# Patient Record
Sex: Female | Born: 1945 | Race: Black or African American | Hispanic: No | Marital: Married | Smoking: Never smoker
Health system: Southern US, Community
[De-identification: ages and names within clinical notes are randomized; demographics above are authoritative.]

## PROBLEM LIST (undated history)

## (undated) DIAGNOSIS — F419 Anxiety disorder, unspecified: Secondary | ICD-10-CM

## (undated) DIAGNOSIS — E119 Type 2 diabetes mellitus without complications: Secondary | ICD-10-CM

## (undated) DIAGNOSIS — I10 Essential (primary) hypertension: Secondary | ICD-10-CM

## (undated) DIAGNOSIS — E039 Hypothyroidism, unspecified: Secondary | ICD-10-CM

## (undated) DIAGNOSIS — E785 Hyperlipidemia, unspecified: Secondary | ICD-10-CM

## (undated) DIAGNOSIS — F32A Depression, unspecified: Secondary | ICD-10-CM

## (undated) DIAGNOSIS — F329 Major depressive disorder, single episode, unspecified: Secondary | ICD-10-CM

## (undated) HISTORY — DX: Depression, unspecified: F32.A

## (undated) HISTORY — DX: Essential (primary) hypertension: I10

## (undated) HISTORY — DX: Anxiety disorder, unspecified: F41.9

## (undated) HISTORY — PX: BREAST BIOPSY: SHX20

## (undated) HISTORY — DX: Type 2 diabetes mellitus without complications: E11.9

## (undated) HISTORY — DX: Hyperlipidemia, unspecified: E78.5

## (undated) HISTORY — DX: Major depressive disorder, single episode, unspecified: F32.9

---

## 2004-09-10 ENCOUNTER — Ambulatory Visit: Payer: Self-pay | Admitting: Family Medicine

## 2005-09-14 ENCOUNTER — Ambulatory Visit: Payer: Self-pay | Admitting: Family Medicine

## 2006-09-22 ENCOUNTER — Ambulatory Visit: Payer: Self-pay | Admitting: Family Medicine

## 2007-10-03 ENCOUNTER — Ambulatory Visit: Payer: Self-pay | Admitting: Internal Medicine

## 2007-10-06 ENCOUNTER — Ambulatory Visit: Payer: Self-pay | Admitting: Internal Medicine

## 2008-12-04 ENCOUNTER — Ambulatory Visit: Payer: Self-pay | Admitting: Internal Medicine

## 2009-12-05 ENCOUNTER — Ambulatory Visit: Payer: Self-pay | Admitting: Internal Medicine

## 2009-12-05 LAB — HM COLONOSCOPY: HM COLON: NORMAL

## 2009-12-22 ENCOUNTER — Ambulatory Visit: Payer: Self-pay | Admitting: Unknown Physician Specialty

## 2010-09-15 ENCOUNTER — Ambulatory Visit: Payer: Self-pay | Admitting: Family Medicine

## 2011-10-05 ENCOUNTER — Ambulatory Visit: Payer: Self-pay | Admitting: Family Medicine

## 2012-01-11 DIAGNOSIS — E039 Hypothyroidism, unspecified: Secondary | ICD-10-CM | POA: Diagnosis not present

## 2012-02-14 DIAGNOSIS — E119 Type 2 diabetes mellitus without complications: Secondary | ICD-10-CM | POA: Diagnosis not present

## 2012-03-07 DIAGNOSIS — Z0000 Encounter for general adult medical examination without abnormal findings: Secondary | ICD-10-CM | POA: Diagnosis not present

## 2012-03-07 DIAGNOSIS — Z23 Encounter for immunization: Secondary | ICD-10-CM | POA: Diagnosis not present

## 2012-03-07 DIAGNOSIS — Z1159 Encounter for screening for other viral diseases: Secondary | ICD-10-CM | POA: Diagnosis not present

## 2012-03-07 DIAGNOSIS — Z1331 Encounter for screening for depression: Secondary | ICD-10-CM | POA: Diagnosis not present

## 2012-03-07 DIAGNOSIS — E039 Hypothyroidism, unspecified: Secondary | ICD-10-CM | POA: Diagnosis not present

## 2012-03-07 DIAGNOSIS — I10 Essential (primary) hypertension: Secondary | ICD-10-CM | POA: Diagnosis not present

## 2012-03-07 DIAGNOSIS — Z124 Encounter for screening for malignant neoplasm of cervix: Secondary | ICD-10-CM | POA: Diagnosis not present

## 2012-03-07 LAB — HM PAP SMEAR: HM Pap smear: NORMAL

## 2012-03-22 ENCOUNTER — Ambulatory Visit: Payer: Self-pay | Admitting: Family Medicine

## 2012-03-22 DIAGNOSIS — N959 Unspecified menopausal and perimenopausal disorder: Secondary | ICD-10-CM | POA: Diagnosis not present

## 2012-03-22 DIAGNOSIS — N951 Menopausal and female climacteric states: Secondary | ICD-10-CM | POA: Diagnosis not present

## 2012-04-07 DIAGNOSIS — E785 Hyperlipidemia, unspecified: Secondary | ICD-10-CM | POA: Diagnosis not present

## 2012-04-07 DIAGNOSIS — I10 Essential (primary) hypertension: Secondary | ICD-10-CM | POA: Diagnosis not present

## 2012-04-07 DIAGNOSIS — E1129 Type 2 diabetes mellitus with other diabetic kidney complication: Secondary | ICD-10-CM | POA: Diagnosis not present

## 2012-08-07 DIAGNOSIS — E039 Hypothyroidism, unspecified: Secondary | ICD-10-CM | POA: Diagnosis not present

## 2012-08-07 DIAGNOSIS — E1129 Type 2 diabetes mellitus with other diabetic kidney complication: Secondary | ICD-10-CM | POA: Diagnosis not present

## 2012-08-07 DIAGNOSIS — I10 Essential (primary) hypertension: Secondary | ICD-10-CM | POA: Diagnosis not present

## 2012-08-07 DIAGNOSIS — E785 Hyperlipidemia, unspecified: Secondary | ICD-10-CM | POA: Diagnosis not present

## 2012-10-05 ENCOUNTER — Ambulatory Visit: Payer: Self-pay | Admitting: Family Medicine

## 2012-10-05 DIAGNOSIS — Z1231 Encounter for screening mammogram for malignant neoplasm of breast: Secondary | ICD-10-CM | POA: Diagnosis not present

## 2012-10-26 DIAGNOSIS — E785 Hyperlipidemia, unspecified: Secondary | ICD-10-CM | POA: Diagnosis not present

## 2012-10-26 DIAGNOSIS — E1129 Type 2 diabetes mellitus with other diabetic kidney complication: Secondary | ICD-10-CM | POA: Diagnosis not present

## 2012-10-26 DIAGNOSIS — I10 Essential (primary) hypertension: Secondary | ICD-10-CM | POA: Diagnosis not present

## 2012-11-08 DIAGNOSIS — E1129 Type 2 diabetes mellitus with other diabetic kidney complication: Secondary | ICD-10-CM | POA: Diagnosis not present

## 2012-12-11 DIAGNOSIS — B0229 Other postherpetic nervous system involvement: Secondary | ICD-10-CM | POA: Diagnosis not present

## 2012-12-11 DIAGNOSIS — Z1331 Encounter for screening for depression: Secondary | ICD-10-CM | POA: Diagnosis not present

## 2012-12-11 DIAGNOSIS — R439 Unspecified disturbances of smell and taste: Secondary | ICD-10-CM | POA: Diagnosis not present

## 2012-12-11 DIAGNOSIS — F341 Dysthymic disorder: Secondary | ICD-10-CM | POA: Diagnosis not present

## 2013-01-03 DIAGNOSIS — R439 Unspecified disturbances of smell and taste: Secondary | ICD-10-CM | POA: Diagnosis not present

## 2013-01-03 DIAGNOSIS — H6120 Impacted cerumen, unspecified ear: Secondary | ICD-10-CM | POA: Diagnosis not present

## 2013-01-24 DIAGNOSIS — R439 Unspecified disturbances of smell and taste: Secondary | ICD-10-CM | POA: Diagnosis not present

## 2013-02-15 DIAGNOSIS — E119 Type 2 diabetes mellitus without complications: Secondary | ICD-10-CM | POA: Diagnosis not present

## 2013-02-27 DIAGNOSIS — Z23 Encounter for immunization: Secondary | ICD-10-CM | POA: Diagnosis not present

## 2013-02-27 DIAGNOSIS — I10 Essential (primary) hypertension: Secondary | ICD-10-CM | POA: Diagnosis not present

## 2013-02-27 DIAGNOSIS — E1129 Type 2 diabetes mellitus with other diabetic kidney complication: Secondary | ICD-10-CM | POA: Diagnosis not present

## 2013-02-27 DIAGNOSIS — N058 Unspecified nephritic syndrome with other morphologic changes: Secondary | ICD-10-CM | POA: Diagnosis not present

## 2013-04-26 DIAGNOSIS — R439 Unspecified disturbances of smell and taste: Secondary | ICD-10-CM | POA: Diagnosis not present

## 2013-05-07 ENCOUNTER — Ambulatory Visit: Payer: Self-pay | Admitting: Unknown Physician Specialty

## 2013-05-07 DIAGNOSIS — R439 Unspecified disturbances of smell and taste: Secondary | ICD-10-CM | POA: Diagnosis not present

## 2013-05-07 DIAGNOSIS — R930 Abnormal findings on diagnostic imaging of skull and head, not elsewhere classified: Secondary | ICD-10-CM | POA: Diagnosis not present

## 2013-05-07 LAB — CREATININE, SERUM
Creatinine: 1.28 mg/dL (ref 0.60–1.30)
EGFR (African American): 50 — ABNORMAL LOW
EGFR (Non-African Amer.): 44 — ABNORMAL LOW

## 2013-06-12 DIAGNOSIS — I10 Essential (primary) hypertension: Secondary | ICD-10-CM | POA: Diagnosis not present

## 2013-06-12 DIAGNOSIS — E1129 Type 2 diabetes mellitus with other diabetic kidney complication: Secondary | ICD-10-CM | POA: Diagnosis not present

## 2013-06-12 DIAGNOSIS — F341 Dysthymic disorder: Secondary | ICD-10-CM | POA: Diagnosis not present

## 2013-06-12 DIAGNOSIS — E039 Hypothyroidism, unspecified: Secondary | ICD-10-CM | POA: Diagnosis not present

## 2013-06-12 DIAGNOSIS — E785 Hyperlipidemia, unspecified: Secondary | ICD-10-CM | POA: Diagnosis not present

## 2013-06-12 DIAGNOSIS — N058 Unspecified nephritic syndrome with other morphologic changes: Secondary | ICD-10-CM | POA: Diagnosis not present

## 2013-09-06 LAB — HM DEXA SCAN: HM Dexa Scan: NORMAL

## 2013-10-09 ENCOUNTER — Ambulatory Visit: Payer: Self-pay | Admitting: Family Medicine

## 2013-10-09 DIAGNOSIS — Z1231 Encounter for screening mammogram for malignant neoplasm of breast: Secondary | ICD-10-CM | POA: Diagnosis not present

## 2013-10-09 LAB — HM MAMMOGRAPHY: HM Mammogram: NORMAL

## 2013-10-10 DIAGNOSIS — E039 Hypothyroidism, unspecified: Secondary | ICD-10-CM | POA: Diagnosis not present

## 2013-10-10 DIAGNOSIS — R059 Cough, unspecified: Secondary | ICD-10-CM | POA: Diagnosis not present

## 2013-10-10 DIAGNOSIS — R6881 Early satiety: Secondary | ICD-10-CM | POA: Diagnosis not present

## 2013-10-10 DIAGNOSIS — E1129 Type 2 diabetes mellitus with other diabetic kidney complication: Secondary | ICD-10-CM | POA: Diagnosis not present

## 2013-10-10 DIAGNOSIS — R110 Nausea: Secondary | ICD-10-CM | POA: Diagnosis not present

## 2013-10-10 DIAGNOSIS — R05 Cough: Secondary | ICD-10-CM | POA: Diagnosis not present

## 2013-10-12 DIAGNOSIS — E1129 Type 2 diabetes mellitus with other diabetic kidney complication: Secondary | ICD-10-CM | POA: Diagnosis not present

## 2013-10-12 DIAGNOSIS — N058 Unspecified nephritic syndrome with other morphologic changes: Secondary | ICD-10-CM | POA: Diagnosis not present

## 2013-10-12 DIAGNOSIS — I10 Essential (primary) hypertension: Secondary | ICD-10-CM | POA: Diagnosis not present

## 2013-11-02 DIAGNOSIS — E119 Type 2 diabetes mellitus without complications: Secondary | ICD-10-CM | POA: Diagnosis not present

## 2013-11-02 DIAGNOSIS — I10 Essential (primary) hypertension: Secondary | ICD-10-CM | POA: Diagnosis not present

## 2013-11-02 DIAGNOSIS — N179 Acute kidney failure, unspecified: Secondary | ICD-10-CM | POA: Diagnosis not present

## 2013-11-02 DIAGNOSIS — N170 Acute kidney failure with tubular necrosis: Secondary | ICD-10-CM | POA: Diagnosis not present

## 2013-11-02 DIAGNOSIS — N200 Calculus of kidney: Secondary | ICD-10-CM | POA: Diagnosis not present

## 2013-11-06 ENCOUNTER — Ambulatory Visit: Payer: Self-pay | Admitting: Nephrology

## 2013-11-06 DIAGNOSIS — N200 Calculus of kidney: Secondary | ICD-10-CM | POA: Diagnosis not present

## 2013-11-06 DIAGNOSIS — N183 Chronic kidney disease, stage 3 unspecified: Secondary | ICD-10-CM | POA: Diagnosis not present

## 2013-11-06 DIAGNOSIS — N189 Chronic kidney disease, unspecified: Secondary | ICD-10-CM | POA: Diagnosis not present

## 2013-11-16 DIAGNOSIS — N179 Acute kidney failure, unspecified: Secondary | ICD-10-CM | POA: Diagnosis not present

## 2013-11-16 DIAGNOSIS — N200 Calculus of kidney: Secondary | ICD-10-CM | POA: Diagnosis not present

## 2013-11-16 DIAGNOSIS — E119 Type 2 diabetes mellitus without complications: Secondary | ICD-10-CM | POA: Diagnosis not present

## 2013-11-28 DIAGNOSIS — N200 Calculus of kidney: Secondary | ICD-10-CM | POA: Diagnosis not present

## 2013-11-28 DIAGNOSIS — R109 Unspecified abdominal pain: Secondary | ICD-10-CM | POA: Diagnosis not present

## 2013-12-05 HISTORY — PX: LITHOTRIPSY: SUR834

## 2013-12-10 ENCOUNTER — Ambulatory Visit: Payer: Self-pay | Admitting: Urology

## 2013-12-10 DIAGNOSIS — N200 Calculus of kidney: Secondary | ICD-10-CM | POA: Diagnosis not present

## 2013-12-20 DIAGNOSIS — N200 Calculus of kidney: Secondary | ICD-10-CM | POA: Diagnosis not present

## 2013-12-20 DIAGNOSIS — Z01818 Encounter for other preprocedural examination: Secondary | ICD-10-CM | POA: Diagnosis not present

## 2014-01-01 ENCOUNTER — Ambulatory Visit: Payer: Self-pay | Admitting: Urology

## 2014-01-01 DIAGNOSIS — E119 Type 2 diabetes mellitus without complications: Secondary | ICD-10-CM | POA: Diagnosis not present

## 2014-01-01 DIAGNOSIS — Z01812 Encounter for preprocedural laboratory examination: Secondary | ICD-10-CM | POA: Diagnosis not present

## 2014-01-01 DIAGNOSIS — Z01810 Encounter for preprocedural cardiovascular examination: Secondary | ICD-10-CM | POA: Diagnosis not present

## 2014-01-01 DIAGNOSIS — I10 Essential (primary) hypertension: Secondary | ICD-10-CM | POA: Diagnosis not present

## 2014-01-01 DIAGNOSIS — N200 Calculus of kidney: Secondary | ICD-10-CM | POA: Diagnosis not present

## 2014-01-01 LAB — BASIC METABOLIC PANEL
ANION GAP: 7 (ref 7–16)
BUN: 16 mg/dL (ref 7–18)
CREATININE: 0.9 mg/dL (ref 0.60–1.30)
Calcium, Total: 9.2 mg/dL (ref 8.5–10.1)
Chloride: 103 mmol/L (ref 98–107)
Co2: 27 mmol/L (ref 21–32)
Glucose: 71 mg/dL (ref 65–99)
OSMOLALITY: 273 (ref 275–301)
Potassium: 3.7 mmol/L (ref 3.5–5.1)
Sodium: 137 mmol/L (ref 136–145)

## 2014-01-03 ENCOUNTER — Ambulatory Visit: Payer: Self-pay | Admitting: Urology

## 2014-01-03 DIAGNOSIS — Z882 Allergy status to sulfonamides status: Secondary | ICD-10-CM | POA: Diagnosis not present

## 2014-01-03 DIAGNOSIS — I129 Hypertensive chronic kidney disease with stage 1 through stage 4 chronic kidney disease, or unspecified chronic kidney disease: Secondary | ICD-10-CM | POA: Diagnosis not present

## 2014-01-03 DIAGNOSIS — F411 Generalized anxiety disorder: Secondary | ICD-10-CM | POA: Diagnosis not present

## 2014-01-03 DIAGNOSIS — N200 Calculus of kidney: Secondary | ICD-10-CM | POA: Diagnosis not present

## 2014-01-03 DIAGNOSIS — E039 Hypothyroidism, unspecified: Secondary | ICD-10-CM | POA: Diagnosis not present

## 2014-01-03 DIAGNOSIS — Z79899 Other long term (current) drug therapy: Secondary | ICD-10-CM | POA: Diagnosis not present

## 2014-01-03 DIAGNOSIS — E119 Type 2 diabetes mellitus without complications: Secondary | ICD-10-CM | POA: Diagnosis not present

## 2014-01-03 DIAGNOSIS — N189 Chronic kidney disease, unspecified: Secondary | ICD-10-CM | POA: Diagnosis not present

## 2014-01-10 ENCOUNTER — Ambulatory Visit: Payer: Self-pay | Admitting: Urology

## 2014-01-10 DIAGNOSIS — N201 Calculus of ureter: Secondary | ICD-10-CM | POA: Diagnosis not present

## 2014-01-10 DIAGNOSIS — N200 Calculus of kidney: Secondary | ICD-10-CM | POA: Diagnosis not present

## 2014-01-30 DIAGNOSIS — Z23 Encounter for immunization: Secondary | ICD-10-CM | POA: Diagnosis not present

## 2014-01-30 DIAGNOSIS — E1129 Type 2 diabetes mellitus with other diabetic kidney complication: Secondary | ICD-10-CM | POA: Diagnosis not present

## 2014-01-30 DIAGNOSIS — N058 Unspecified nephritic syndrome with other morphologic changes: Secondary | ICD-10-CM | POA: Diagnosis not present

## 2014-01-30 DIAGNOSIS — N183 Chronic kidney disease, stage 3 unspecified: Secondary | ICD-10-CM | POA: Diagnosis not present

## 2014-02-12 ENCOUNTER — Ambulatory Visit: Payer: Self-pay | Admitting: Urology

## 2014-02-12 DIAGNOSIS — N201 Calculus of ureter: Secondary | ICD-10-CM | POA: Diagnosis not present

## 2014-02-12 DIAGNOSIS — Z01818 Encounter for other preprocedural examination: Secondary | ICD-10-CM | POA: Diagnosis not present

## 2014-02-12 DIAGNOSIS — N200 Calculus of kidney: Secondary | ICD-10-CM | POA: Diagnosis not present

## 2014-02-12 DIAGNOSIS — R109 Unspecified abdominal pain: Secondary | ICD-10-CM | POA: Diagnosis not present

## 2014-02-18 DIAGNOSIS — E119 Type 2 diabetes mellitus without complications: Secondary | ICD-10-CM | POA: Diagnosis not present

## 2014-02-28 ENCOUNTER — Ambulatory Visit: Payer: Self-pay | Admitting: Urology

## 2014-02-28 DIAGNOSIS — N201 Calculus of ureter: Secondary | ICD-10-CM | POA: Diagnosis not present

## 2014-02-28 DIAGNOSIS — E039 Hypothyroidism, unspecified: Secondary | ICD-10-CM | POA: Diagnosis not present

## 2014-02-28 DIAGNOSIS — N200 Calculus of kidney: Secondary | ICD-10-CM | POA: Diagnosis not present

## 2014-02-28 DIAGNOSIS — I10 Essential (primary) hypertension: Secondary | ICD-10-CM | POA: Diagnosis not present

## 2014-02-28 DIAGNOSIS — I129 Hypertensive chronic kidney disease with stage 1 through stage 4 chronic kidney disease, or unspecified chronic kidney disease: Secondary | ICD-10-CM | POA: Diagnosis not present

## 2014-02-28 DIAGNOSIS — N189 Chronic kidney disease, unspecified: Secondary | ICD-10-CM | POA: Diagnosis not present

## 2014-02-28 DIAGNOSIS — E119 Type 2 diabetes mellitus without complications: Secondary | ICD-10-CM | POA: Diagnosis not present

## 2014-02-28 DIAGNOSIS — Z8489 Family history of other specified conditions: Secondary | ICD-10-CM | POA: Diagnosis not present

## 2014-03-07 ENCOUNTER — Ambulatory Visit: Payer: Self-pay

## 2014-03-07 DIAGNOSIS — Z09 Encounter for follow-up examination after completed treatment for conditions other than malignant neoplasm: Secondary | ICD-10-CM | POA: Diagnosis not present

## 2014-03-07 DIAGNOSIS — N200 Calculus of kidney: Secondary | ICD-10-CM | POA: Diagnosis not present

## 2014-03-07 DIAGNOSIS — Z9889 Other specified postprocedural states: Secondary | ICD-10-CM | POA: Diagnosis not present

## 2014-03-20 DIAGNOSIS — N200 Calculus of kidney: Secondary | ICD-10-CM | POA: Diagnosis not present

## 2014-03-20 DIAGNOSIS — I129 Hypertensive chronic kidney disease with stage 1 through stage 4 chronic kidney disease, or unspecified chronic kidney disease: Secondary | ICD-10-CM | POA: Diagnosis not present

## 2014-03-20 DIAGNOSIS — E785 Hyperlipidemia, unspecified: Secondary | ICD-10-CM | POA: Diagnosis not present

## 2014-03-20 DIAGNOSIS — I10 Essential (primary) hypertension: Secondary | ICD-10-CM | POA: Diagnosis not present

## 2014-03-20 DIAGNOSIS — E119 Type 2 diabetes mellitus without complications: Secondary | ICD-10-CM | POA: Diagnosis not present

## 2014-03-20 DIAGNOSIS — E039 Hypothyroidism, unspecified: Secondary | ICD-10-CM | POA: Diagnosis not present

## 2014-03-25 DIAGNOSIS — N950 Postmenopausal bleeding: Secondary | ICD-10-CM | POA: Diagnosis not present

## 2014-03-25 DIAGNOSIS — Z1389 Encounter for screening for other disorder: Secondary | ICD-10-CM | POA: Diagnosis not present

## 2014-03-25 DIAGNOSIS — Z9181 History of falling: Secondary | ICD-10-CM | POA: Diagnosis not present

## 2014-03-25 DIAGNOSIS — E785 Hyperlipidemia, unspecified: Secondary | ICD-10-CM | POA: Diagnosis not present

## 2014-03-25 DIAGNOSIS — F418 Other specified anxiety disorders: Secondary | ICD-10-CM | POA: Diagnosis not present

## 2014-03-25 DIAGNOSIS — E1121 Type 2 diabetes mellitus with diabetic nephropathy: Secondary | ICD-10-CM | POA: Diagnosis not present

## 2014-03-25 DIAGNOSIS — Z0000 Encounter for general adult medical examination without abnormal findings: Secondary | ICD-10-CM | POA: Diagnosis not present

## 2014-04-09 ENCOUNTER — Ambulatory Visit: Payer: Self-pay

## 2014-04-09 DIAGNOSIS — N200 Calculus of kidney: Secondary | ICD-10-CM | POA: Diagnosis not present

## 2014-04-17 DIAGNOSIS — N898 Other specified noninflammatory disorders of vagina: Secondary | ICD-10-CM | POA: Diagnosis not present

## 2014-04-23 DIAGNOSIS — N898 Other specified noninflammatory disorders of vagina: Secondary | ICD-10-CM | POA: Diagnosis not present

## 2014-05-07 DIAGNOSIS — R809 Proteinuria, unspecified: Secondary | ICD-10-CM | POA: Diagnosis not present

## 2014-05-07 DIAGNOSIS — E039 Hypothyroidism, unspecified: Secondary | ICD-10-CM | POA: Diagnosis not present

## 2014-05-07 DIAGNOSIS — I10 Essential (primary) hypertension: Secondary | ICD-10-CM | POA: Diagnosis not present

## 2014-05-07 DIAGNOSIS — E1121 Type 2 diabetes mellitus with diabetic nephropathy: Secondary | ICD-10-CM | POA: Diagnosis not present

## 2014-05-07 DIAGNOSIS — F418 Other specified anxiety disorders: Secondary | ICD-10-CM | POA: Diagnosis not present

## 2014-05-07 DIAGNOSIS — N183 Chronic kidney disease, stage 3 (moderate): Secondary | ICD-10-CM | POA: Diagnosis not present

## 2014-08-08 DIAGNOSIS — E785 Hyperlipidemia, unspecified: Secondary | ICD-10-CM | POA: Diagnosis not present

## 2014-08-08 DIAGNOSIS — R809 Proteinuria, unspecified: Secondary | ICD-10-CM | POA: Diagnosis not present

## 2014-08-08 DIAGNOSIS — N183 Chronic kidney disease, stage 3 (moderate): Secondary | ICD-10-CM | POA: Diagnosis not present

## 2014-08-08 DIAGNOSIS — Z23 Encounter for immunization: Secondary | ICD-10-CM | POA: Diagnosis not present

## 2014-08-08 DIAGNOSIS — I10 Essential (primary) hypertension: Secondary | ICD-10-CM | POA: Diagnosis not present

## 2014-08-08 DIAGNOSIS — M7551 Bursitis of right shoulder: Secondary | ICD-10-CM | POA: Diagnosis not present

## 2014-08-08 DIAGNOSIS — E039 Hypothyroidism, unspecified: Secondary | ICD-10-CM | POA: Diagnosis not present

## 2014-08-08 DIAGNOSIS — E1121 Type 2 diabetes mellitus with diabetic nephropathy: Secondary | ICD-10-CM | POA: Diagnosis not present

## 2014-08-08 LAB — BASIC METABOLIC PANEL
Creatinine: 1.1 mg/dL (ref <=1.1)
Glucose: 104 mg/dL

## 2014-08-08 LAB — TSH: TSH: 4.41 u[IU]/mL (ref <=5.90)

## 2014-08-08 LAB — LIPID PANEL
CHOLESTEROL: 127 mg/dL (ref 0–200)
HDL: 35 mg/dL (ref 35–70)
LDL CALC: 72 mg/dL
Triglycerides: 98 mg/dL (ref 40–160)

## 2014-08-08 LAB — HEPATIC FUNCTION PANEL
ALK PHOS: 133 U/L — AB (ref 25–125)
ALT: 15 U/L (ref 7–35)
AST: 36 U/L — AB (ref 13–35)

## 2014-08-08 LAB — HEMOGLOBIN A1C: Hgb A1c MFr Bld: 6.8 % — AB (ref 4.0–6.0)

## 2014-09-07 DIAGNOSIS — E785 Hyperlipidemia, unspecified: Secondary | ICD-10-CM | POA: Insufficient documentation

## 2014-09-07 DIAGNOSIS — E559 Vitamin D deficiency, unspecified: Secondary | ICD-10-CM | POA: Insufficient documentation

## 2014-09-07 DIAGNOSIS — R110 Nausea: Secondary | ICD-10-CM | POA: Insufficient documentation

## 2014-09-07 DIAGNOSIS — F325 Major depressive disorder, single episode, in full remission: Secondary | ICD-10-CM | POA: Insufficient documentation

## 2014-09-07 DIAGNOSIS — R413 Other amnesia: Secondary | ICD-10-CM | POA: Insufficient documentation

## 2014-09-07 DIAGNOSIS — N183 Chronic kidney disease, stage 3 unspecified: Secondary | ICD-10-CM | POA: Insufficient documentation

## 2014-09-07 DIAGNOSIS — E1129 Type 2 diabetes mellitus with other diabetic kidney complication: Secondary | ICD-10-CM | POA: Insufficient documentation

## 2014-09-07 DIAGNOSIS — E039 Hypothyroidism, unspecified: Secondary | ICD-10-CM | POA: Insufficient documentation

## 2014-09-07 DIAGNOSIS — R809 Proteinuria, unspecified: Secondary | ICD-10-CM | POA: Insufficient documentation

## 2014-09-07 DIAGNOSIS — B0229 Other postherpetic nervous system involvement: Secondary | ICD-10-CM | POA: Insufficient documentation

## 2014-09-07 DIAGNOSIS — N1831 Chronic kidney disease, stage 3a: Secondary | ICD-10-CM | POA: Insufficient documentation

## 2014-09-07 DIAGNOSIS — R430 Anosmia: Secondary | ICD-10-CM | POA: Insufficient documentation

## 2014-09-07 DIAGNOSIS — I10 Essential (primary) hypertension: Secondary | ICD-10-CM | POA: Insufficient documentation

## 2014-09-07 DIAGNOSIS — N200 Calculus of kidney: Secondary | ICD-10-CM | POA: Insufficient documentation

## 2014-09-13 DIAGNOSIS — N200 Calculus of kidney: Secondary | ICD-10-CM | POA: Diagnosis not present

## 2014-09-13 DIAGNOSIS — E119 Type 2 diabetes mellitus without complications: Secondary | ICD-10-CM | POA: Diagnosis not present

## 2014-09-13 DIAGNOSIS — I10 Essential (primary) hypertension: Secondary | ICD-10-CM | POA: Diagnosis not present

## 2014-09-20 ENCOUNTER — Other Ambulatory Visit: Payer: Self-pay | Admitting: Family Medicine

## 2014-09-20 DIAGNOSIS — Z1239 Encounter for other screening for malignant neoplasm of breast: Secondary | ICD-10-CM

## 2014-10-14 ENCOUNTER — Other Ambulatory Visit: Payer: Self-pay | Admitting: Family Medicine

## 2014-10-14 ENCOUNTER — Ambulatory Visit
Admission: RE | Admit: 2014-10-14 | Discharge: 2014-10-14 | Disposition: A | Discharge status: Home / Self Care | Payer: Medicare Other | Source: Ambulatory Visit | Attending: Family Medicine | Admitting: Family Medicine

## 2014-10-14 DIAGNOSIS — Z1231 Encounter for screening mammogram for malignant neoplasm of breast: Secondary | ICD-10-CM | POA: Diagnosis present

## 2014-10-14 DIAGNOSIS — Z1239 Encounter for other screening for malignant neoplasm of breast: Secondary | ICD-10-CM

## 2014-10-18 DIAGNOSIS — B309 Viral conjunctivitis, unspecified: Secondary | ICD-10-CM | POA: Diagnosis not present

## 2014-11-12 ENCOUNTER — Ambulatory Visit (INDEPENDENT_AMBULATORY_CARE_PROVIDER_SITE_OTHER): Payer: Medicare Other | Admitting: Family Medicine

## 2014-11-12 ENCOUNTER — Encounter (INDEPENDENT_AMBULATORY_CARE_PROVIDER_SITE_OTHER): Payer: Self-pay

## 2014-11-12 ENCOUNTER — Encounter: Payer: Self-pay | Admitting: Family Medicine

## 2014-11-12 VITALS — BP 118/78 | HR 87 | Temp 97.7°F | Resp 14 | Ht 59.5 in | Wt 153.0 lb

## 2014-11-12 DIAGNOSIS — F418 Other specified anxiety disorders: Secondary | ICD-10-CM | POA: Diagnosis not present

## 2014-11-12 DIAGNOSIS — E039 Hypothyroidism, unspecified: Secondary | ICD-10-CM

## 2014-11-12 DIAGNOSIS — F32A Depression, unspecified: Secondary | ICD-10-CM

## 2014-11-12 DIAGNOSIS — F329 Major depressive disorder, single episode, unspecified: Secondary | ICD-10-CM

## 2014-11-12 DIAGNOSIS — R809 Proteinuria, unspecified: Secondary | ICD-10-CM

## 2014-11-12 DIAGNOSIS — E669 Obesity, unspecified: Secondary | ICD-10-CM | POA: Diagnosis not present

## 2014-11-12 DIAGNOSIS — E1129 Type 2 diabetes mellitus with other diabetic kidney complication: Secondary | ICD-10-CM | POA: Diagnosis not present

## 2014-11-12 DIAGNOSIS — E66811 Obesity, class 1: Secondary | ICD-10-CM | POA: Insufficient documentation

## 2014-11-12 DIAGNOSIS — I10 Essential (primary) hypertension: Secondary | ICD-10-CM | POA: Diagnosis not present

## 2014-11-12 DIAGNOSIS — E785 Hyperlipidemia, unspecified: Secondary | ICD-10-CM

## 2014-11-12 DIAGNOSIS — F419 Anxiety disorder, unspecified: Secondary | ICD-10-CM

## 2014-11-12 LAB — POCT GLYCOSYLATED HEMOGLOBIN (HGB A1C): HEMOGLOBIN A1C: 6.5

## 2014-11-12 MED ORDER — LOSARTAN POTASSIUM 100 MG PO TABS: 100.0000 mg | ORAL_TABLET | Freq: Every day | ORAL | Status: DC

## 2014-11-12 MED ORDER — AMLODIPINE BESYLATE 2.5 MG PO TABS: 2.5000 mg | ORAL_TABLET | Freq: Every day | ORAL | Status: DC

## 2014-11-12 MED ORDER — CLONAZEPAM 0.5 MG PO TABS: 0.5000 mg | ORAL_TABLET | Freq: Two times a day (BID) | ORAL | Status: DC | PRN

## 2014-11-12 MED ORDER — LEVOTHYROXINE SODIUM 75 MCG PO TABS: 75.0000 ug | ORAL_TABLET | Freq: Every day | ORAL | Status: DC

## 2014-11-12 NOTE — Progress Notes (Signed)
Name: Megan Short   MRN: [id number]    DOB: 1945-09-26   Date:11/12/2014       Progress Note  Subjective  Chief Complaint  Chief Complaint  Patient presents with  . Medication Refill    3 month F/U  . Diabetes    Checks Sugar once a daily Low-89 Average-100 High-140  . Hyperlipidemia  . Hypertension    Checks BP at home with a brachial cuff 120/80  . Anxiety    Improving    HPI  DM: taking losartan, metformin and glipizide. No side effects, hgbA1C is coming down, denies hypoglycemic episodes. She has been compliant with her diet. 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 BP is at goal  Hyperlipidemia: LDL is at goal, reviewed labs, continue Atorvastatin, denies side effects of medications  Obesity: lost 6 lbs over the past 3 months, eating breakfast and stopped sodas.  She has also been more active.  Hypothyroidism: Denies dry skin, constipation, no fatigue, taking medication as prescribed.     Patient Active Problem List   Diagnosis Date Noted  . Chronic kidney disease (CKD), stage III (moderate) 09/07/2014  . Diabetes mellitus with renal manifestations, controlled 09/07/2014  . Adult hypothyroidism 09/07/2014  . Vitamin D deficiency 09/07/2014  . Benign essential HTN 09/07/2014  . Microalbuminuria 09/07/2014  . Dyslipidemia 09/07/2014  . Anxiety and depression 09/07/2014  . 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/07/2014  . Calculus of kidney 09/07/2014  . Memory loss or impairment 09/07/2014    Past Surgical History  Procedure Laterality Date  . Lithotripsy Left 12/2013  . Breast biopsy Right     negative times 2    Family History  Problem Relation Age of Onset  . Osteoarthritis Mother   . Osteoarthritis Sister   . Alzheimer's disease Maternal Aunt     History   Social History  . Marital Status: Married    Spouse Name: N/A  . Number of Children: N/A  . Years of Education: N/A   Occupational History  . Not on file.   Social History Main Topics  . Smoking status: Never Smoker   . Smokeless tobacco: Not on file  . Alcohol Use: No  . Drug Use: No  . Sexual Activity: No   Other Topics Concern  . Not on file   Social History Narrative     Current outpatient prescriptions:  .  amLODipine (NORVASC) 2.5 MG tablet, Take by mouth., Disp: , Rfl:  .  aspirin 81 MG tablet, Take by mouth., Disp: , Rfl:  .  atorvastatin (LIPITOR) 80 MG tablet, Take by mouth., Disp: , Rfl:  .  citalopram (CELEXA) 40 MG tablet, Take by mouth., Disp: , Rfl:  .  clonazePAM (KLONOPIN) 0.5 MG tablet, Take by mouth., Disp: , Rfl:  .  glipiZIDE (GLUCOTROL XL) 5 MG 24 hr tablet, Take by mouth., Disp: , Rfl:  .  Glucose Blood DISK, BAYER BREEZE 2 TEST (In Vitro Disk)  1 Disk check fsbs daily for 90 days  Quantity: 10;  Refills: 1   Ordered :07-Apr-2012  Alba Cory MD;  Started 07-Apr-2012 Active, Disp: , Rfl:  .  levothyroxine (SYNTHROID, LEVOTHROID) 75 MCG tablet, Take by mouth., Disp: , Rfl:  .  losartan (COZAAR) 100 MG tablet, Take by mouth., Disp: , Rfl:  .  metFORMIN (GLUCOPHAGE) 500 MG tablet, Take by  mouth., Disp: , Rfl:  .  Vitamin D, Ergocalciferol, (DRISDOL) 50000 UNITS CAPS capsule, Take by mouth., Disp: , Rfl:   Allergies  Allergen Reactions  . Sulfa Antibiotics      ROS  Constitutional: Negative for fever, she has lost 6 lbs since last visit.  Respiratory: Negative for cough and shortness of breath.   Cardiovascular: Negative for chest pain or palpitations.  Gastrointestinal: Negative for abdominal pain, no bowel changes. , but she has occasional nausea, and decrease in appetite since she started eating breakfast Musculoskeletal: Negative for gait problem or joint swelling.  Skin: Negative for rash.  Neurological: Negative for dizziness or headache. She still feels like occasionally she forgets  things at times. No other specific complaints in a complete review of systems (except as listed in HPI above).  Objective  Filed Vitals:   11/12/14 0827  BP: 118/78  Pulse: 87  Temp: 97.7 F (36.5 C)  TempSrc: Oral  Resp: 14  Height: 4' 11.5" (1.511 m)  Weight: 153 lb (69.4 kg)  SpO2: 97%    Physical Exam  Constitutional: Patient appears well-developed and well-nourished. No distress.  HENT: Head: Normocephalic and atraumatic.  Nose: Nose normal. Mouth/Throat: Oropharynx is clear and moist. No oropharyngeal exudate.  Eyes: Conjunctivae and EOM are normal. Pupils are equal, round, and reactive to light. No scleral icterus.  Neck: Normal range of motion. Neck supple. No JVD present. No thyromegaly present.  Cardiovascular: Normal rate, regular rhythm and normal heart sounds.  No murmur heard. No BLE edema. Pulmonary/Chest: Effort normal and breath sounds normal. No respiratory distress. Abdominal: Soft. Bowel sounds are normal, no distension. There is no tenderness. no masses Musculoskeletal: Normal range of motion, no joint effusions. No gross deformities Neurological: he is alert and oriented to person, place, and time. No cranial nerve deficit. Coordination, balance, strength, speech and gait are normal.  Skin: Skin is warm and dry. No rash noted. No erythema. Thick toe nails Psychiatric: Patient has a normal mood and affect. behavior is normal. Judgment and thought content normal.   Recent Results (from the past 2160 hour(s))  POCT HgB A1C     Status: None   Collection Time: 11/12/14  8:32 AM  Result Value Ref Range   Hemoglobin A1C 6.5    Depression screen PHQ 2/9 11/12/2014  Decreased Interest 0  Down, Depressed, Hopeless 0  PHQ - 2 Score 0   Diabetic Foot Exam - Simple   Simple Foot Form  Visual Inspection  See comments:  Yes  Sensation Testing  Intact to touch and monofilament testing bilaterally:  Yes  Pulse Check  Posterior Tibialis and Dorsalis pulse intact  bilaterally:  Yes  Comments  Thick toe nails     Fall Risk: Fall Risk  11/12/2014  Falls in the past year? No     Assessment & Plan  1. Type 2 diabetes mellitus with other diabetic kidney complication Doing well, hgbA1C is coming down - POCT HgB A1C  2. Microalbuminuria Last result was in march and it was elevated, continue losartan  3. Hypothyroidism, unspecified hypothyroidism type Last labs TSH was 4.410 we will recheck levels today - TSH - levothyroxine (SYNTHROID, LEVOTHROID) 75 MCG tablet; Take 1 tablet (75 mcg total) by mouth daily before breakfast.  Dispense: 90 tablet; Refill: 1  4. Dyslipidemia Continue Atorvastatin, LDL is at goal , HDL has to improve, discussed physical activity, eat fish two times weekly and tree nuts three times weekly   5. Essential hypertension At  goal, continue medication - losartan (COZAAR) 100 MG tablet; Take 1 tablet (100 mg total) by mouth daily.  Dispense: 90 tablet; Refill: 1 - amLODipine (NORVASC) 2.5 MG tablet; Take 1 tablet (2.5 mg total) by mouth daily.  Dispense: 90 tablet; Refill: 1  6. Anxiety and depression Stable with medication - clonazePAM (KLONOPIN) 0.5 MG tablet; Take 1 tablet (0.5 mg total) by mouth 2 (two) times daily as needed for anxiety.  Dispense: 30 tablet; Refill: 0  7. Obesity, Class I, BMI 30-34.9 She lost 6 lbs, stopped sodas, eating breakfast, but also states gets full easily, she is not concerned about it, states she changed her life style, however if continues to lose weight or develops symptoms of early abdominal pain, indigestion, vomiting, needs to let me know to look for other causes of weight loss

## 2014-11-12 NOTE — Patient Instructions (Signed)
Take thyroid medication by itself in am's, without food or other medications  Discussed importance of 150 minutes of physical activity weekly, eat two servings of fish weekly, eat one serving of tree nuts ( cashews, pistachios, pecans, almonds.Marland Kitchen.) every other day, eat 6 servings of fruit/vegetables daily and drink plenty of water and avoid sweet beverages.    Obesity Obesity is defined as having too much total body fat and a body mass index (BMI) of 30 or more. BMI is an estimate of body fat and is calculated from your height and weight. Obesity happens when you consume more calories than you can burn by exercising or performing daily physical tasks. Prolonged obesity can cause major illnesses or emergencies, such as:   Stroke.  Heart disease.  Diabetes.  Cancer.  Arthritis.  High blood pressure (hypertension).  High cholesterol.  Sleep apnea.  Erectile dysfunction.  Infertility problems. CAUSES   Regularly eating unhealthy foods.  Physical inactivity.  Certain disorders, such as an underactive thyroid (hypothyroidism), Cushing's syndrome, and polycystic ovarian syndrome.  Certain medicines, such as steroids, some depression medicines, and antipsychotics.  Genetics.  Lack of sleep. DIAGNOSIS  A health care provider can diagnose obesity after calculating your BMI. Obesity will be diagnosed if your BMI is 30 or higher.  There are other methods of measuring obesity levels. Some other methods include measuring your skinfold thickness, your waist circumference, and comparing your hip circumference to your waist circumference. TREATMENT  A healthy treatment program includes some or all of the following:  Long-term dietary changes.  Exercise and physical activity.  Behavioral and lifestyle changes.  Medicine only under the supervision of your health care provider. Medicines may help, but only if they are used with diet and exercise programs. An unhealthy treatment  program includes:  Fasting.  Fad diets.  Supplements and drugs. These choices do not succeed in long-term weight control.  HOME CARE INSTRUCTIONS   Exercise and perform physical activity as directed by your health care provider. To increase physical activity, try the following:  Use stairs instead of elevators.  Park farther away from store entrances.  Garden, bike, or walk instead of watching television or using the computer.  Eat healthy, low-calorie foods and drinks on a regular basis. Eat more fruits and vegetables. Use low-calorie cookbooks or take healthy cooking classes.  Limit fast food, sweets, and processed snack foods.  Eat smaller portions.  Keep a daily journal of everything you eat. There are many free websites to help you with this. It may be helpful to measure your foods so you can determine if you are eating the correct portion sizes.  Avoid drinking alcohol. Drink more water and drinks without calories.  Take vitamins and supplements only as recommended by your health care provider.  Weight-loss support groups, Government social research officerregistered dietitians, counselors, and stress reduction education can also be very helpful. SEEK IMMEDIATE MEDICAL CARE IF:  You have chest pain or tightness.  You have trouble breathing or feel short of breath.  You have weakness or leg numbness.  You feel confused or have trouble talking.  You have sudden changes in your vision. MAKE SURE YOU:  Understand these instructions.  Will watch your condition.  Will get help right away if you are not doing well or get worse. Document Released: 07/01/2004 Document Revised: 10/08/2013 Document Reviewed: 06/30/2011 [facility] Patient Information 2015 [employer] [place]. This information is not intended to replace advice given to you by your health care provider. Make sure you discuss any questions  you have with your health care provider.

## 2014-11-13 ENCOUNTER — Telehealth: Payer: Self-pay

## 2014-11-13 LAB — TSH: TSH: 3.82 u[IU]/mL (ref 0.450–4.500)

## 2014-11-13 NOTE — Telephone Encounter (Signed)
-----   Message from Alba CoryKrichna Sowles, MD sent at 11/13/2014  8:22 AM EDT ----- TSH is at goal Please notify patient, thank you

## 2014-11-13 NOTE — Telephone Encounter (Signed)
Left voicemail of normal TSH labs

## 2014-11-15 ENCOUNTER — Telehealth: Payer: Self-pay | Admitting: Family Medicine

## 2014-11-15 NOTE — Telephone Encounter (Signed)
Would like t

## 2014-11-28 ENCOUNTER — Encounter: Payer: Self-pay | Admitting: Family Medicine

## 2015-01-15 ENCOUNTER — Other Ambulatory Visit: Payer: Self-pay | Admitting: Family Medicine

## 2015-01-15 NOTE — Telephone Encounter (Signed)
Patient requesting refill. 

## 2015-01-17 ENCOUNTER — Encounter: Payer: Self-pay | Admitting: *Deleted

## 2015-01-20 ENCOUNTER — Ambulatory Visit: Payer: Medicare Other | Admitting: Anesthesiology

## 2015-01-20 ENCOUNTER — Ambulatory Visit
Admission: RE | Admit: 2015-01-20 | Discharge: 2015-01-20 | Disposition: A | Discharge status: Home / Self Care | Payer: Medicare Other | Source: Ambulatory Visit | Attending: Unknown Physician Specialty | Admitting: Unknown Physician Specialty

## 2015-01-20 ENCOUNTER — Encounter
Admission: RE | Disposition: A | Discharge status: Home / Self Care | Payer: Self-pay | Source: Ambulatory Visit | Attending: Unknown Physician Specialty

## 2015-01-20 DIAGNOSIS — F329 Major depressive disorder, single episode, unspecified: Secondary | ICD-10-CM | POA: Insufficient documentation

## 2015-01-20 DIAGNOSIS — E039 Hypothyroidism, unspecified: Secondary | ICD-10-CM | POA: Insufficient documentation

## 2015-01-20 DIAGNOSIS — F419 Anxiety disorder, unspecified: Secondary | ICD-10-CM | POA: Insufficient documentation

## 2015-01-20 DIAGNOSIS — J449 Chronic obstructive pulmonary disease, unspecified: Secondary | ICD-10-CM | POA: Insufficient documentation

## 2015-01-20 DIAGNOSIS — Z1211 Encounter for screening for malignant neoplasm of colon: Secondary | ICD-10-CM | POA: Insufficient documentation

## 2015-01-20 DIAGNOSIS — K640 First degree hemorrhoids: Secondary | ICD-10-CM | POA: Insufficient documentation

## 2015-01-20 DIAGNOSIS — K648 Other hemorrhoids: Secondary | ICD-10-CM | POA: Diagnosis not present

## 2015-01-20 DIAGNOSIS — Z8371 Family history of colonic polyps: Secondary | ICD-10-CM | POA: Diagnosis not present

## 2015-01-20 DIAGNOSIS — I10 Essential (primary) hypertension: Secondary | ICD-10-CM | POA: Insufficient documentation

## 2015-01-20 DIAGNOSIS — E785 Hyperlipidemia, unspecified: Secondary | ICD-10-CM | POA: Diagnosis not present

## 2015-01-20 HISTORY — DX: Hypothyroidism, unspecified: E03.9

## 2015-01-20 HISTORY — PX: COLONOSCOPY WITH PROPOFOL: SHX5780

## 2015-01-20 LAB — GLUCOSE, CAPILLARY: Glucose-Capillary: 201 mg/dL — ABNORMAL HIGH (ref 65–99)

## 2015-01-20 SURGERY — COLONOSCOPY WITH PROPOFOL
Anesthesia: General

## 2015-01-20 MED ORDER — LIDOCAINE HCL (PF) 2 % IJ SOLN
INTRAMUSCULAR | Status: DC | PRN
  Administered 2015-01-20: 40 mg via INTRADERMAL

## 2015-01-20 MED ORDER — SODIUM CHLORIDE 0.9 % IV SOLN: INTRAVENOUS | Status: DC

## 2015-01-20 MED ORDER — SODIUM CHLORIDE 0.9 % IV SOLN
INTRAVENOUS | Status: DC
  Administered 2015-01-20: 1000 mL via INTRAVENOUS

## 2015-01-20 MED ORDER — PROPOFOL 10 MG/ML IV BOLUS
INTRAVENOUS | Status: DC | PRN
  Administered 2015-01-20: 50 mg via INTRAVENOUS

## 2015-01-20 MED ORDER — PROPOFOL INFUSION 10 MG/ML OPTIME
INTRAVENOUS | Status: DC | PRN
  Administered 2015-01-20: 100 ug/kg/min via INTRAVENOUS

## 2015-01-20 NOTE — Anesthesia Postprocedure Evaluation (Signed)
  Anesthesia Post-op Note  Patient: Megan Short  Procedure(s) Performed: Procedure(s): COLONOSCOPY WITH PROPOFOL (N/A)  Anesthesia type:General  Patient location: PACU  Post pain: Pain level controlled  Post assessment: Post-op Vital signs reviewed, Patient's Cardiovascular Status Stable, Respiratory Function Stable, Patent Airway and No signs of Nausea or vomiting  Post vital signs: Reviewed and stable  Last Vitals:  Filed Vitals:   01/20/15 0850  BP: 98/54  Pulse: 72  Temp:   Resp: 20    Level of consciousness: awake, alert  and patient cooperative  Complications: No apparent anesthesia complications

## 2015-01-20 NOTE — Transfer of Care (Signed)
Immediate Anesthesia Transfer of Care Note  Patient: Megan Short  Procedure(s) Performed: Procedure(s): COLONOSCOPY WITH PROPOFOL (N/A)  Patient Location: PACU  Anesthesia Type:General  Level of Consciousness: awake  Airway & Oxygen Therapy: Patient Spontanous Breathing  Post-op Assessment: Report given to RN, Post -op Vital signs reviewed and stable and Patient moving all extremities  Post vital signs: Reviewed and stable  Last Vitals:  Filed Vitals:   01/20/15 0839  BP: 99/58  Pulse: 78  Temp: 35.9 C  Resp: 20    Complications: No apparent anesthesia complications

## 2015-01-20 NOTE — Anesthesia Preprocedure Evaluation (Signed)
Anesthesia Evaluation  Patient identified by MRN, date of birth, ID band Patient awake    Reviewed: Allergy & Precautions, NPO status , Patient's Chart, lab work & pertinent test results  History of Anesthesia Complications Negative for: history of anesthetic complications  Airway Mallampati: III       Dental no notable dental hx.    Pulmonary COPD   Pulmonary exam normal       Cardiovascular hypertension, Pt. on medications Normal cardiovascular exam    Neuro/Psych    GI/Hepatic negative GI ROS,   Endo/Other  diabetes, Type 2, Oral Hypoglycemic AgentsHypothyroidism   Renal/GU   negative genitourinary   Musculoskeletal negative musculoskeletal ROS (+)   Abdominal (+) + obese,   Peds negative pediatric ROS (+)  Hematology negative hematology ROS (+)   Anesthesia Other Findings   Reproductive/Obstetrics negative OB ROS                             Anesthesia Physical Anesthesia Plan  ASA: III  Anesthesia Plan: General   Post-op Pain Management:    Induction: Intravenous  Airway Management Planned: Nasal Cannula  Additional Equipment:   Intra-op Plan:   Post-operative Plan:   Informed Consent: I have reviewed the patients History and Physical, chart, labs and discussed the procedure including the risks, benefits and alternatives for the proposed anesthesia with the patient or authorized representative who has indicated his/her understanding and acceptance.     Plan Discussed with: CRNA  Anesthesia Plan Comments:         Anesthesia Quick Evaluation

## 2015-01-20 NOTE — H&P (Signed)
Primary Care Physician:  Ruel Favors, MD Primary Gastroenterologist:  Dr. Mechele Collin  Pre-Procedure History & Physical: HPI:  Kanani Mowbray is a 69 y.o. female is here for an colonoscopy.   Past Medical History  Diagnosis Date  . Anxiety   . Hyperlipidemia   . Hypertension   . Depression   . COPD (chronic obstructive pulmonary disease)   . Hypothyroidism     Past Surgical History  Procedure Laterality Date  . Lithotripsy Left 12/2013  . Breast biopsy Right     negative times 2    Prior to Admission medications   Medication Sig Start Date End Date Taking? Authorizing Provider  amLODipine (NORVASC) 2.5 MG tablet Take 1 tablet (2.5 mg total) by mouth daily. 11/12/14  Yes Alba Cory, MD  atorvastatin (LIPITOR) 80 MG tablet Take by mouth. 08/08/14  Yes Historical Provider, MD  citalopram (CELEXA) 40 MG tablet Take by mouth. 03/25/14  Yes Historical Provider, MD  clonazePAM (KLONOPIN) 0.5 MG tablet Take 1 tablet (0.5 mg total) by mouth 2 (two) times daily as needed for anxiety. 11/12/14  Yes Alba Cory, MD  levothyroxine (SYNTHROID, LEVOTHROID) 75 MCG tablet Take 1 tablet (75 mcg total) by mouth daily before breakfast. 11/12/14  Yes Alba Cory, MD  losartan (COZAAR) 100 MG tablet Take 1 tablet (100 mg total) by mouth daily. 11/12/14  Yes Alba Cory, MD  aspirin 81 MG tablet Take by mouth.    Historical Provider, MD  glipiZIDE (GLUCOTROL XL) 5 MG 24 hr tablet Take by mouth. 08/08/14   Historical Provider, MD  Glucose Blood DISK BAYER BREEZE 2 TEST (In Vitro Disk)  1 Disk check fsbs daily for 90 days  Quantity: 10;  Refills: 1   Ordered :07-Apr-2012  Alba Cory MD;  Mora Appl 07-Apr-2012 Active 04/07/12   Historical Provider, MD  metFORMIN (GLUCOPHAGE) 500 MG tablet Take by mouth. 08/08/14   Historical Provider, MD  Vitamin D, Ergocalciferol, (DRISDOL) 50000 UNITS CAPS capsule TAKE ONE CAPSULE BY MOUTH WEEKLY 01/15/15   Alba Cory, MD    Allergies as of 11/26/2014 - Review  Complete 11/12/2014  Allergen Reaction Noted  . Sulfa antibiotics  09/07/2014    Family History  Problem Relation Age of Onset  . Osteoarthritis Mother   . Osteoarthritis Sister   . Alzheimer's disease Maternal Aunt     Social History   Social History  . Marital Status: Married    Spouse Name: N/A  . Number of Children: N/A  . Years of Education: N/A   Occupational History  . Not on file.   Social History Main Topics  . Smoking status: Never Smoker   . Smokeless tobacco: Not on file  . Alcohol Use: No  . Drug Use: No  . Sexual Activity: No   Other Topics Concern  . Not on file   Social History Narrative    Review of Systems: See HPI, otherwise negative ROS  Physical Exam: BP 120/61 mmHg  Pulse 70  Temp(Src) 97.8 F (36.6 C) (Tympanic)  Resp 16  Ht 5' (1.524 m)  Wt 69.4 kg (153 lb)  BMI 29.88 kg/m2  SpO2 100% General:   Alert,  pleasant and cooperative in NAD Head:  Normocephalic and atraumatic. Neck:  Supple; no masses or thyromegaly. Lungs:  Clear throughout to auscultation.    Heart:  Regular rate and rhythm. Abdomen:  Soft, nontender and nondistended. Normal bowel sounds, without guarding, and without rebound.   Neurologic:  Alert and  oriented x4;  grossly normal neurologically.  Impression/Plan: Breklyn Fabrizio is here for an colonoscopy to be performed for screening with family hx colon polyps  Risks, benefits, limitations, and alternatives regarding  colonoscopy have been reviewed with the patient.  Questions have been answered.  All parties agreeable.   Lynnae Prude, MD  01/20/2015, 8:09 AM

## 2015-01-20 NOTE — Op Note (Signed)
[facility] Gastroenterology Patient Name: Megan Short Procedure Date: 01/20/2015 7:48 AM MRN: [id number] Account #: [id number] Date of Birth: 05/08/1946 Admit Type: Outpatient Age: 69 Room: [facility] ENDO ROOM 1 Gender: Female Note Status: Finalized Procedure:         Colonoscopy Indications:       Colon cancer screening in patient at increased risk:                     Family history of colon polyps Providers:         Scot Jun, MD Referring MD:      Charisse March (Referring MD) Medicines:         Propofol per Anesthesia Complications:     No immediate complications. Procedure:         Pre-Anesthesia Assessment:                    - After reviewing the risks and benefits, the patient was                     deemed in satisfactory condition to undergo the procedure.                    After obtaining informed consent, the colonoscope was                     passed under direct vision. Throughout the procedure, the                     patient's blood pressure, pulse, and oxygen saturations                     were monitored continuously. The Colonoscope was                     introduced through the anus and advanced to the the cecum,                     identified by appendiceal orifice and ileocecal valve. The                     colonoscopy was performed without difficulty. The patient                     tolerated the procedure well. The quality of the bowel                     preparation was excellent. Findings:      Internal hemorrhoids were found during endoscopy. The hemorrhoids were       small and Grade I (internal hemorrhoids that do not prolapse).      The exam was otherwise without abnormality. Impression:        - Internal hemorrhoids.                    - The examination was otherwise normal.                    - No specimens collected. Recommendation:    - Repeat colonoscopy in 5 years for screening purposes. Scot Jun,  MD 01/20/2015 8:34:27 AM This report has been signed electronically. Number of Addenda: 0 Note Initiated On: 01/20/2015 7:48 AM Scope Withdrawal Time: 0 hours 7 minutes 14 seconds  Total Procedure Duration: 0 hours 15 minutes  0 seconds       [facility]

## 2015-01-21 ENCOUNTER — Encounter: Payer: Self-pay | Admitting: Unknown Physician Specialty

## 2015-02-12 ENCOUNTER — Ambulatory Visit (INDEPENDENT_AMBULATORY_CARE_PROVIDER_SITE_OTHER): Payer: Medicare Other | Admitting: Family Medicine

## 2015-02-12 ENCOUNTER — Encounter: Payer: Self-pay | Admitting: Family Medicine

## 2015-02-12 VITALS — BP 108/62 | HR 93 | Temp 98.1°F | Resp 16 | Ht 60.0 in | Wt 157.0 lb

## 2015-02-12 DIAGNOSIS — N183 Chronic kidney disease, stage 3 unspecified: Secondary | ICD-10-CM

## 2015-02-12 DIAGNOSIS — Z23 Encounter for immunization: Secondary | ICD-10-CM | POA: Diagnosis not present

## 2015-02-12 DIAGNOSIS — M25561 Pain in right knee: Secondary | ICD-10-CM

## 2015-02-12 DIAGNOSIS — F419 Anxiety disorder, unspecified: Secondary | ICD-10-CM

## 2015-02-12 DIAGNOSIS — E038 Other specified hypothyroidism: Secondary | ICD-10-CM | POA: Diagnosis not present

## 2015-02-12 DIAGNOSIS — W19XXXS Unspecified fall, sequela: Secondary | ICD-10-CM

## 2015-02-12 DIAGNOSIS — I10 Essential (primary) hypertension: Secondary | ICD-10-CM

## 2015-02-12 DIAGNOSIS — F329 Major depressive disorder, single episode, unspecified: Secondary | ICD-10-CM

## 2015-02-12 DIAGNOSIS — F418 Other specified anxiety disorders: Secondary | ICD-10-CM

## 2015-02-12 DIAGNOSIS — L989 Disorder of the skin and subcutaneous tissue, unspecified: Secondary | ICD-10-CM | POA: Diagnosis not present

## 2015-02-12 DIAGNOSIS — E1129 Type 2 diabetes mellitus with other diabetic kidney complication: Secondary | ICD-10-CM | POA: Diagnosis not present

## 2015-02-12 DIAGNOSIS — E1121 Type 2 diabetes mellitus with diabetic nephropathy: Secondary | ICD-10-CM

## 2015-02-12 LAB — POCT GLYCOSYLATED HEMOGLOBIN (HGB A1C): Hemoglobin A1C: 7

## 2015-02-12 MED ORDER — ATORVASTATIN CALCIUM 80 MG PO TABS: 80.0000 mg | ORAL_TABLET | Freq: Every evening | ORAL | Status: DC

## 2015-02-12 MED ORDER — CITALOPRAM HYDROBROMIDE 40 MG PO TABS: 40.0000 mg | ORAL_TABLET | Freq: Every day | ORAL | Status: DC

## 2015-02-12 MED ORDER — GLIPIZIDE ER 5 MG PO TB24: 5.0000 mg | ORAL_TABLET | Freq: Every day | ORAL | Status: DC

## 2015-02-12 MED ORDER — DICLOFENAC SODIUM 1 % TD GEL: 4.0000 g | Freq: Four times a day (QID) | TRANSDERMAL | Status: DC

## 2015-02-12 MED ORDER — METFORMIN HCL 500 MG PO TABS: 500.0000 mg | ORAL_TABLET | Freq: Three times a day (TID) | ORAL | Status: DC

## 2015-02-12 NOTE — Progress Notes (Signed)
Name: Megan Short   MRN: [id number]    DOB: 30-Mar-1946   Date:02/12/2015       Progress Note  Subjective  Chief Complaint  Chief Complaint  Patient presents with  . Medication Refill    3 month F/U  . Hypertension  . Hypothyroidism  . Diabetes    checks BG 1x day low-108, avg-120, high-180  . Fall    1 week ago  . Knee Pain    right fell on knee    HPI  Fall : she was carrying a water melon to her field, the ground was wet from a recent rain, she slipped and fell on right knee about 10 days ago. Knee was flexed when she fell. She  has a daily pain 4/10. Using topical medication without improvement. No swelling  HTN: taking medication and denies side effects. No chest pain, no palpitation  Hypothyroidism: no dry skin or constipation,  DMII: fsbs in am's between 120-150's, in the pm's is usually better, compliant with medication, no side effects, no polyphagia, polyuria or polydipsia.   CKI: labs done three months ago. Sees Nephrologist    Patient Active Problem List   Diagnosis Date Noted  . Obesity, Class I, BMI 30-34.9 11/12/2014  . Chronic kidney disease (CKD), stage III (moderate) 09/07/2014  . Diabetes mellitus with renal manifestations, controlled 09/07/2014  . Adult hypothyroidism 09/07/2014  . Vitamin D deficiency 09/07/2014  . Benign essential HTN 09/07/2014  . Microalbuminuria 09/07/2014  . Dyslipidemia 09/07/2014  . Anxiety and depression 09/07/2014  . 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/07/2014  . Calculus of kidney 09/07/2014  . Memory loss or impairment 09/07/2014    Past Surgical History  Procedure Laterality Date  . Lithotripsy Left 12/2013  . Breast biopsy Right     negative times 2  . Colonoscopy with propofol N/A 01/20/2015    Procedure: COLONOSCOPY WITH PROPOFOL;  Surgeon: Scot Jun, MD;  Location: [facility] ENDOSCOPY;  Service: Endoscopy;  Laterality: N/A;    Family History  Problem Relation Age of Onset  . Osteoarthritis  Mother   . Osteoarthritis Sister   . Alzheimer's disease Maternal Aunt     Social History   Social History  . Marital Status: Married    Spouse Name: N/A  . Number of Children: N/A  . Years of Education: N/A   Occupational History  . Not on file.   Social History Main Topics  . Smoking status: Never Smoker   . Smokeless tobacco: Never Used  . Alcohol Use: No  . Drug Use: No  . Sexual Activity: No   Other Topics Concern  . Not on file   Social History Narrative     Current outpatient prescriptions:  .  amLODipine (NORVASC) 2.5 MG tablet, Take 1 tablet (2.5 mg total) by mouth daily., Disp: 90 tablet, Rfl: 1 .  aspirin 81 MG tablet, Take by mouth., Disp: , Rfl:  .  atorvastatin (LIPITOR) 80 MG tablet, Take 1 tablet (80 mg total) by mouth every evening., Disp: 90 tablet, Rfl: 1 .  citalopram (CELEXA) 40 MG tablet, Take 1 tablet (40 mg total) by mouth daily., Disp: 90 tablet, Rfl: 1 .  clonazePAM (KLONOPIN) 0.5 MG tablet, Take 1 tablet (0.5 mg total) by mouth 2 (two) times daily as needed for anxiety., Disp: 30 tablet, Rfl: 0 .  glipiZIDE (GLUCOTROL XL) 5 MG 24 hr tablet, Take 1 tablet (5 mg total) by mouth daily with breakfast., Disp: 90 tablet, Rfl:  1 .  Glucose Blood DISK, BAYER BREEZE 2 TEST (In Vitro Disk)  1 Disk check fsbs daily for 90 days  Quantity: 10;  Refills: 1   Ordered :07-Apr-2012  Alba Cory MD;  Started 07-Apr-2012 Active, Disp: , Rfl:  .  levothyroxine (SYNTHROID, LEVOTHROID) 75 MCG tablet, Take 1 tablet (75 mcg total) by mouth daily before breakfast., Disp: 90 tablet, Rfl: 1 .  losartan (COZAAR) 100 MG tablet, Take 1 tablet (100 mg total) by mouth daily., Disp: 90 tablet, Rfl: 1 .  metFORMIN (GLUCOPHAGE) 500 MG tablet, Take 1 tablet (500 mg total) by mouth 3 (three) times daily., Disp: 270 tablet, Rfl: 1 .  Vitamin D, Ergocalciferol, (DRISDOL) 50000 UNITS CAPS capsule, TAKE ONE CAPSULE BY MOUTH WEEKLY, Disp: 12 capsule, Rfl: 0  Allergies  Allergen  Reactions  . Sulfa Antibiotics      ROS  Constitutional: Negative for fever or weight change.  Respiratory: Negative for cough and shortness of breath.   Cardiovascular: Negative for chest pain or palpitations.  Gastrointestinal: Negative for abdominal pain, no bowel changes.  Musculoskeletal: Negative for gait problem or joint swelling.  Skin: Negative for rash.  Neurological: Negative for dizziness or headache.  No other specific complaints in a complete review of systems (except as listed in HPI above).  Objective  Filed Vitals:   02/12/15 0951  BP: 108/62  Pulse: 93  Temp: 98.1 F (36.7 C)  TempSrc: Oral  Resp: 16  Height: 5' (1.524 m)  Weight: 157 lb (71.215 kg)  SpO2: 98%    Body mass index is 30.66 kg/(m^2).  Physical Exam  Constitutional: Patient appears well-developed and well-nourished. Obese  No distress.  HEENT: head atraumatic, normocephalic, pupils equal and reactive to light, neck supple, throat within normal limits Cardiovascular: Normal rate, regular rhythm and normal heart sounds.  No murmur heard. No BLE edema. Pulmonary/Chest: Effort normal and breath sounds normal. No respiratory distress. Abdominal: Soft.  There is no tenderness. Psychiatric: Patient has a normal mood and affect. behavior is normal. Judgment and thought content normal. Muscular Skeletal: no knee effusion, no redness, normal rom, pain during palpation of right anterior knee  Recent Results (from the past 2160 hour(s))  Glucose, capillary     Status: Abnormal   Collection Time: 01/20/15  7:38 AM  Result Value Ref Range   Glucose-Capillary 201 (H) 65 - 99 mg/dL   Comment 1 EPIC   POCT HgB A1C     Status: Abnormal   Collection Time: 02/12/15 10:06 AM  Result Value Ref Range   Hemoglobin A1C 7.0     Diabetic Foot Exam: Diabetic Foot Exam - Simple   Simple Foot Form  Visual Inspection  No deformities, no ulcerations, no other skin breakdown bilaterally:  Yes  Sensation  Testing  Intact to touch and monofilament testing bilaterally:  Yes  Pulse Check  Posterior Tibialis and Dorsalis pulse intact bilaterally:  Yes  Comments       PHQ2/9: Depression screen [facility] 2/9 02/12/2015 11/12/2014  Decreased Interest 0 0  Down, Depressed, Hopeless 0 0  PHQ - 2 Score 0 0     Fall Risk: Fall Risk  02/12/2015 11/12/2014  Falls in the past year? Yes No  Number falls in past yr: 1 -  Injury with Fall? Yes -      Assessment & Plan  1. Type 2 diabetes mellitus with other diabetic kidney complication Resume diet, we will increase metformin to one in am and 2 in pm, hgbA1C  is up, but still at goal - POCT HgB A1C - metFORMIN (GLUCOPHAGE) 500 MG tablet; Take 1 tablet (500 mg total) by mouth 3 (three) times daily.  Dispense: 270 tablet; Refill: 1 - glipiZIDE (GLUCOTROL XL) 5 MG 24 hr tablet; Take 1 tablet (5 mg total) by mouth daily with breakfast.  Dispense: 90 tablet; Refill: 1  2. Needs flu shot  - Flu vaccine HIGH DOSE PF (Fluzone High dose)  3. Benign essential HTN  At goal, continue medication   4. Anxiety and depression stable - citalopram (CELEXA) 40 MG tablet; Take 1 tablet (40 mg total) by mouth daily.  Dispense: 90 tablet; Refill: 1  5. Chronic kidney disease (CKD), stage III (moderate) Continue follow up with Nephrologist  6. Other specified hypothyroidism Recheck labs next visit  7. Skin lesion Hypopigmented round lesion on left shoulder that is growing in size, and hyperpigmentation on lower anterior legs - Ambulatory referral to Dermatology

## 2015-02-18 ENCOUNTER — Telehealth: Payer: Self-pay

## 2015-02-18 DIAGNOSIS — M25561 Pain in right knee: Secondary | ICD-10-CM

## 2015-02-18 NOTE — Telephone Encounter (Signed)
She wanted to let Dr. Carlynn Purl know she picked up the gel on Sunday as instructed but her knee still isn't feeling any better, please advise.

## 2015-02-18 NOTE — Telephone Encounter (Signed)
I will refer her to Ortho

## 2015-02-20 DIAGNOSIS — L821 Other seborrheic keratosis: Secondary | ICD-10-CM | POA: Diagnosis not present

## 2015-02-20 DIAGNOSIS — L810 Postinflammatory hyperpigmentation: Secondary | ICD-10-CM | POA: Diagnosis not present

## 2015-02-20 DIAGNOSIS — L815 Leukoderma, not elsewhere classified: Secondary | ICD-10-CM | POA: Diagnosis not present

## 2015-03-10 DIAGNOSIS — M1711 Unilateral primary osteoarthritis, right knee: Secondary | ICD-10-CM | POA: Diagnosis not present

## 2015-03-10 DIAGNOSIS — M25561 Pain in right knee: Secondary | ICD-10-CM | POA: Diagnosis not present

## 2015-03-25 DIAGNOSIS — E119 Type 2 diabetes mellitus without complications: Secondary | ICD-10-CM | POA: Diagnosis not present

## 2015-05-21 ENCOUNTER — Ambulatory Visit: Payer: Medicare Other | Admitting: Family Medicine

## 2015-06-12 ENCOUNTER — Ambulatory Visit (INDEPENDENT_AMBULATORY_CARE_PROVIDER_SITE_OTHER): Payer: Medicare Other | Admitting: Family Medicine

## 2015-06-12 ENCOUNTER — Encounter: Payer: Self-pay | Admitting: Family Medicine

## 2015-06-12 VITALS — BP 112/68 | HR 90 | Temp 97.8°F | Resp 18 | Ht 60.0 in | Wt 153.5 lb

## 2015-06-12 DIAGNOSIS — I10 Essential (primary) hypertension: Secondary | ICD-10-CM | POA: Diagnosis not present

## 2015-06-12 DIAGNOSIS — E039 Hypothyroidism, unspecified: Secondary | ICD-10-CM

## 2015-06-12 DIAGNOSIS — F418 Other specified anxiety disorders: Secondary | ICD-10-CM | POA: Diagnosis not present

## 2015-06-12 DIAGNOSIS — E1129 Type 2 diabetes mellitus with other diabetic kidney complication: Secondary | ICD-10-CM | POA: Diagnosis not present

## 2015-06-12 DIAGNOSIS — N183 Chronic kidney disease, stage 3 unspecified: Secondary | ICD-10-CM

## 2015-06-12 DIAGNOSIS — F419 Anxiety disorder, unspecified: Secondary | ICD-10-CM

## 2015-06-12 DIAGNOSIS — F329 Major depressive disorder, single episode, unspecified: Secondary | ICD-10-CM

## 2015-06-12 DIAGNOSIS — E1121 Type 2 diabetes mellitus with diabetic nephropathy: Secondary | ICD-10-CM

## 2015-06-12 LAB — POCT GLYCOSYLATED HEMOGLOBIN (HGB A1C): HEMOGLOBIN A1C: 6.5

## 2015-06-12 LAB — POCT UA - MICROALBUMIN: MICROALBUMIN (UR) POC: 50 mg/L

## 2015-06-12 MED ORDER — AMLODIPINE BESYLATE 2.5 MG PO TABS: 2.5000 mg | ORAL_TABLET | Freq: Every day | ORAL | Status: DC

## 2015-06-12 MED ORDER — LOSARTAN POTASSIUM 100 MG PO TABS: 100.0000 mg | ORAL_TABLET | Freq: Every day | ORAL | Status: DC

## 2015-06-12 MED ORDER — LEVOTHYROXINE SODIUM 75 MCG PO TABS: 75.0000 ug | ORAL_TABLET | Freq: Every day | ORAL | Status: DC

## 2015-06-12 MED ORDER — CITALOPRAM HYDROBROMIDE 40 MG PO TABS: 40.0000 mg | ORAL_TABLET | Freq: Every day | ORAL | Status: DC

## 2015-06-12 MED ORDER — GLIPIZIDE ER 5 MG PO TB24: 5.0000 mg | ORAL_TABLET | Freq: Every day | ORAL | Status: DC

## 2015-06-12 MED ORDER — CLONAZEPAM 0.5 MG PO TABS: 0.5000 mg | ORAL_TABLET | Freq: Two times a day (BID) | ORAL | Status: DC | PRN

## 2015-06-12 NOTE — Progress Notes (Signed)
Name: Megan Short   MRN: [id number]    DOB: [birth year]   Date:06/12/2015       Progress Note  Subjective  Chief Complaint  Chief Complaint  Patient presents with  . Medication Refill    3 month F/U  . Diabetes    Patient checks BS once daily in the am's Low-89 Average-100's High-187, and has been working out daily doing an elliptical bike. Patient states she is unable to tolerate the Metformin 3x daily due to it making her stomach queasy.  Marland Kitchen Hypertension  . Hypothyroidism  . Hyperlipidemia    HPI  HTN: taking medication and denies side effects. No chest pain, no palpitation  Hypothyroidism: no dry skin or constipation, no weight gain. She is taking Levothyroxine as prescribed  DMII: fsbs is at goal, around 100's, high of 187. She is taking Metformin twice daily because three times daily causes nausea. She is exercise 5 days a week, and hgbA1C is at goal - 6.5%. Lost almost 4 lbs since last visit. She denies polyphagia, polydipsia or polyuria. She is taking ARB and urine micro has improved down from 100 to 50 . She states that paresthesia has resolved.   CKI: labs done three months ago. Sees Nephrologist yearly - Dr. Thedore Mins  Anxiety/Depression: doing well , she is doing well on Celexa and BZD's. She denies anhedonia, or crying spells, still has difficulty making decisions - mostly shopping decisions.    Patient Active Problem List   Diagnosis Date Noted  . Obesity, Class I, BMI 30-34.9 11/12/2014  . Chronic kidney disease (CKD), stage III (moderate) 09/07/2014  . Diabetes mellitus with renal manifestations, controlled (HCC) 09/07/2014  . Adult hypothyroidism 09/07/2014  . Vitamin D deficiency 09/07/2014  . Benign essential HTN 09/07/2014  . Microalbuminuria 09/07/2014  . Dyslipidemia 09/07/2014  . Anxiety and depression 09/07/2014  . 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/07/2014  . Calculus of kidney 09/07/2014  . Memory loss or impairment 09/07/2014    Past  Surgical History  Procedure Laterality Date  . Lithotripsy Left 12/2013  . Breast biopsy Right     negative times 2  . Colonoscopy with propofol N/A 01/20/2015    Procedure: COLONOSCOPY WITH PROPOFOL;  Surgeon: Scot Jun, MD;  Location: [facility] ENDOSCOPY;  Service: Endoscopy;  Laterality: N/A;    Family History  Problem Relation Age of Onset  . Osteoarthritis Mother   . Osteoarthritis Sister   . Alzheimer's disease Maternal Aunt     Social History   Social History  . Marital Status: Married    Spouse Name: N/A  . Number of Children: N/A  . Years of Education: N/A   Occupational History  . Not on file.   Social History Main Topics  . Smoking status: Never Smoker   . Smokeless tobacco: Never Used  . Alcohol Use: No  . Drug Use: No  . Sexual Activity: No   Other Topics Concern  . Not on file   Social History Narrative     Current outpatient prescriptions:  .  amLODipine (NORVASC) 2.5 MG tablet, Take 1 tablet (2.5 mg total) by mouth daily., Disp: 90 tablet, Rfl: 1 .  aspirin 81 MG tablet, Take by mouth., Disp: , Rfl:  .  atorvastatin (LIPITOR) 80 MG tablet, Take 1 tablet (80 mg total) by mouth every evening., Disp: 90 tablet, Rfl: 1 .  citalopram (CELEXA) 40 MG tablet, Take 1 tablet (40 mg total) by mouth daily., Disp: 90 tablet, Rfl: 1 .  clonazePAM (KLONOPIN) 0.5 MG tablet, Take 1 tablet (0.5 mg total) by mouth 2 (two) times daily as needed for anxiety., Disp: 30 tablet, Rfl: 0 .  diclofenac sodium (VOLTAREN) 1 % GEL, Apply 4 g topically 4 (four) times daily., Disp: 100 g, Rfl: 1 .  glipiZIDE (GLUCOTROL XL) 5 MG 24 hr tablet, Take 1 tablet (5 mg total) by mouth daily with breakfast., Disp: 90 tablet, Rfl: 1 .  Glucose Blood DISK, BAYER BREEZE 2 TEST (In Vitro Disk)  1 Disk check fsbs daily for 90 days  Quantity: 10;  Refills: 1   Ordered :07-Apr-2012  [name], Haizlee Henton MD;  Started 07-Apr-2012 Active, Disp: , Rfl:  .  levothyroxine (SYNTHROID, LEVOTHROID) 75 MCG tablet,  Take 1 tablet (75 mcg total) by mouth daily before breakfast., Disp: 90 tablet, Rfl: 1 .  losartan (COZAAR) 100 MG tablet, Take 1 tablet (100 mg total) by mouth daily., Disp: 90 tablet, Rfl: 1 .  metFORMIN (GLUCOPHAGE) 500 MG tablet, Take 1 tablet (500 mg total) by mouth 3 (three) times daily. (Patient taking differently: Take 500 mg by mouth 2 (two) times daily with a meal. ), Disp: 270 tablet, Rfl: 1 .  Vitamin D, Ergocalciferol, (DRISDOL) 50000 UNITS CAPS capsule, TAKE ONE CAPSULE BY MOUTH WEEKLY, Disp: 12 capsule, Rfl: 0  Allergies  Allergen Reactions  . Sulfa Antibiotics      ROS  Constitutional: Negative for fever , positive for mild  weight change.  Respiratory: Negative for cough and shortness of breath.   Cardiovascular: Negative for chest pain or palpitations.  Gastrointestinal: Negative for abdominal pain, positive for  bowel changes - since she started going to the gym, no longer constipated.  Musculoskeletal: Negative for gait problem or joint swelling.  Skin: Negative for rash.  Neurological: Negative for dizziness or headache.  No other specific complaints in a complete review of systems (except as listed in HPI above). Objective  Filed Vitals:   06/12/15 1019  BP: 112/68  Pulse: 90  Temp: 97.8 F (36.6 C)  TempSrc: Oral  Resp: 18  Height: 5' (1.524 m)  Weight: 153 lb 8 oz (69.627 kg)  SpO2: 99%    Body mass index is 29.98 kg/(m^2).  Physical Exam  Constitutional: Patient appears well-developed and well-nourished. Obese  No distress.  HEENT: head atraumatic, normocephalic, pupils equal and reactive to light, neck supple, throat within normal limits Cardiovascular: Normal rate, regular rhythm and normal heart sounds.  No murmur heard. No BLE edema. Pulmonary/Chest: Effort normal and breath sounds normal. No respiratory distress. Abdominal: Soft.  There is no tenderness. Psychiatric: Patient has a normal mood and affect. behavior is normal. Judgment and  thought content normal.  Recent Results (from the past 2160 hour(s))  POCT HgB A1C     Status: None   Collection Time: 06/12/15 10:18 AM  Result Value Ref Range   Hemoglobin A1C 6.5   POCT UA - Microalbumin     Status: Abnormal   Collection Time: 06/12/15 10:18 AM  Result Value Ref Range   Microalbumin Ur, POC 50 mg/L   Creatinine, POC  mg/dL   Albumin/Creatinine Ratio, Urine, POC      PH[facility]9: [facility] 06/12/2015 02/12/2015 11/12/2014  Decreased Interest 0 0 0  Down, Depressed, Hopeless 0 0 0  PHQ - 2 Score 0 0 0     Fall Risk: Fall Risk  06/12/2015 02/12/2015 11/12/2014  Falls in the past year? Yes Yes No  Number falls in past yr: 1  1 -  Injury with Fall? Yes Yes -     Functional Status Survey: Is the patient deaf or have difficulty hearing?: No Does the patient have difficulty seeing, even when wearing glasses/contacts?: Yes (glasses) Does the patient have difficulty concentrating, remembering, or making decisions?: No Does the patient have difficulty walking or climbing stairs?: No Does the patient have difficulty dressing or bathing?: No Does the patient have difficulty doing errands alone such as visiting a doctor's office or shopping?: No    Assessment & Plan  1. Type II diabetes mellitus with nephropathy (HCC)  - POCT HgB A1C - POCT UA - Microalbumin  2. Anxiety and depression  - clonazePAM (KLONOPIN) 0.5 MG tablet; Take 1 tablet (0.5 mg total) by mouth 2 (two) times daily as needed for anxiety.  Dispense: 30 tablet; Refill: 0 - citalopram (CELEXA) 40 MG tablet; Take 1 tablet (40 mg total) by mouth daily.  Dispense: 90 tablet; Refill: 1  3. Chronic kidney disease (CKD), stage III (moderate)  - losartan (COZAAR) 100 MG tablet; Take 1 tablet (100 mg total) by mouth daily.  Dispense: 90 tablet; Refill: 1  4. Type 2 diabetes mellitus with other diabetic kidney complication (HCC)  - glipiZIDE (GLUCOTROL XL) 5 MG 24 hr tablet; Take 1 tablet (5 mg  total) by mouth daily with breakfast.  Dispense: 90 tablet; Refill: 1  5. Essential hypertension  - losartan (COZAAR) 100 MG tablet; Take 1 tablet (100 mg total) by mouth daily.  Dispense: 90 tablet; Refill: 1 - amLODipine (NORVASC) 2.5 MG tablet; Take 1 tablet (2.5 mg total) by mouth daily.  Dispense: 90 tablet; Refill: 1  6. Hypothyroidism, unspecified hypothyroidism type  - levothyroxine (SYNTHROID, LEVOTHROID) 75 MCG tablet; Take 1 tablet (75 mcg total) by mouth daily before breakfast.  Dispense: 90 tablet; Refill: 1

## 2015-07-15 ENCOUNTER — Encounter: Payer: Self-pay | Admitting: Family Medicine

## 2015-09-04 DIAGNOSIS — E1122 Type 2 diabetes mellitus with diabetic chronic kidney disease: Secondary | ICD-10-CM | POA: Diagnosis not present

## 2015-09-04 DIAGNOSIS — E119 Type 2 diabetes mellitus without complications: Secondary | ICD-10-CM | POA: Diagnosis not present

## 2015-09-04 DIAGNOSIS — Z87442 Personal history of urinary calculi: Secondary | ICD-10-CM | POA: Diagnosis not present

## 2015-09-04 DIAGNOSIS — N182 Chronic kidney disease, stage 2 (mild): Secondary | ICD-10-CM | POA: Diagnosis not present

## 2015-09-04 DIAGNOSIS — I10 Essential (primary) hypertension: Secondary | ICD-10-CM | POA: Diagnosis not present

## 2015-09-04 LAB — CBC AND DIFFERENTIAL
HEMATOCRIT: 38 % (ref 36–46)
HEMOGLOBIN: 12.6 g/dL (ref 12.0–16.0)
NEUTROS ABS: 2 /uL
Platelets: 303 10*3/uL (ref 150–399)
WBC: 5.7 10^3/mL

## 2015-09-04 LAB — BASIC METABOLIC PANEL
BUN: 21 mg/dL (ref 4–21)
Creatinine: 1 mg/dL (ref 0.5–1.1)
GLUCOSE: 153 mg/dL
Potassium: 4.7 mmol/L (ref 3.4–5.3)
SODIUM: 140 mmol/L (ref 137–147)

## 2015-09-04 LAB — LIPID PANEL
CHOLESTEROL: 97 mg/dL (ref 0–200)
HDL: 27 mg/dL — AB (ref 35–70)
LDL Cholesterol: 56 mg/dL
LDL/HDL RATIO: 2.1
TRIGLYCERIDES: 68 mg/dL (ref 40–160)

## 2015-09-04 LAB — HEMOGLOBIN A1C: Hemoglobin A1C: 6.8

## 2015-09-04 LAB — TSH: TSH: 0.89 u[IU]/mL (ref 0.41–5.90)

## 2015-09-05 ENCOUNTER — Encounter: Payer: Self-pay | Admitting: Family Medicine

## 2015-09-05 ENCOUNTER — Other Ambulatory Visit: Payer: Self-pay | Admitting: Family Medicine

## 2015-09-05 DIAGNOSIS — Z1231 Encounter for screening mammogram for malignant neoplasm of breast: Secondary | ICD-10-CM

## 2015-09-10 ENCOUNTER — Encounter: Payer: Self-pay | Admitting: Family Medicine

## 2015-09-10 ENCOUNTER — Ambulatory Visit (INDEPENDENT_AMBULATORY_CARE_PROVIDER_SITE_OTHER): Payer: Medicare Other | Admitting: Family Medicine

## 2015-09-10 VITALS — BP 118/78 | HR 89 | Temp 97.6°F | Resp 16 | Wt 150.5 lb

## 2015-09-10 DIAGNOSIS — Z1231 Encounter for screening mammogram for malignant neoplasm of breast: Secondary | ICD-10-CM

## 2015-09-10 DIAGNOSIS — R809 Proteinuria, unspecified: Secondary | ICD-10-CM | POA: Diagnosis not present

## 2015-09-10 DIAGNOSIS — N183 Chronic kidney disease, stage 3 unspecified: Secondary | ICD-10-CM

## 2015-09-10 DIAGNOSIS — Z23 Encounter for immunization: Secondary | ICD-10-CM | POA: Diagnosis not present

## 2015-09-10 DIAGNOSIS — E66811 Obesity, class 1: Secondary | ICD-10-CM

## 2015-09-10 DIAGNOSIS — F325 Major depressive disorder, single episode, in full remission: Secondary | ICD-10-CM

## 2015-09-10 DIAGNOSIS — E669 Obesity, unspecified: Secondary | ICD-10-CM | POA: Diagnosis not present

## 2015-09-10 DIAGNOSIS — E785 Hyperlipidemia, unspecified: Secondary | ICD-10-CM

## 2015-09-10 DIAGNOSIS — I10 Essential (primary) hypertension: Secondary | ICD-10-CM | POA: Diagnosis not present

## 2015-09-10 DIAGNOSIS — E1121 Type 2 diabetes mellitus with diabetic nephropathy: Secondary | ICD-10-CM

## 2015-09-10 DIAGNOSIS — E039 Hypothyroidism, unspecified: Secondary | ICD-10-CM

## 2015-09-10 MED ORDER — ATORVASTATIN CALCIUM 80 MG PO TABS: 80.0000 mg | ORAL_TABLET | Freq: Every evening | ORAL | Status: DC

## 2015-09-10 MED ORDER — METFORMIN HCL 500 MG PO TABS: 500.0000 mg | ORAL_TABLET | Freq: Two times a day (BID) | ORAL | Status: DC

## 2015-09-10 NOTE — Progress Notes (Signed)
Name: Megan JesterBrenda Andes   MRN: [id number]    DOB: September 20, 1945   Date:09/10/2015       Progress Note  Subjective  Chief Complaint  Chief Complaint  Patient presents with  . Diabetes    patient is here for her 2355-month f/u. patient recently had labs drawn on 09/04/15. patient checks her BS everyday in the morning. She stated that it ranges around the mid to lower 100's.  . Anxiety  . Depression  . Hypertension  . Hypothyroidism  . Neck Pain    patient stated that she had called about having some neck and shoulder pain but it has calmed down some. she was not sure it it was due to the way she was laying or not.  . Orders    patient has a mammogram scheduled for 10/16/15, needs an order.    HPI  HTN: taking medication and denies side effects. No chest pain, no palpitation, no orthostatic changes  Hypothyroidism: no dry skin or constipation, she has lost some weight, but she states she has been trying to lose weight. She is taking Levothyroxine as prescribed  DMII: fsbs is at goal, around 100's, high of 187. She is taking Metformin twice daily because three times daily causes nausea. She is exercise 5 days a week, and hgbA1C is at goal - 6.5%. Lost almost 4 lbs since last visit. She denies polyphagia, polydipsia or polyuria. She is taking ARB and urine micro has improved down from 100 to 50 . She states that paresthesia has resolved.   CKI:  Sees Nephrologist yearly - Dr. Thedore MinsSingh, denies decrease in urinary volume  Major Depression: it was caused by death of her son 9 years ago, she is doing well on Celexa and BZD's very seldom.  She denies anhedonia, or crying spells, still has difficulty making decisions - mostly shopping decisions.   Neck pain: she had problems a couple of weeks ago but no longer having problems.   Dyslipidemia: on Atorvastatin, no side effects. HDL is low, discussed ways to improve her HDL   Patient Active Problem List   Diagnosis Date Noted  . Obesity, Class I, BMI 30-34.9  11/12/2014  . Chronic kidney disease (CKD), stage III (moderate) 09/07/2014  . Diabetes mellitus with renal manifestations, controlled (HCC) 09/07/2014  . Adult hypothyroidism 09/07/2014  . Vitamin D deficiency 09/07/2014  . Benign essential HTN 09/07/2014  . Microalbuminuria 09/07/2014  . Dyslipidemia 09/07/2014  . Anxiety and depression 09/07/2014  . 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/07/2014  . Calculus of kidney 09/07/2014  . Memory loss or impairment 09/07/2014    Past Surgical History  Procedure Laterality Date  . Lithotripsy Left 12/2013  . Breast biopsy Right     negative times 2  . Colonoscopy with propofol N/A 01/20/2015    Procedure: COLONOSCOPY WITH PROPOFOL;  Surgeon: Scot Junobert T Elliott, MD;  [facility] END[facility]COPY;  Service: Endoscopy;  Laterality: N/A;    Family History  Problem Relation Age of Onset  . Osteoarthritis Mother   . Osteoarthritis Sister   . Alzheimer's disease Maternal Aunt     Social History   Social History  . Marital Status: Married    Spouse Name: N/A  . Number of Children: N/A  . Years of Education: N/A   Occupational History  . Not on file.   Social History Main Topics  . Smoking status: Never Smoker   . Smokeless tobacco: Never Used  . Alcohol Use: No  . Drug Use:  No  . Sexual Activity: No   Other Topics Concern  . Not on file   Social History Narrative     Current outpatient prescriptions:  .  amLODipine (NORVASC) 2.5 MG tablet, Take 1 tablet (2.5 mg total) by mouth daily., Disp: 90 tablet, Rfl: 1 .  aspirin 81 MG tablet, Take by mouth., Disp: , Rfl:  .  atorvastatin (LIPITOR) 80 MG tablet, Take 1 tablet (80 mg total) by mouth every evening., Disp: 90 tablet, Rfl: 1 .  citalopram (CELEXA) 40 MG tablet, Take 1 tablet (40 mg total) by mouth daily., Disp: 90 tablet, Rfl: 1 .  glipiZIDE (GLUCOTROL XL) 5 MG 24 hr tablet, Take 1 tablet (5 mg total) by mouth daily with breakfast., Disp: 90 tablet, Rfl: 1 .   Glucose Blood DISK, BAYER BREEZE 2 TEST (In Vitro Disk)  1 Disk check fsbs daily for 90 days  Quantity: 10;  Refills: 1   Ordered :07-Apr-2012  Alba Cory MD;  Started 07-Apr-2012 Active, Disp: , Rfl:  .  levothyroxine (SYNTHROID, LEVOTHROID) 75 MCG tablet, Take 1 tablet (75 mcg total) by mouth daily before breakfast., Disp: 90 tablet, Rfl: 1 .  losartan (COZAAR) 100 MG tablet, Take 1 tablet (100 mg total) by mouth daily., Disp: 90 tablet, Rfl: 1 .  metFORMIN (GLUCOPHAGE) 500 MG tablet, Take 1 tablet (500 mg total) by mouth 2 (two) times daily with a meal., Disp: 180 tablet, Rfl: 1 .  Multiple Vitamins-Minerals (MULTIVITAMIN WOMEN 50+) TABS, Take by mouth., Disp: , Rfl:  .  clonazePAM (KLONOPIN) 0.5 MG tablet, Take 1 tablet (0.5 mg total) by mouth 2 (two) times daily as needed for anxiety. (Patient not taking: Reported on 09/10/2015), Disp: 30 tablet, Rfl: 0  Allergies  Allergen Reactions  . Sulfa Antibiotics      ROS  Constitutional: Negative for fever or weight change.  Respiratory: Negative for cough and shortness of breath.   Cardiovascular: Negative for chest pain or palpitations.  Gastrointestinal: Negative for abdominal pain, no bowel changes.  Musculoskeletal: Negative for gait problem or joint swelling.  Skin: Negative for rash.  Neurological: Negative for dizziness or headache.  No other specific complaints in a complete review of systems (except as listed in HPI above).  Objective  Filed Vitals:   09/10/15 1019  BP: 118/78  Pulse: 89  Temp: 97.6 F (36.4 C)  TempSrc: Oral  Resp: 16  Weight: 150 lb 8 oz (68.266 kg)  SpO2: 98%    Body mass index is 29.39 kg/(m^2).  Physical Exam  Constitutional: Patient appears well-developed and well-nourished. Obese  No distress.  HEENT: head atraumatic, normocephalic, pupils equal and reactive to light, neck supple, throat within normal limits Cardiovascular: Normal rate, regular rhythm and normal heart sounds.  No murmur  heard. No BLE edema. Pulmonary/Chest: Effort normal and breath sounds normal. No respiratory distress. Abdominal: Soft.  There is no tenderness. Psychiatric: Patient has a normal mood and affect. behavior is normal. Judgment and thought content normal.  Recent Results (from the past 2160 hour(s))  CBC and differential     Status: None   Collection Time: 09/04/15 10:23 AM  Result Value Ref Range   Hemoglobin 12.6 12.0 - 16.0 g/dL   HCT 38 36 - 46 %   Neutrophils Absolute 2 /L   Platelets 303 150 - 399 K/L   WBC 5.7 10^3/mL  Basic metabolic panel     Status: None   Collection Time: 09/04/15 10:23 AM  Result Value Ref Range  Glucose 153 mg/dL   BUN 21 4 - 21 mg/dL   Creatinine 1.0 0.5 - 1.1 mg/dL   Potassium 4.7 3.4 - 5.3 mmol/L   Sodium 140 137 - 147 mmol/L  Lipid panel     Status: Abnormal   Collection Time: 09/04/15 10:23 AM  Result Value Ref Range   LDl/HDL Ratio 2.1    Triglycerides 68 40 - 160 mg/dL   Cholesterol 97 0 - 098 mg/dL   HDL 27 (A) 35 - 70 mg/dL   LDL Cholesterol 56 mg/dL  Hemoglobin J1B     Status: None   Collection Time: 09/04/15 10:23 AM  Result Value Ref Range   Hemoglobin A1C 6.8   TSH     Status: None   Collection Time: 09/04/15 10:23 AM  Result Value Ref Range   TSH 0.89 0.41 - 5.90 uIU/mL      PHQ2/9: Depression screen [facility] 2/9 09/10/2015 06/12/2015 02/12/2015 11/12/2014  Decreased Interest 0 0 0 0  Down, Depressed, Hopeless 0 0 0 0  PHQ - 2 Score 0 0 0 0     Fall Risk: Fall Risk  09/10/2015 06/12/2015 02/12/2015 11/12/2014  Falls in the past year? No Yes Yes No  Number falls in past yr: - 1 1 -  Injury with Fall? - Yes Yes -    Functional Status Survey: Is the patient deaf or have difficulty hearing?: No Does the patient have difficulty seeing, even when wearing glasses/contacts?: No Does the patient have difficulty concentrating, remembering, or making decisions?: No Does the patient have difficulty walking or climbing stairs?: No Does the  patient have difficulty dressing or bathing?: No Does the patient have difficulty doing errands alone such as visiting a doctor's office or shopping?: No   Assessment & Plan  1. Type II diabetes mellitus with nephropathy (HCC)  - metFORMIN (GLUCOPHAGE) 500 MG tablet; Take 1 tablet (500 mg total) by mouth 2 (two) times daily with a meal.  Dispense: 180 tablet; Refill: 1  2. Major Depression in Remission  Doing well at this time, continue medication  3. Chronic kidney disease (CKD), stage III (moderate)  Continue yearly follow up with Dr. Thedore Mins   4. Essential hypertension  Well controlled at this time, continue medication   5. Hypothyroidism, unspecified hypothyroidism type  At goal  6. Microalbuminuria  Continue ARB  7. Obesity, Class I, BMI 30-34.9  Doing well at this time, losing weight.   8. Dyslipidemia  Lipid panel shows low HDL : to improve HDL patient  needs to eat tree nuts ( pecans/pistachios/almonds ) four times weekly, eat fish two times weekly  and exercise  at least 150 minutes per week - atorvastatin (LIPITOR) 80 MG tablet; Take 1 tablet (80 mg total) by mouth every evening.  Dispense: 90 tablet; Refill: 1   9. Need for pneumococcal vaccination  - Pneumococcal polysaccharide vaccine 23-valent greater than or equal to 2yo subcutaneous/IM  10. Encounter for screening mammogram for breast cancer  - MM Digital Screening; Future

## 2015-10-16 ENCOUNTER — Other Ambulatory Visit: Payer: Self-pay | Admitting: Family Medicine

## 2015-10-16 ENCOUNTER — Ambulatory Visit
Admission: RE | Admit: 2015-10-16 | Discharge: 2015-10-16 | Disposition: A | Discharge status: Home / Self Care | Payer: Medicare Other | Source: Ambulatory Visit | Attending: Family Medicine | Admitting: Family Medicine

## 2015-10-16 DIAGNOSIS — Z1231 Encounter for screening mammogram for malignant neoplasm of breast: Secondary | ICD-10-CM

## 2015-10-30 ENCOUNTER — Ambulatory Visit
Admission: EM | Admit: 2015-10-30 | Discharge: 2015-10-30 | Disposition: A | Discharge status: Home / Self Care | Payer: Medicare Other | Attending: Family Medicine | Admitting: Family Medicine

## 2015-10-30 ENCOUNTER — Ambulatory Visit: Payer: Medicare Other

## 2015-10-30 ENCOUNTER — Encounter: Payer: Self-pay | Admitting: *Deleted

## 2015-10-30 DIAGNOSIS — I10 Essential (primary) hypertension: Secondary | ICD-10-CM | POA: Insufficient documentation

## 2015-10-30 DIAGNOSIS — Z7984 Long term (current) use of oral hypoglycemic drugs: Secondary | ICD-10-CM | POA: Diagnosis not present

## 2015-10-30 DIAGNOSIS — F329 Major depressive disorder, single episode, unspecified: Secondary | ICD-10-CM | POA: Diagnosis not present

## 2015-10-30 DIAGNOSIS — E039 Hypothyroidism, unspecified: Secondary | ICD-10-CM | POA: Diagnosis not present

## 2015-10-30 DIAGNOSIS — Z882 Allergy status to sulfonamides status: Secondary | ICD-10-CM | POA: Insufficient documentation

## 2015-10-30 DIAGNOSIS — S86911A Strain of unspecified muscle(s) and tendon(s) at lower leg level, right leg, initial encounter: Secondary | ICD-10-CM

## 2015-10-30 DIAGNOSIS — S86811A Strain of other muscle(s) and tendon(s) at lower leg level, right leg, initial encounter: Secondary | ICD-10-CM

## 2015-10-30 DIAGNOSIS — Z7982 Long term (current) use of aspirin: Secondary | ICD-10-CM | POA: Diagnosis not present

## 2015-10-30 DIAGNOSIS — J449 Chronic obstructive pulmonary disease, unspecified: Secondary | ICD-10-CM | POA: Diagnosis not present

## 2015-10-30 DIAGNOSIS — Z79899 Other long term (current) drug therapy: Secondary | ICD-10-CM | POA: Diagnosis not present

## 2015-10-30 DIAGNOSIS — X58XXXA Exposure to other specified factors, initial encounter: Secondary | ICD-10-CM | POA: Diagnosis not present

## 2015-10-30 DIAGNOSIS — M25461 Effusion, right knee: Secondary | ICD-10-CM

## 2015-10-30 DIAGNOSIS — F419 Anxiety disorder, unspecified: Secondary | ICD-10-CM | POA: Insufficient documentation

## 2015-10-30 DIAGNOSIS — M25561 Pain in right knee: Secondary | ICD-10-CM | POA: Diagnosis not present

## 2015-10-30 DIAGNOSIS — E785 Hyperlipidemia, unspecified: Secondary | ICD-10-CM | POA: Insufficient documentation

## 2015-10-30 LAB — GLUCOSE, CAPILLARY: Glucose-Capillary: 141 mg/dL — ABNORMAL HIGH (ref 65–99)

## 2015-10-30 MED ORDER — MELOXICAM 15 MG PO TABS: 15.0000 mg | ORAL_TABLET | Freq: Every day | ORAL | Status: DC

## 2015-10-30 NOTE — ED Notes (Signed)
Right knee pain, (lateral aspect), denies injury but states was working out on treadmill Monday using more incline and higher speed than she normally uses. No edema or deformity. Unable to bear full weight on right leg.

## 2015-10-30 NOTE — ED Provider Notes (Signed)
CSN: [id number]     Arrival date & time 10/30/15  1458 History   First MD Initiated Contact with Patient 10/30/15 1526    Nurses notes were reviewed. Chief Complaint  Patient presents with  . Knee Pain   Patient reports pain in her right knee. She was doing her usual activities getting on the treadmill and exercising. She states Monday the treadmill at a high incline which is not unusual for her but the speed of the treadmill was greater than it usually is. She found that she needed to put the speed down and was able to exercise. However she found either on Tuesday or Wednesday morning increased pain in the right knee which has progressively gotten worse. She reports the pain is on the lateral inferior aspect of the right knee.    (Consider location/radiation/quality/duration/timing/severity/associated sxs/prior Treatment) Patient is a 70 y.o. female presenting with knee pain. The history is provided by the patient. No language interpreter was used.  Knee Pain Location:  Knee Injury: yes   Mechanism of injury comment:  Twisted the right knee on treadmill Knee location:  R knee Pain details:    Quality:  Aching   Radiates to:  Does not radiate   Severity:  Moderate   Timing:  Constant   Progression:  Worsening Chronicity:  New Relieved by:  Nothing Ineffective treatments:  None tried Associated symptoms: swelling   Risk factors: no concern for non-accidental trauma and no known bone disorder     Past Medical History  Diagnosis Date  . Anxiety   . Hyperlipidemia   . Hypertension   . Depression   . COPD (chronic obstructive pulmonary disease) (HCC)   . Hypothyroidism    Past Surgical History  Procedure Laterality Date  . Lithotripsy Left 12/2013  . Breast biopsy Right     negative times 2  . Colonoscopy with propofol N/A 01/20/2015    Procedure: COLONOSCOPY WITH PROPOFOL;  Surgeon: Scot Jun, MD;  Location: [facility] ENDOSCOPY;  Service: Endoscopy;  Laterality: N/A;    Family History  Problem Relation Age of Onset  . Osteoarthritis Mother   . Osteoarthritis Sister   . Alzheimer's disease Maternal Aunt    Social History  Substance Use Topics  . Smoking status: Never Smoker   . Smokeless tobacco: Never Used  . Alcohol Use: No   OB History    No data available     Review of Systems  Musculoskeletal: Positive for myalgias and joint swelling.  All other systems reviewed and are negative.   Allergies  Sulfa antibiotics  Home Medications   Prior to Admission medications   Medication Sig Start Date End Date Taking? Authorizing Provider  amLODipine (NORVASC) 2.5 MG tablet Take 1 tablet (2.5 mg total) by mouth daily. 06/12/15  Yes Alba Cory, MD  aspirin 81 MG tablet Take by mouth.   Yes Historical Provider, MD  atorvastatin (LIPITOR) 80 MG tablet Take 1 tablet (80 mg total) by mouth every evening. 09/10/15  Yes Alba Cory, MD  citalopram (CELEXA) 40 MG tablet Take 1 tablet (40 mg total) by mouth daily. 06/12/15  Yes Alba Cory, MD  clonazePAM (KLONOPIN) 0.5 MG tablet Take 1 tablet (0.5 mg total) by mouth 2 (two) times daily as needed for anxiety. 06/12/15  Yes Alba Cory, MD  glipiZIDE (GLUCOTROL XL) 5 MG 24 hr tablet Take 1 tablet (5 mg total) by mouth daily with breakfast. 06/12/15  Yes Alba Cory, MD  Glucose Blood DISK BAYER BREEZE 2  TEST (In Vitro Disk)  1 Disk check fsbs daily for 90 days  Quantity: 10;  Refills: 1   Ordered :07-Apr-2012  [name], KRICHNA MD;  Mora ApplStarted 07-Apr-2012 Active 04/07/12  Yes Historical Provider, MD  levothyroxine (SYNTHROID, LEVOTHROID) 75 MCG tablet Take 1 tablet (75 mcg total) by mouth daily before breakfast. 06/12/15  Yes Alba CoryKrichna Sowles, MD  losartan (COZAAR) 100 MG tablet Take 1 tablet (100 mg total) by mouth daily. 06/12/15  Yes Alba CoryKrichna Sowles, MD  metFORMIN (GLUCOPHAGE) 500 MG tablet Take 1 tablet (500 mg total) by mouth 2 (two) times daily with a meal. 09/10/15  Yes Alba CoryKrichna Sowles, MD  Multiple  Vitamins-Minerals (MULTIVITAMIN WOMEN 50+) TABS Take by mouth.   Yes Historical Provider, MD  meloxicam (MOBIC) 15 MG tablet Take 1 tablet (15 mg total) by mouth daily. 10/30/15   Hassan RowanEugene Pattijo Juste, MD   Meds Ordered and Administered this Visit  Medications - No data to display  BP 133/66 mmHg  Pulse 78  Temp(Src) 97.9 F (36.6 C) (Oral)  Resp 16  Ht 5' (1.524 m)  Wt 150 lb (68.04 kg)  BMI 29.30 kg/m2  SpO2 98% No data found.   Physical Exam  Constitutional: She is oriented to person, place, and time. She appears well-developed and well-nourished.  HENT:  Head: Normocephalic and atraumatic.  Eyes: Pupils are equal, round, and reactive to light.  Neck: Normal range of motion.  Musculoskeletal: She exhibits tenderness.       Legs: Patient is fluid on the right knee. Most tenderness is along the lower lateral right inferior ligaments. Good range of motion tenderness is significant.  Neurological: She is alert and oriented to person, place, and time. No cranial nerve deficit.  Skin: Skin is warm. No erythema.  Psychiatric: She has a normal mood and affect.  Vitals reviewed.   ED Course  Procedures (including critical care time)  Labs Review Labs Reviewed  GLUCOSE, CAPILLARY - Abnormal; Notable for the following:    Glucose-Capillary 141 (*)    All other components within normal limits    Imaging Review Dg Knee Complete 4 Views Right  10/30/2015  CLINICAL DATA:  RIGHT knee pain laterally and posteriorly, no specific injury but was working out on a treadmill Monday at faster speed and steeper incline than she typically does, unable to bear full weight on RIGHT leg, RIGHT knee strain, initial encounter EXAM: RIGHT KNEE - COMPLETE 4+ VIEW COMPARISON:  None FINDINGS: Bone mineralization normal. Joint spaces preserved. No fracture, dislocation, or bone destruction. No joint effusion. IMPRESSION: Normal exam. Electronically Signed   By: Ulyses SouthwardMark  Boles M.D.   On: 10/30/2015 16:00      Visual Acuity Review  Right Eye Distance:   Left Eye Distance:   Bilateral Distance:    Right Eye Near:   Left Eye Near:    Bilateral Near:         MDM   1. Knee strain, right, initial encounter   2. Knee effusion, right     Recommend elevation of the right leg when possible ice to the right leg. We'll place on Mobic 15 mg 1 tablet a day follow-up with PCP if not better in 2 weeks and will need to rest the knee for least a week as well.   Note: This dictation was prepared with Dragon dictation along with smaller phrase technology. Any transcriptional errors that result from this process are unintentional.    Hassan RowanEugene Dave Mannes, MD 10/30/15 [id number]

## 2015-10-30 NOTE — Discharge Instructions (Signed)
Recommend getting a soft cloth knee brace or support from the pharmacy of his choice Cryotherapy Cryotherapy is when you put ice on your injury. Ice helps lessen pain and puffiness (swelling) after an injury. Ice works the best when you start using it in the first 24 to 48 hours after an injury. HOME CARE  Put a dry or damp towel between the ice pack and your skin.  You may press gently on the ice pack.  Leave the ice on for no more than 10 to 20 minutes at a time.  Check your skin after 5 minutes to make sure your skin is okay.  Rest at least 20 minutes between ice pack uses.  Stop using ice when your skin loses feeling (numbness).  Do not use ice on someone who cannot tell you when it hurts. This includes small children and people with memory problems (dementia). GET HELP RIGHT AWAY IF:  You have white spots on your skin.  Your skin turns blue or pale.  Your skin feels waxy or hard.  Your puffiness gets worse. MAKE SURE YOU:   Understand these instructions.  Will watch your condition.  Will get help right away if you are not doing well or get worse.   This information is not intended to replace advice given to you by your health care provider. Make sure you discuss any questions you have with your health care provider.   Document Released: 11/10/2007 Document Revised: 08/16/2011 Document Reviewed: 01/14/2011 Elsevier Interactive Patient Education 2016 Else[employer] Inc[employer]  Adult nurselastic Bandage and RICE WHAT DOES AN ELASTIC BANDAGE DO? Elastic bandages come in different shapes and sizes. They generally provide support to your injury and reduce swelling while you are healing, but they can perform different functions. Your health care provider will help you to decide what is best for your protection, recovery, or rehabilitation following an injury. WHAT ARE SOME GENERAL TIPS FOR USING AN ELASTIC BANDAGE?  Use the bandage as directed by the maker of the bandage that you are  using.  Do not wrap the bandage too tightly. This may cut off the circulation in the arm or leg in the area below the bandage.  If part of your body beyond the bandage becomes blue, numb, cold, swollen, or is more painful, your bandage is most likely too tight. If this occurs, remove your bandage and reapply it more loosely.  See your health care provider if the bandage seems to be making your problems worse rather than better.  An elastic bandage should be removed and reapplied every 3-4 hours or as directed by your health care provider. WHAT IS RICE? The routine care of many injuries includes rest, ice, compression, and elevation (RICE therapy).  Rest Rest is required to allow your body to heal. Generally, you can resume your routine activities when you are comfortable and have been given permission by your health care provider. Ice Icing your injury helps to keep the swelling down and it reduces pain. Do not apply ice directly to your skin.  Put ice in a plastic bag.  Place a towel between your skin and the bag.  Leave the ice on for 20 minutes, 2-3 times per day. Do this for as long as you are directed by your health care provider. Compression Compression helps to keep swelling down, gives support, and helps with discomfort. Compression may be done with an elastic bandage. Elevation Elevation helps to reduce swelling and it decreases pain. If possible, your injured area should  be placed at or above the level of your heart or the center of your chest. WHEN SHOULD I SEEK MEDICAL CARE? You should seek medical care if:  You have persistent pain and swelling.  Your symptoms are getting worse rather than improving. These symptoms may indicate that further evaluation or further X-rays are needed. Sometimes, X-rays may not show a small broken bone (fracture) until a number of days later. Make a follow-up appointment with your health care provider. Ask when your X-ray results will be  ready. Make sure that you get your X-ray results. WHEN SHOULD I SEEK IMMEDIATE MEDICAL CARE? You should seek immediate medical care if:  You have a sudden onset of severe pain at or below the area of your injury.  You develop redness or increased swelling around your injury.  You have tingling or numbness at or below the area of your injury that does not improve after you remove the elastic bandage.   This information is not intended to replace advice given to you by your health care provider. Make sure you discuss any questions you have with your health care provider.   Document Released: 11/13/2001 Document Revised: 02/12/2015 Document Reviewed: 01/07/2014 Elsevier Interactive Patient Education 2016 Else[employer] Inc[employer]  Knee Effusion Knee effusion means that you have extra fluid in your knee. This can cause pain. Your knee may be more difficult to bend and move. HOME CARE  Use crutches as told by your doctor.  Wear a knee brace as told by your doctor.  Apply ice to the swollen area:  Put ice in a plastic bag.  Place a towel between your skin and the bag.  Leave the ice on for 20 minutes, 2-3 times per day.  Keep your knee raised (elevated) when you are sitting or lying down.  Take medicines only as told by your doctor.  Do any rehabilitation or strengthening exercises as told by your doctor.  Rest your knee as told by your doctor. You may start doing your normal activities again when your doctor says it is okay.  Keep all follow-up visits as told by your doctor. This is important. GET HELP IF:   You continue to have pain in your knee. GET HELP RIGHT AWAY IF:  You have increased swelling or redness of your knee.  You have severe pain in your knee.  You have a fever.   This information is not intended to replace advice given to you by your health care provider. Make sure you discuss any questions you have with your health care provider.   Document Released: 06/26/2010  Document Revised: 06/14/2014 Document Reviewed: 01/07/2014 Elsevier Interactive Patient Education 2016 Elsevier Inc.  Muscle Strain A muscle strain (pulled muscle) happens when a muscle is stretched beyond normal length. It happens when a sudden, violent force stretches your muscle too far. Usually, a few of the fibers in your muscle are torn. Muscle strain is common in athletes. Recovery usually takes 1-2 weeks. Complete healing takes 5-6 weeks.  HOME CARE   Follow the PRICE method of treatment to help your injury get better. Do this the first 2-3 days after the injury:  Protect. Protect the muscle to keep it from getting injured again.  Rest. Limit your activity and rest the injured body part.  Ice. Put ice in a plastic bag. Place a towel between your skin and the bag. Then, apply the ice and leave it on from 15-20 minutes each hour. After the third day, switch to moist  heat packs.  Compression. Use a splint or elastic bandage on the injured area for comfort. Do not put it on too tightly.  Elevate. Keep the injured body part above the level of your heart.  Only take medicine as told by your doctor.  Warm up before doing exercise to prevent future muscle strains. GET HELP IF:   You have more pain or puffiness (swelling) in the injured area.  You feel numbness, tingling, or notice a loss of strength in the injured area. MAKE SURE YOU:   Understand these instructions.  Will watch your condition.  Will get help right away if you are not doing well or get worse.   This information is not intended to replace advice given to you by your health care provider. Make sure you discuss any questions you have with your health care provider.   Document Released: 03/02/2008 Document Revised: 03/14/2013 Document Reviewed: 12/21/2012 Elsevier Interactive Patient Education [employer]

## 2015-12-03 ENCOUNTER — Other Ambulatory Visit: Payer: Self-pay | Admitting: Family Medicine

## 2015-12-16 ENCOUNTER — Other Ambulatory Visit: Payer: Self-pay | Admitting: Family Medicine

## 2015-12-17 NOTE — Telephone Encounter (Signed)
Patient requesting refill. 

## 2016-01-12 ENCOUNTER — Encounter: Payer: Self-pay | Admitting: Family Medicine

## 2016-01-12 ENCOUNTER — Ambulatory Visit (INDEPENDENT_AMBULATORY_CARE_PROVIDER_SITE_OTHER): Payer: Medicare Other | Admitting: Family Medicine

## 2016-01-12 VITALS — BP 116/64 | HR 88 | Temp 98.1°F | Resp 18 | Ht 60.0 in | Wt 153.5 lb

## 2016-01-12 DIAGNOSIS — L609 Nail disorder, unspecified: Secondary | ICD-10-CM | POA: Diagnosis not present

## 2016-01-12 DIAGNOSIS — N183 Chronic kidney disease, stage 3 unspecified: Secondary | ICD-10-CM

## 2016-01-12 DIAGNOSIS — E039 Hypothyroidism, unspecified: Secondary | ICD-10-CM

## 2016-01-12 DIAGNOSIS — E1121 Type 2 diabetes mellitus with diabetic nephropathy: Secondary | ICD-10-CM | POA: Diagnosis not present

## 2016-01-12 DIAGNOSIS — F325 Major depressive disorder, single episode, in full remission: Secondary | ICD-10-CM | POA: Diagnosis not present

## 2016-01-12 DIAGNOSIS — I10 Essential (primary) hypertension: Secondary | ICD-10-CM | POA: Diagnosis not present

## 2016-01-12 DIAGNOSIS — Z0000 Encounter for general adult medical examination without abnormal findings: Secondary | ICD-10-CM

## 2016-01-12 DIAGNOSIS — E785 Hyperlipidemia, unspecified: Secondary | ICD-10-CM | POA: Diagnosis not present

## 2016-01-12 LAB — COMPLETE METABOLIC PANEL WITH GFR
ALT: 36 U/L — ABNORMAL HIGH (ref 6–29)
AST: 62 U/L — ABNORMAL HIGH (ref 10–35)
Albumin: 4 g/dL (ref 3.6–5.1)
Alkaline Phosphatase: 105 U/L (ref 33–130)
BUN: 21 mg/dL (ref 7–25)
CALCIUM: 9.8 mg/dL (ref 8.6–10.4)
CHLORIDE: 104 mmol/L (ref 98–110)
CO2: 21 mmol/L (ref 20–31)
Creat: 0.9 mg/dL (ref 0.50–0.99)
GFR, EST AFRICAN AMERICAN: 76 mL/min (ref >=60)
GFR, EST NON AFRICAN AMERICAN: 66 mL/min (ref >=60)
Glucose, Bld: 134 mg/dL — ABNORMAL HIGH (ref 65–99)
POTASSIUM: 4.9 mmol/L (ref 3.5–5.3)
Sodium: 138 mmol/L (ref 135–146)
Total Bilirubin: 0.6 mg/dL (ref 0.2–1.2)
Total Protein: 7.3 g/dL (ref 6.1–8.1)

## 2016-01-12 LAB — TSH: TSH: 0.98 m[IU]/L

## 2016-01-12 MED ORDER — CITALOPRAM HYDROBROMIDE 40 MG PO TABS: 40.0000 mg | ORAL_TABLET | Freq: Every day | ORAL | 1 refills | Status: DC

## 2016-01-12 MED ORDER — GLIPIZIDE ER 5 MG PO TB24: 5.0000 mg | ORAL_TABLET | Freq: Every day | ORAL | 1 refills | Status: DC

## 2016-01-12 NOTE — Progress Notes (Signed)
Name: Megan Short   MRN: [id number]    DOB: December 28, 1945   Date:01/12/2016       Progress Note  Subjective  Chief Complaint  Chief Complaint  Patient presents with  . Annual Exam    HPI  Functional ability/safety issues: No Issues Hearing issues: Addressed  Activities of daily living: Discussed Home safety issues: No Issues  End Of Life Planning: Offered verbal information regarding advanced directives, healthcare power of attorney.  Preventative care, Health maintenance, Preventative health measures discussed.  Preventative screenings discussed today: lab work, colonoscopy,  mammogram, DEXA.  Low Dose CT Chest recommended if Age 23-80 years, 30 pack-year currently smoking OR have quit w/in 15years.   Lifestyle risk factor issued reviewed: Diet, exercise, weight management, advised patient smoking is not healthy, nutrition/diet.  Preventative health measures discussed (5-10 year plan).  Reviewed and recommended vaccinations: - Pneumovax  - Prevnar  - Annual Influenza - Zostavax - Tdap   Depression screening: Done - taking Celexa Fall risk screening: Done Discuss ADLs/IADLs: Done  Current medical providers: See HPI  Other health risk factors identified this visit: No other issues Cognitive impairment issues: None identified  All above discussed with patient. Appropriate education, counseling and referral will be made based upon the above.   HTN: taking medication and denies side effects. No chest pain, no palpitation, no orthostatic changes  Hypothyroidism: no dry skin or constipation She is taking Levothyroxine as prescribed  DMII: fsbs is at goal, fasting in the 100's. Highest 189 after she ate [employer] the night before. She is taking Metformin twice dailyShe is exercise 5 days a week, last hgbA1C was 6.8% . She denies polyphagia, polydipsia or polyuria. She is taking ARB and last  urine micro has improved down from 100 to 50 . Sees Dr. Alonna Buckler:  Sees  Nephrologist yearly - Dr. Thedore Mins, denies decrease in urinary volume  Major Depression: it was caused by death of her son 9 years ago, she is doing well on Celexa and BZD's very seldom.  She denies anhedonia, or crying spells, still has difficulty making decisions - mostly shopping decisions - choosing between two different blouses.   Dyslipidemia: on Atorvastatin, no side effects. HDL is low, discussed ways to improve her HDL  Patient Active Problem List   Diagnosis Date Noted  . Obesity, Class I, BMI 30-34.9 11/12/2014  . Chronic kidney disease (CKD), stage III (moderate) 09/07/2014  . Diabetes mellitus with renal manifestations, controlled (HCC) 09/07/2014  . Adult hypothyroidism 09/07/2014  . Vitamin D deficiency 09/07/2014  . Benign essential HTN 09/07/2014  . Microalbuminuria 09/07/2014  . Dyslipidemia 09/07/2014  . Major depression in full remission (HCC) 09/07/2014  . 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/07/2014  . Calculus of kidney 09/07/2014  . Memory loss or impairment 09/07/2014    Past Surgical History:  Procedure Laterality Date  . BREAST BIOPSY Right    negative times 2  . COLONOSCOPY WITH PROPOFOL N/A 01/20/2015   Procedure: COLONOSCOPY WITH PROPOFOL;  Surgeon: Scot Jun, MD;  Location: [facility] END[facility]COPY;  Service: Endoscopy;  Laterality: N/A;  . LITHOTRIPSY Left 12/2013    Family History  Problem Relation Age of Onset  . Osteoarthritis Mother   . Osteoarthritis Sister   . Alzheimer's disease Maternal Aunt     Social History   Social History  . Marital status: Married    Spouse name: N/A  . Number of children: N/A  . Years of education: N/A   Occupational  History  . Not on file.   Social History Main Topics  . Smoking status: Never Smoker  . Smokeless tobacco: Never Used  . Alcohol use No  . Drug use: No  . Sexual activity: No   Other Topics Concern  . Not on file   Social History Narrative  . No narrative on file      Current Outpatient Prescriptions:  .  amLODipine (NORVASC) 2.5 MG tablet, TAKE ONE TABLET BY MOUTH ONCE DAILY, Disp: 90 tablet, Rfl: 0 .  aspirin 81 MG tablet, Take by mouth., Disp: , Rfl:  .  atorvastatin (LIPITOR) 80 MG tablet, Take 1 tablet (80 mg total) by mouth every evening., Disp: 90 tablet, Rfl: 1 .  citalopram (CELEXA) 40 MG tablet, Take 1 tablet (40 mg total) by mouth daily., Disp: 90 tablet, Rfl: 1 .  clonazePAM (KLONOPIN) 0.5 MG tablet, TAKE ONE TABLET BY MOUTH TWICE DAILY AS NEEDED, Disp: 30 tablet, Rfl: 0 .  glipiZIDE (GLUCOTROL XL) 5 MG 24 hr tablet, Take 1 tablet (5 mg total) by mouth daily with breakfast., Disp: 90 tablet, Rfl: 1 .  Glucose Blood DISK, BAYER BREEZE 2 TEST (In Vitro Disk)  1 Disk check fsbs daily for 90 days  Quantity: 10;  Refills: 1   Ordered :07-Apr-2012  [name], [name] MD;  Started 07-Apr-2012 Active, Disp: , Rfl:  .  levothyroxine (SYNTHROID, LEVOTHROID) 75 MCG tablet, TAKE ONE TABLET BY MOUTH ONCE DAILY BEFORE BREAKFAST, Disp: 90 tablet, Rfl: 0 .  losartan (COZAAR) 100 MG tablet, TAKE ONE TABLET BY MOUTH ONCE DAILY, Disp: 90 tablet, Rfl: 0 .  metFORMIN (GLUCOPHAGE) 500 MG tablet, Take 1 tablet (500 mg total) by mouth 2 (two) times daily with a meal., Disp: 180 tablet, Rfl: 1 .  Multiple Vitamins-Minerals (MULTIVITAMIN WOMEN 50+) TABS, Take by mouth., Disp: , Rfl:   Allergies  Allergen Reactions  . Sulfa Antibiotics      ROS  Constitutional: Negative for fever or weight change.  Respiratory: Negative for cough and shortness of breath.   Cardiovascular: Negative for chest pain or palpitations.  Gastrointestinal: Negative for abdominal pain, no bowel changes.  Musculoskeletal: Negative for gait problem or joint swelling.  Skin: Negative for rash.  Neurological: Negative for dizziness or headache.  No other specific complaints in a complete review of systems (except as listed in HPI above).  Objective  Vitals:   01/12/16 0826  BP: 116/64   Pulse: 88  Resp: 18  Temp: 98.1 F (36.7 C)  TempSrc: Oral  SpO2: 96%  Weight: 153 lb 8 oz (69.6 kg)  Height: 5' (1.524 m)    Body mass index is 29.98 kg/m.  Physical Exam  Constitutional: Patient appears well-developed and overweight  No distress.  HENT: Head: Normocephalic and atraumatic. Ears: B TMs ok, no erythema or effusion; Nose: Nose normal. Mouth/Throat: Oropharynx is clear and moist. No oropharyngeal exudate.  Eyes: Conjunctivae and EOM are normal. Pupils are equal, round, and reactive to light. No scleral icterus.  Neck: Normal range of motion. Neck supple. No JVD present. No thyromegaly present.  Cardiovascular: Normal rate, regular rhythm and normal heart sounds.  No murmur heard. No BLE edema. Pulmonary/Chest: Effort normal and breath sounds normal. No respiratory distress. Abdominal: Soft. Bowel sounds are normal, no distension. There is no tenderness. no masses Breast: no lumps or masses, no nipple discharge or rashes FEMALE GENITALIA:  External genitalia normal External urethra normal RECTAL: not done Musculoskeletal: Normal range of motion, no joint effusions. No  gross deformities Neurological: he is alert and oriented to person, place, and time. No cranial nerve deficit. Coordination, balance, strength, speech and gait are normal.  Skin: Skin is warm and dry. No rash noted. No erythema.  Psychiatric: Patient has a normal mood and affect. behavior is normal. Judgment and thought content normal.  Recent Results (from the past 2160 hour(s))  Glucose, capillary     Status: Abnormal   Collection Time: 10/30/15  3:32 PM  Result Value Ref Range   Glucose-Capillary 141 (H) 65 - 99 mg/dL    Diabetic Foot Exam: Diabetic Foot Exam - Simple   Simple Foot Form Diabetic Foot exam was performed with the following findings:  Yes 01/12/2016  8:59 AM  Visual Inspection See comments:  Yes Sensation Testing Intact to touch and monofilament testing bilaterally:  Yes Pulse  Check Posterior Tibialis and Dorsalis pulse intact bilaterally:  Yes Comments Thick toe nails      PHQ2/9: Depression screen [facility] 2/9 01/12/2016 09/10/2015 06/12/2015 02/12/2015 11/12/2014  Decreased Interest 0 0 0 0 0  Down, Depressed, Hopeless 0 0 0 0 0  PHQ - 2 Score 0 0 0 0 0     Fall Risk: Fall Risk  01/12/2016 09/10/2015 06/12/2015 02/12/2015 11/12/2014  Falls in the past year? Yes No Yes Yes No  Number falls in past yr: 1 - 1 1 -  Injury with Fall? Yes - Yes Yes -    Functional Status Survey: Is the patient deaf or have difficulty hearing?: No Does the patient have difficulty seeing, even when wearing glasses/contacts?: No Does the patient have difficulty concentrating, remembering, or making decisions?: No Does the patient have difficulty walking or climbing stairs?: No Does the patient have difficulty dressing or bathing?: No Does the patient have difficulty doing errands alone such as visiting a doctor's office or shopping?: No    Assessment & Plan  1. Medicare annual wellness visit, subsequent  Discussed importance of 150 minutes of physical activity weekly, eat two servings of fish weekly, eat one serving of tree nuts ( cashews, pistachios, pecans, almonds.Marland Kitchen) every other day, eat 6 servings of fruit/vegetables daily and drink plenty of water and avoid sweet beverages.   2. Type II diabetes mellitus with nephropathy (HCC)  - glipiZIDE (GLUCOTROL XL) 5 MG 24 hr tablet; Take 1 tablet (5 mg total) by mouth daily with breakfast.  Dispense: 90 tablet; Refill: 1 - Hemoglobin A1c  3. Essential hypertension  - COMPLETE METABOLIC PANEL WITH GFR  4. Dyslipidemia  Labs up to date  5. Hypothyroidism, unspecified hypothyroidism type  - TSH  6. Nail abnormality  -podiatrist referral   7. Major depression in remission (HCC)  - citalopram (CELEXA) 40 MG tablet; Take 1 tablet (40 mg total) by mouth daily.  Dispense: 90 tablet; Refill: 1  8. Chronic kidney disease (CKD), stage III  (moderate)  - COMPLETE METABOLIC PANEL WITH GFR

## 2016-01-13 LAB — HEMOGLOBIN A1C
HEMOGLOBIN A1C: 6.6 % — AB (ref <5.7)
MEAN PLASMA GLUCOSE: 143 mg/dL

## 2016-01-27 ENCOUNTER — Encounter: Payer: Self-pay | Admitting: Podiatry

## 2016-01-27 ENCOUNTER — Ambulatory Visit (INDEPENDENT_AMBULATORY_CARE_PROVIDER_SITE_OTHER): Payer: Medicare Other | Admitting: Podiatry

## 2016-01-27 VITALS — BP 124/72 | HR 82 | Resp 18

## 2016-01-27 DIAGNOSIS — B351 Tinea unguium: Secondary | ICD-10-CM

## 2016-01-27 DIAGNOSIS — M79674 Pain in right toe(s): Secondary | ICD-10-CM | POA: Diagnosis not present

## 2016-01-27 DIAGNOSIS — E119 Type 2 diabetes mellitus without complications: Secondary | ICD-10-CM | POA: Diagnosis not present

## 2016-01-27 DIAGNOSIS — M79675 Pain in left toe(s): Secondary | ICD-10-CM

## 2016-01-27 NOTE — Progress Notes (Signed)
   Subjective:    Patient ID: Megan Short, female    DOB: 1945/12/01, 70 y.o.   MRN: [id number]  HPI  70 year old female presents the office they for intensive toenails are thick and discolored but she cannot trim herself. Sitting on them for several years. Denies any swelling or redness or any drainage. No other complaints at this time and no other concerns.  Last A1c was 6.6. Denies any claudication symptoms.   Review of Systems  All other systems reviewed and are negative.      Objective:   Physical Exam General: AAO x3, NAD  Dermatological: Nails are hypertrophic, dystrophic, brittle, discolored, elongated 10. No surrounding redness or drainage. Tenderness nails 1-5 bilaterally. No open lesions or pre-ulcerative lesions are identified today.  Vascular: Dorsalis Pedis artery and Posterior Tibial artery pedal pulses are 2/4 bilateral with immedate capillary fill time. There is no pain with calf compression, swelling, warmth, erythema.   Neruologic: Grossly intact via light touch bilateral. Vibratory intact via tuning fork bilateral. Protective threshold with Semmes Wienstein monofilament intact to all pedal sites bilateral.   Musculoskeletal: No gross boney pedal deformities bilateral. No pain, crepitus, or limitation noted with foot and ankle range of motion bilateral. Muscular strength 5/5 in all groups tested bilateral.  Gait: Unassisted, Nonantalgic.    Assessment & Plan:  70 year old female with symptomatic onychomycosis -Treatment options discussed including all alternatives, risks, and complications -Etiology of symptoms were discussed -Nails debrided 10 without complications or bleeding. -Discussed treatment options for onychomycosis. She will try over-the-counter topical  treatment for now. -Daily foot inspection -Follow-up in 3 months or sooner if any problems arise. In the meantime, encouraged to call the office with any questions, concerns, change in symptoms.    Ovid CurdMatthew Wagoner, DPM

## 2016-02-12 ENCOUNTER — Other Ambulatory Visit: Payer: Self-pay | Admitting: Family Medicine

## 2016-02-12 DIAGNOSIS — E1129 Type 2 diabetes mellitus with other diabetic kidney complication: Secondary | ICD-10-CM

## 2016-03-08 ENCOUNTER — Other Ambulatory Visit: Payer: Self-pay | Admitting: Family Medicine

## 2016-03-08 IMAGING — CR DG ABDOMEN 1V
1 series · 1 of 1 positions shown · non-contrast
Comparison: None.

CLINICAL DATA: Kidney stone.  Right lower quadrant pain.

EXAM:
ABDOMEN - 1 VIEW

[kdxr kidney ureter bladder]
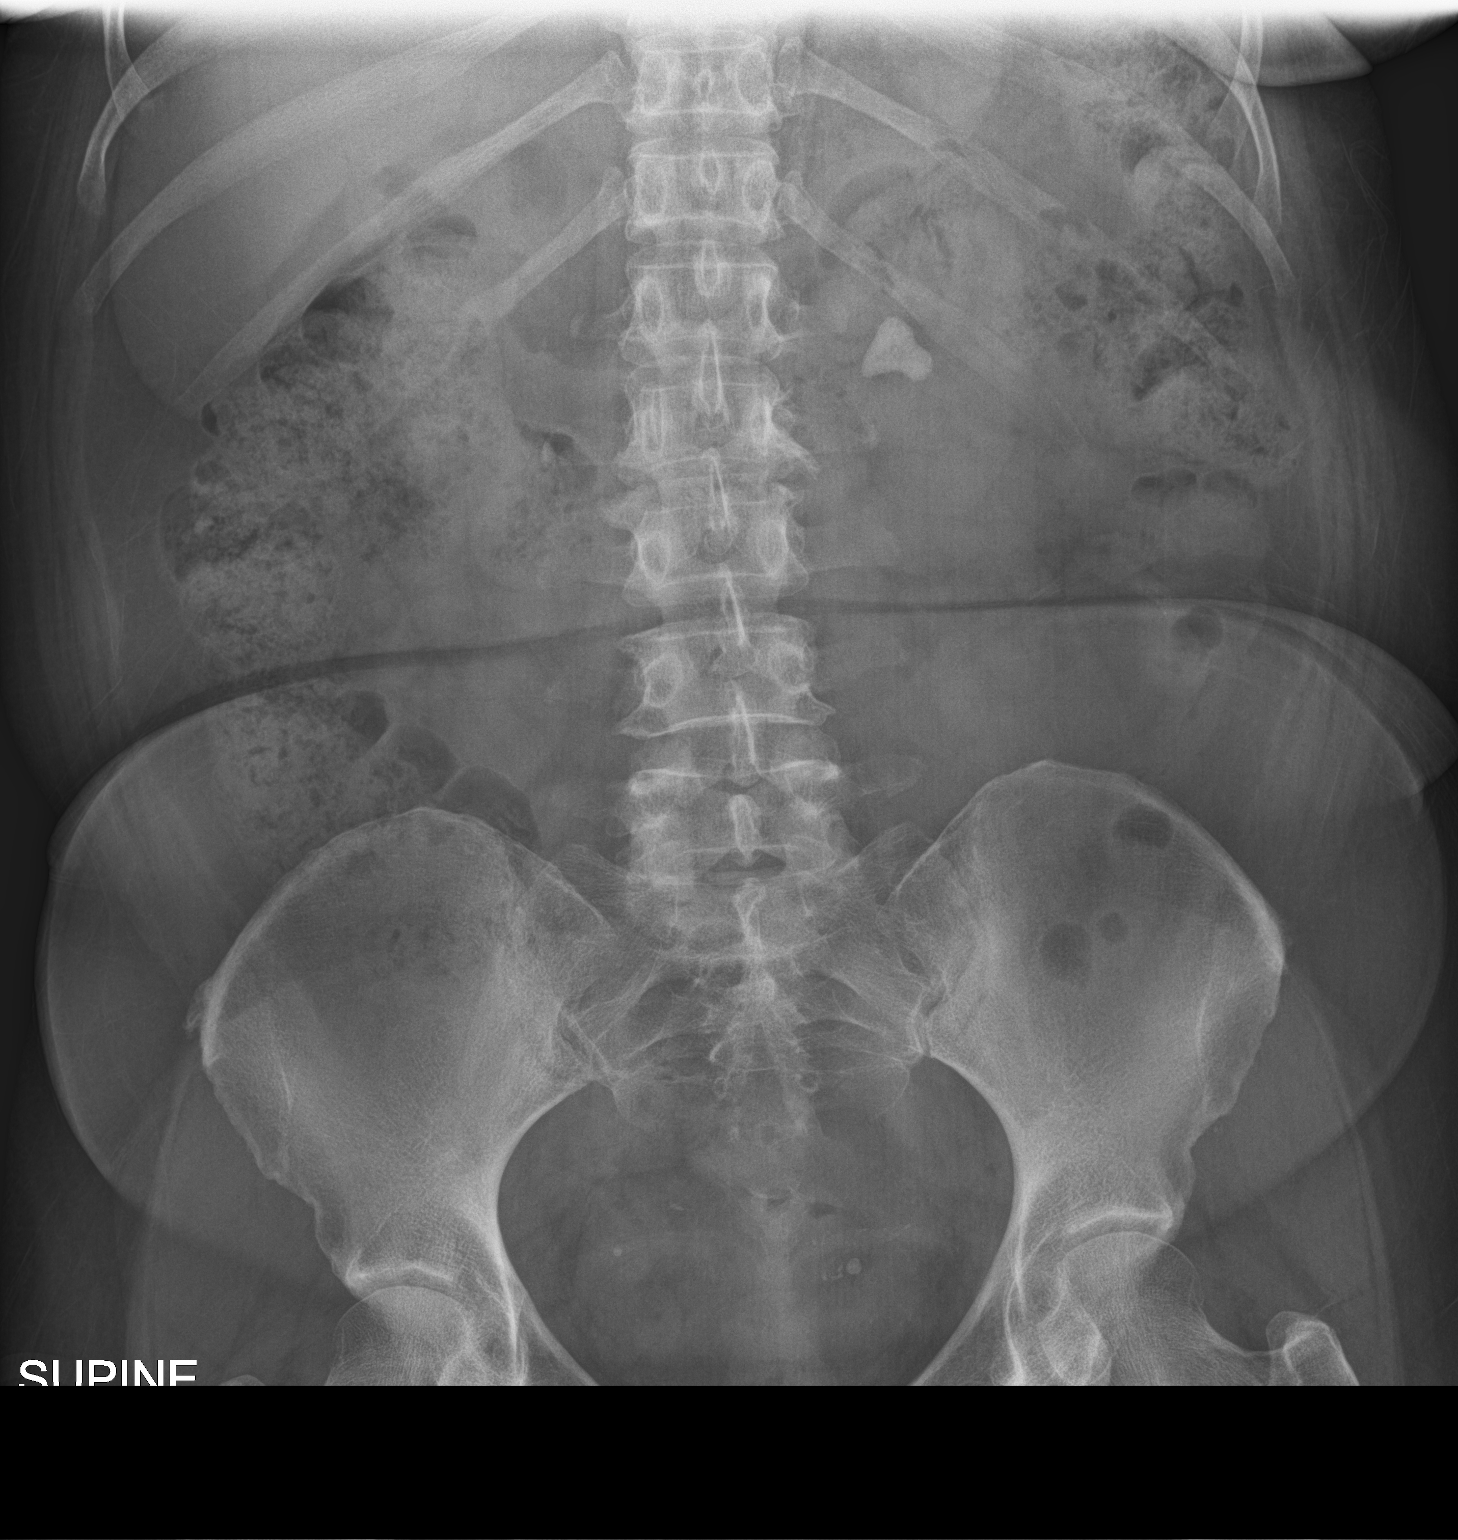

[1 of 1 positions shown; findings below may reference images not displayed]

FINDINGS: There is a triangular 2 cm stone in the left renal pelvis. No
visible right renal calculi. There are several calcifications in the
pelvis which are most likely vascular in origin.

The bowel gas pattern is normal.  No osseous abnormality.
IMPRESSION: No acute abnormality.  Large stone in the left renal pelvis.

## 2016-03-08 NOTE — Telephone Encounter (Signed)
Patient requesting refill of Synthroid, Losartan to [facility].

## 2016-04-01 DIAGNOSIS — E119 Type 2 diabetes mellitus without complications: Secondary | ICD-10-CM | POA: Diagnosis not present

## 2016-04-14 ENCOUNTER — Encounter: Payer: Self-pay | Admitting: Family Medicine

## 2016-04-15 ENCOUNTER — Other Ambulatory Visit: Payer: Self-pay | Admitting: Family Medicine

## 2016-04-17 ENCOUNTER — Other Ambulatory Visit: Payer: Self-pay | Admitting: Family Medicine

## 2016-04-17 MED ORDER — AMLODIPINE BESYLATE 2.5 MG PO TABS: 2.5000 mg | ORAL_TABLET | Freq: Every day | ORAL | 0 refills | Status: DC

## 2016-04-19 ENCOUNTER — Other Ambulatory Visit: Payer: Self-pay | Admitting: Family Medicine

## 2016-04-19 DIAGNOSIS — E785 Hyperlipidemia, unspecified: Secondary | ICD-10-CM

## 2016-04-22 ENCOUNTER — Ambulatory Visit: Payer: Medicare Other | Admitting: Podiatry

## 2016-05-06 ENCOUNTER — Encounter: Payer: Self-pay | Admitting: Podiatry

## 2016-05-06 ENCOUNTER — Ambulatory Visit (INDEPENDENT_AMBULATORY_CARE_PROVIDER_SITE_OTHER): Payer: Medicare Other | Admitting: Podiatry

## 2016-05-06 DIAGNOSIS — L603 Nail dystrophy: Secondary | ICD-10-CM

## 2016-05-06 DIAGNOSIS — E0843 Diabetes mellitus due to underlying condition with diabetic autonomic (poly)neuropathy: Secondary | ICD-10-CM

## 2016-05-06 DIAGNOSIS — B351 Tinea unguium: Secondary | ICD-10-CM

## 2016-05-06 DIAGNOSIS — L608 Other nail disorders: Secondary | ICD-10-CM

## 2016-05-06 DIAGNOSIS — M79609 Pain in unspecified limb: Secondary | ICD-10-CM

## 2016-05-08 ENCOUNTER — Encounter: Payer: Self-pay | Admitting: Family Medicine

## 2016-05-09 NOTE — Progress Notes (Signed)
SUBJECTIVE Patient with a history of diabetes mellitus presents to office today complaining of elongated, thickened nails. Pain while ambulating in shoes. Patient is unable to trim their own nails.   Allergies  Allergen Reactions  . Sulfa Antibiotics     OBJECTIVE General Patient is awake, alert, and oriented x 3 and in no acute distress. Derm Skin is dry and supple bilateral. Negative open lesions or macerations. Remaining integument unremarkable. Nails are tender, long, thickened and dystrophic with subungual debris, consistent with onychomycosis, 1-5 bilateral. No signs of infection noted. Vasc  DP and PT pedal pulses palpable bilaterally. Temperature gradient within normal limits.  Neuro Epicritic and protective threshold sensation diminished bilaterally.  Musculoskeletal Exam No symptomatic pedal deformities noted bilateral. Muscular strength within normal limits.  ASSESSMENT 1. Diabetes Mellitus w/ peripheral neuropathy 2. Onychomycosis of nail due to dermatophyte bilateral 3. Pain in foot bilateral  PLAN OF CARE 1. Patient evaluated today. 2. Instructed to maintain good pedal hygiene and foot care. Stressed importance of controlling blood sugar.  3. Mechanical debridement of nails 1-5 bilaterally performed using a nail nipper. Filed with dremel without incident.  4. Return to clinic in 3 mos.    Felecia ShellingBrent M Keith Felten, DPM

## 2016-05-13 ENCOUNTER — Ambulatory Visit (INDEPENDENT_AMBULATORY_CARE_PROVIDER_SITE_OTHER): Payer: Medicare Other | Admitting: Family Medicine

## 2016-05-13 ENCOUNTER — Encounter: Payer: Self-pay | Admitting: Family Medicine

## 2016-05-13 VITALS — BP 124/68 | HR 88 | Temp 97.6°F | Resp 18 | Ht 60.0 in | Wt 152.1 lb

## 2016-05-13 DIAGNOSIS — F325 Major depressive disorder, single episode, in full remission: Secondary | ICD-10-CM | POA: Diagnosis not present

## 2016-05-13 DIAGNOSIS — R809 Proteinuria, unspecified: Secondary | ICD-10-CM | POA: Diagnosis not present

## 2016-05-13 DIAGNOSIS — I10 Essential (primary) hypertension: Secondary | ICD-10-CM

## 2016-05-13 DIAGNOSIS — Z23 Encounter for immunization: Secondary | ICD-10-CM | POA: Diagnosis not present

## 2016-05-13 DIAGNOSIS — E038 Other specified hypothyroidism: Secondary | ICD-10-CM

## 2016-05-13 DIAGNOSIS — E785 Hyperlipidemia, unspecified: Secondary | ICD-10-CM

## 2016-05-13 DIAGNOSIS — E1129 Type 2 diabetes mellitus with other diabetic kidney complication: Secondary | ICD-10-CM

## 2016-05-13 LAB — COMPLETE METABOLIC PANEL WITH GFR
ALT: 31 U/L — ABNORMAL HIGH (ref 6–29)
AST: 55 U/L — AB (ref 10–35)
Albumin: 3.9 g/dL (ref 3.6–5.1)
Alkaline Phosphatase: 110 U/L (ref 33–130)
BUN: 17 mg/dL (ref 7–25)
CHLORIDE: 107 mmol/L (ref 98–110)
CO2: 25 mmol/L (ref 20–31)
Calcium: 9.8 mg/dL (ref 8.6–10.4)
Creat: 1.03 mg/dL — ABNORMAL HIGH (ref 0.50–0.99)
GFR, Est African American: 64 mL/min (ref >=60)
GFR, Est Non African American: 56 mL/min — ABNORMAL LOW (ref >=60)
GLUCOSE: 113 mg/dL — AB (ref 65–99)
POTASSIUM: 4.2 mmol/L (ref 3.5–5.3)
SODIUM: 140 mmol/L (ref 135–146)
Total Bilirubin: 0.8 mg/dL (ref 0.2–1.2)
Total Protein: 7.1 g/dL (ref 6.1–8.1)

## 2016-05-13 LAB — TSH: TSH: 2.43 m[IU]/L

## 2016-05-13 LAB — LIPID PANEL
CHOL/HDL RATIO: 3.3 ratio (ref <5.0)
Cholesterol: 103 mg/dL (ref <200)
HDL: 31 mg/dL — ABNORMAL LOW (ref >50)
LDL Cholesterol: 59 mg/dL (ref <100)
Triglycerides: 66 mg/dL (ref <150)
VLDL: 13 mg/dL (ref <30)

## 2016-05-13 LAB — POCT GLYCOSYLATED HEMOGLOBIN (HGB A1C): HEMOGLOBIN A1C: 6.4

## 2016-05-13 MED ORDER — CLONAZEPAM 0.5 MG PO TABS: 0.5000 mg | ORAL_TABLET | Freq: Two times a day (BID) | ORAL | 0 refills | Status: DC | PRN

## 2016-05-13 MED ORDER — CITALOPRAM HYDROBROMIDE 40 MG PO TABS: 40.0000 mg | ORAL_TABLET | Freq: Every day | ORAL | 1 refills | Status: DC

## 2016-05-13 MED ORDER — AMLODIPINE BESYLATE 2.5 MG PO TABS: 2.5000 mg | ORAL_TABLET | Freq: Every day | ORAL | 0 refills | Status: DC

## 2016-05-13 NOTE — Progress Notes (Signed)
Name: Megan JesterBrenda Short   MRN: [id number]    DOB: 03-06-1946   Date:05/13/2016       Progress Note  Subjective  Chief Complaint  Chief Complaint  Patient presents with  . Medication Refill    4 month F/U  . Diabetes    Checks once a day in the mornings, Average Low to Middle 100's  . Hypertension    Denies any symptoms  . Depression    Stable  . Dyslipidemia  . Hypothyroidism    Hair Loss and Hot flashes    HPI  HTN: taking medication and denies side effects. No chest pain, no palpitation, headaches or orthostatic changes  Hypothyroidism: denies fatigue, dry skin or constipation She is taking Levothyroxine as prescribed  DMII: fsbs is at goal, lowest has been 89, highest 189. Usually 100-140 fasting. . She is taking Metformin twice daily, and Glipizide 5mg   She is exercise 4-5 days a week, last hgbA1C was 6.6% and today is down to 6.4%. She denies polyphagia, polydipsia or polyuria. She is taking ARB and last  urine micro has improved down from 100 to 50 . Sees Dr. Alonna BucklerSighn   CKI: Sees Nephrologist yearly - Dr. Thedore MinsSingh, denies decrease in urinary volume, no itching, taking ARB  Major Depression: it was caused by death of her son 9 years ago, she is doing well on Celexa and BZD's very seldom. She denies anhedonia, or crying spells, still has difficulty making decisions , but improving. She has been exercising, 4-5 times a week, going out.   Dyslipidemia: on Atorvastatin, no side effects. HDL is low, discussed ways to improve her HDL  Patient Active Problem List   Diagnosis Date Noted  . Obesity, Class I, BMI 30-34.9 11/12/2014  . Chronic kidney disease (CKD), stage III (moderate) 09/07/2014  . Diabetes mellitus with renal manifestations, controlled (HCC) 09/07/2014  . Adult hypothyroidism 09/07/2014  . Vitamin D deficiency 09/07/2014  . Benign essential HTN 09/07/2014  . Microalbuminuria 09/07/2014  . Dyslipidemia 09/07/2014  . Major depression in full remission (HCC) 09/07/2014   . 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/07/2014  . Calculus of kidney 09/07/2014  . Memory loss or impairment 09/07/2014    Past Surgical History:  Procedure Laterality Date  . BREAST BIOPSY Right    negative times 2  . COLONOSCOPY WITH PROPOFOL N/A 01/20/2015   Procedure: COLONOSCOPY WITH PROPOFOL;  Surgeon: Scot Junobert T Elliott, MD;  [facility];  Service: Endoscopy;  Laterality: N/A;  . LITHOTRIPSY Left 12/2013    Family History  Problem Relation Age of Onset  . Osteoarthritis Mother   . Osteoarthritis Sister   . Alzheimer's disease Maternal Aunt     Social History   Social History  . Marital status: Married    Spouse name: N/A  . Number of children: N/A  . Years of education: N/A   Occupational History  . Not on file.   Social History Main Topics  . Smoking status: Never Smoker  . Smokeless tobacco: Never Used  . Alcohol use No  . Drug use: No  . Sexual activity: No   Other Topics Concern  . Not on file   Social History Narrative  . No narrative on file     Current Outpatient Prescriptions:  .  amLODipine (NORVASC) 2.5 MG tablet, Take 1 tablet (2.5 mg total) by mouth daily., Disp: 90 tablet, Rfl: 0 .  aspirin 81 MG tablet, Take by mouth., Disp: , Rfl:  .  atorvastatin (LIPITOR)  80 MG tablet, TAKE ONE TABLET BY MOUTH IN THE EVENING, Disp: 90 tablet, Rfl: 1 .  citalopram (CELEXA) 40 MG tablet, Take 1 tablet (40 mg total) by mouth daily., Disp: 90 tablet, Rfl: 1 .  clonazePAM (KLONOPIN) 0.5 MG tablet, Take 1 tablet (0.5 mg total) by mouth 2 (two) times daily as needed., Disp: 30 tablet, Rfl: 0 .  glipiZIDE (GLUCOTROL XL) 5 MG 24 hr tablet, Take 1 tablet (5 mg total) by mouth daily with breakfast., Disp: 90 tablet, Rfl: 1 .  Glucose Blood DISK, BAYER BREEZE 2 TEST (In Vitro Disk)  1 Disk check fsbs daily for 90 days  Quantity: 10;  Refills: 1   Ordered :07-Apr-2012  [name], Rox Mcgriff MD;  Started 07-Apr-2012 Active, Disp: , Rfl:  .   levothyroxine (SYNTHROID, LEVOTHROID) 75 MCG tablet, TAKE ONE TABLET BY MOUTH ONCE DAILY BEFORE BREAKFAST, Disp: 90 tablet, Rfl: 0 .  losartan (COZAAR) 100 MG tablet, TAKE ONE TABLET BY MOUTH ONCE DAILY, Disp: 90 tablet, Rfl: 0 .  metFORMIN (GLUCOPHAGE) 500 MG tablet, TAKE ONE TABLET BY MOUTH THREE TIMES DAILY, Disp: 270 tablet, Rfl: 1 .  Multiple Vitamins-Minerals (MULTIVITAMIN WOMEN 50+) TABS, Take by mouth., Disp: , Rfl:   Allergies  Allergen Reactions  . Sulfa Antibiotics      ROS  Constitutional: Negative for fever or weight change.  Respiratory: Negative for cough and shortness of breath.   Cardiovascular: Negative for chest pain or palpitations.  Gastrointestinal: Negative for abdominal pain, no bowel changes.  Musculoskeletal: Negative for gait problem or joint swelling.  Skin: Negative for rash.  Neurological: Negative for dizziness or headache.  No other specific complaints in a complete review of systems (except as listed in HPI above).  Objective  Vitals:   05/13/16 0847  BP: 124/68  Pulse: 88  Resp: 18  Temp: 97.6 F (36.4 C)  TempSrc: Oral  SpO2: 96%  Weight: 152 lb 1.6 oz (69 kg)  Height: 5' (1.524 m)    Body mass index is 29.7 kg/m.  Physical Exam  Constitutional: Patient appears well-developed and well-nourished. Obese  No distress.  HEENT: head atraumatic, normocephalic, pupils equal and reactive to light, neck supple, throat within normal limits Cardiovascular: Normal rate, regular rhythm and normal heart sounds.  No murmur heard. No BLE edema. Pulmonary/Chest: Effort normal and breath sounds normal. No respiratory distress. Abdominal: Soft.  There is no tenderness. Psychiatric: Patient has a normal mood and affect. behavior is normal. Judgment and thought content normal.  Recent Results (from the past 2160 hour(s))  POCT HgB A1C     Status: Normal   Collection Time: 05/13/16  9:06 AM  Result Value Ref Range   Hemoglobin A1C 6.4       PHQ2/9: [facility] 2/9 05/13/2016 01/12/2016 09/10/2015 06/12/2015 02/12/2015  Decreased Interest 0 0 0 0 0  Down, Depressed, Hopeless 0 0 0 0 0  PHQ - 2 Score 0 0 0 0 0     Fall Risk: Fall Risk  05/13/2016 01/12/2016 09/10/2015 06/12/2015 02/12/2015  Falls in the past year? No Yes No Yes Yes  Number falls in past yr: - 1 - 1 1  Injury with Fall? - Yes - Yes Yes    Functional Status Survey: Is the patient deaf or have difficulty hearing?: No Does the patient have difficulty seeing, even when wearing glasses/contacts?: No Does the patient have difficulty concentrating, remembering, or making decisions?: No Does the patient have difficulty walking or climbing stairs?: No Does  the patient have difficulty dressing or bathing?: No Does the patient have difficulty doing errands alone such as visiting a doctor's office or shopping?: No    Assessment & Plan  1. Controlled type 2 diabetes mellitus with microalbuminuria, without long-term current use of insulin (HCC)  - POCT HgB A1C  2. Needs flu shot  - Flu vaccine HIGH DOSE PF (Fluzone High dose)  3. Essential hypertension  - amLODipine (NORVASC) 2.5 MG tablet; Take 1 tablet (2.5 mg total) by mouth daily.  Dispense: 90 tablet; Refill: 0 - COMPLETE METABOLIC PANEL WITH GFR  4. Dyslipidemia  - Lipid panel  5. Major depression in remission (HCC)  - citalopram (CELEXA) 40 MG tablet; Take 1 tablet (40 mg total) by mouth daily.  Dispense: 90 tablet; Refill: 1 - clonazePAM (KLONOPIN) 0.5 MG tablet; Take 1 tablet (0.5 mg total) by mouth 2 (two) times daily as needed.  Dispense: 30 tablet; Refill: 0  6. Other specified hypothyroidism  - TSH

## 2016-06-16 ENCOUNTER — Other Ambulatory Visit: Payer: Self-pay | Admitting: Family Medicine

## 2016-06-16 NOTE — Telephone Encounter (Signed)
Patient requesting refill of Levothyroxine and Losartan to [facility].

## 2016-06-17 ENCOUNTER — Other Ambulatory Visit: Payer: Self-pay

## 2016-06-17 DIAGNOSIS — E039 Hypothyroidism, unspecified: Secondary | ICD-10-CM

## 2016-06-17 NOTE — Progress Notes (Unsigned)
[facility] manufacture of Levothyroxine due to not being able to receive at the present time the usually manufacture. Per Dr. Carlynn PurlSowles ok to change manufacturer but patient will need a updated blood test of her TSH 6 weeks after starting new one. Patient was informed and lab slip mailed to patient house.

## 2016-07-30 ENCOUNTER — Other Ambulatory Visit: Payer: Self-pay | Admitting: Family Medicine

## 2016-07-30 DIAGNOSIS — E039 Hypothyroidism, unspecified: Secondary | ICD-10-CM | POA: Diagnosis not present

## 2016-07-30 LAB — TSH: TSH: 2.25 mIU/L

## 2016-09-07 ENCOUNTER — Other Ambulatory Visit: Payer: Self-pay | Admitting: Family Medicine

## 2016-09-07 DIAGNOSIS — Z1231 Encounter for screening mammogram for malignant neoplasm of breast: Secondary | ICD-10-CM

## 2016-09-13 ENCOUNTER — Ambulatory Visit (INDEPENDENT_AMBULATORY_CARE_PROVIDER_SITE_OTHER): Payer: Medicare Other | Admitting: Family Medicine

## 2016-09-13 ENCOUNTER — Encounter: Payer: Self-pay | Admitting: Family Medicine

## 2016-09-13 VITALS — BP 126/74 | HR 90 | Temp 97.6°F | Resp 18 | Ht 60.0 in | Wt 149.7 lb

## 2016-09-13 DIAGNOSIS — K429 Umbilical hernia without obstruction or gangrene: Secondary | ICD-10-CM

## 2016-09-13 DIAGNOSIS — R809 Proteinuria, unspecified: Secondary | ICD-10-CM

## 2016-09-13 DIAGNOSIS — E038 Other specified hypothyroidism: Secondary | ICD-10-CM | POA: Diagnosis not present

## 2016-09-13 DIAGNOSIS — K30 Functional dyspepsia: Secondary | ICD-10-CM

## 2016-09-13 DIAGNOSIS — R748 Abnormal levels of other serum enzymes: Secondary | ICD-10-CM

## 2016-09-13 DIAGNOSIS — E785 Hyperlipidemia, unspecified: Secondary | ICD-10-CM | POA: Diagnosis not present

## 2016-09-13 DIAGNOSIS — N183 Chronic kidney disease, stage 3 unspecified: Secondary | ICD-10-CM

## 2016-09-13 DIAGNOSIS — E669 Obesity, unspecified: Secondary | ICD-10-CM

## 2016-09-13 DIAGNOSIS — E1129 Type 2 diabetes mellitus with other diabetic kidney complication: Secondary | ICD-10-CM | POA: Diagnosis not present

## 2016-09-13 DIAGNOSIS — I10 Essential (primary) hypertension: Secondary | ICD-10-CM

## 2016-09-13 DIAGNOSIS — F325 Major depressive disorder, single episode, in full remission: Secondary | ICD-10-CM | POA: Diagnosis not present

## 2016-09-13 DIAGNOSIS — R630 Anorexia: Secondary | ICD-10-CM

## 2016-09-13 LAB — CBC WITH DIFFERENTIAL/PLATELET
BASOS ABS: 0 {cells}/uL (ref 0–200)
Basophils Relative: 0 %
EOS ABS: 256 {cells}/uL (ref 15–500)
Eosinophils Relative: 4 %
HEMATOCRIT: 39.8 % (ref 35.0–45.0)
Hemoglobin: 12.5 g/dL (ref 11.7–15.5)
Lymphocytes Relative: 30 %
Lymphs Abs: 1920 cells/uL (ref 850–3900)
MCH: 29.1 pg (ref 27.0–33.0)
MCHC: 31.4 g/dL — ABNORMAL LOW (ref 32.0–36.0)
MCV: 92.6 fL (ref 80.0–100.0)
MONOS PCT: 11 %
MPV: 9.8 fL (ref 7.5–12.5)
Monocytes Absolute: 704 cells/uL (ref 200–950)
NEUTROS ABS: 3520 {cells}/uL (ref 1500–7800)
NEUTROS PCT: 55 %
PLATELETS: 301 10*3/uL (ref 140–400)
RBC: 4.3 MIL/uL (ref 3.80–5.10)
RDW: 14.7 % (ref 11.0–15.0)
WBC: 6.4 10*3/uL (ref 3.8–10.8)

## 2016-09-13 LAB — COMPLETE METABOLIC PANEL WITH GFR
ALT: 23 U/L (ref 6–29)
AST: 47 U/L — ABNORMAL HIGH (ref 10–35)
Albumin: 3.8 g/dL (ref 3.6–5.1)
Alkaline Phosphatase: 111 U/L (ref 33–130)
BUN: 16 mg/dL (ref 7–25)
CO2: 24 mmol/L (ref 20–31)
Calcium: 9.9 mg/dL (ref 8.6–10.4)
Chloride: 106 mmol/L (ref 98–110)
Creat: 1.29 mg/dL — ABNORMAL HIGH (ref 0.50–0.99)
GFR, Est African American: 49 mL/min — ABNORMAL LOW (ref >=60)
GFR, Est Non African American: 42 mL/min — ABNORMAL LOW (ref >=60)
Glucose, Bld: 120 mg/dL — ABNORMAL HIGH (ref 65–99)
Potassium: 4 mmol/L (ref 3.5–5.3)
Sodium: 139 mmol/L (ref 135–146)
Total Bilirubin: 0.6 mg/dL (ref 0.2–1.2)
Total Protein: 7.3 g/dL (ref 6.1–8.1)

## 2016-09-13 LAB — POCT GLYCOSYLATED HEMOGLOBIN (HGB A1C): Hemoglobin A1C: 6.6

## 2016-09-13 MED ORDER — AMLODIPINE BESYLATE 2.5 MG PO TABS: 2.5000 mg | ORAL_TABLET | Freq: Every day | ORAL | 1 refills | Status: DC

## 2016-09-13 MED ORDER — METFORMIN HCL 500 MG PO TABS: 500.0000 mg | ORAL_TABLET | Freq: Two times a day (BID) | ORAL | 1 refills | Status: DC

## 2016-09-13 MED ORDER — LEVOTHYROXINE SODIUM 75 MCG PO TABS: ORAL_TABLET | ORAL | 1 refills | Status: DC

## 2016-09-13 MED ORDER — ATORVASTATIN CALCIUM 80 MG PO TABS: 80.0000 mg | ORAL_TABLET | Freq: Every evening | ORAL | 1 refills | Status: DC

## 2016-09-13 MED ORDER — LOSARTAN POTASSIUM 100 MG PO TABS: 100.0000 mg | ORAL_TABLET | Freq: Every day | ORAL | 1 refills | Status: DC

## 2016-09-13 MED ORDER — GLIPIZIDE ER 5 MG PO TB24: 5.0000 mg | ORAL_TABLET | Freq: Every day | ORAL | 1 refills | Status: DC

## 2016-09-13 NOTE — Progress Notes (Signed)
Name: Megan Short   MRN: [id number]    DOB: 1945/06/14   Date:09/13/2016       Progress Note  Subjective  Chief Complaint  Chief Complaint  Patient presents with  . Medication Refill    4 month F/U  . Diabetes    Checks one to twice daily and averages around 100's  . Hyperlipidemia  . Hypertension  . Hypothyroidism  . Nausea    Onset-past week and has not had an appetite.     HPI  HTN: taking medication and denies side effects. No chest pain, no palpitation, or orthostatic changes. BP at home has been around 120's/80's  Hypothyroidism: denies fatigue, dry skin, dysphagia or constipation She is taking Levothyroxine as prescribed last TSH at goal  DMII: fsbs is at goal, low to mid 100's. She is taking Metformin twice daily, and Glipizide XL    She is exercise 4 days a week, last hgbA1C was 6.6% down to 6.4% back at 6.6%. She denies polyphagia, polydipsia or polyuria. She is taking ARB and last urine micro has improved down from 100 to 50. Sees Dr. Thedore Mins, her last GFR had improved, CKI stage II now.  CKI: Sees Nephrologist yearly - Dr. Thedore Mins, denies decrease in urinary volume, no itching, taking ARB  Major Depression: it was caused by death of her son 10 years ago, she is doing well on Celexa and BZD's very seldom. She denies anhedonia, or crying spells, still has difficulty making decisions ( like picking out an outfit) but not about finances , but improving. She has been exercising, 4-5 times a week, going out.   Dyslipidemia: on Atorvastatin, no side effects. HDL is low, discussed ways to improve her HDL  Indigestion: she had an URI  A few weeks ago, and last week after the cold symptoms resolved, she noticed indigestion, took laxatives to see if it would improve symptoms, but it did not seem to help. She has daily bowel movements, no blood in stools, no  vomiting, but has a lack of appetite, some nausea, bloating and burping. No heartburn or abdominal pain.   Patient  Active Problem List   Diagnosis Date Noted  . Obesity, Class I, BMI 30-34.9 11/12/2014  . Chronic kidney disease (CKD), stage III (moderate) 09/07/2014  . Diabetes mellitus with renal manifestations, controlled (HCC) 09/07/2014  . Adult hypothyroidism 09/07/2014  . Vitamin D deficiency 09/07/2014  . Benign essential HTN 09/07/2014  . Microalbuminuria 09/07/2014  . Dyslipidemia 09/07/2014  . Major depression in full remission (HCC) 09/07/2014  . 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/07/2014  . Calculus of kidney 09/07/2014  . Memory loss or impairment 09/07/2014    Past Surgical History:  Procedure Laterality Date  . BREAST BIOPSY Right    negative times 2  . COLONOSCOPY WITH PROPOFOL N/A 01/20/2015   Procedure: COLONOSCOPY WITH PROPOFOL;  Surgeon: Scot Jun, MD;  Location: [facility] ENDOSCOPY;  Service: Endoscopy;  Laterality: N/A;  . LITHOTRIPSY Left 12/2013    Family History  Problem Relation Age of Onset  . Osteoarthritis Mother   . Osteoarthritis Sister   . Alzheimer's disease Maternal Aunt     Social History   Social History  . Marital status: Married    Spouse name: N/A  . Number of children: N/A  . Years of education: N/A   Occupational History  . Not on file.   Social History Main Topics  . Smoking status: Never Smoker  . Smokeless tobacco: Never Used  .  Alcohol use No  . Drug use: No  . Sexual activity: No   Other Topics Concern  . Not on file   Social History Narrative  . No narrative on file     Current Outpatient Prescriptions:  .  amLODipine (NORVASC) 2.5 MG tablet, Take 1 tablet (2.5 mg total) by mouth daily., Disp: 90 tablet, Rfl: 0 .  aspirin 81 MG tablet, Take by mouth., Disp: , Rfl:  .  atorvastatin (LIPITOR) 80 MG tablet, TAKE ONE TABLET BY MOUTH IN THE EVENING, Disp: 90 tablet, Rfl: 1 .  citalopram (CELEXA) 40 MG tablet, Take 1 tablet (40 mg total) by mouth daily., Disp: 90 tablet, Rfl: 1 .  clonazePAM (KLONOPIN) 0.5 MG  tablet, Take 1 tablet (0.5 mg total) by mouth 2 (two) times daily as needed., Disp: 30 tablet, Rfl: 0 .  glipiZIDE (GLUCOTROL XL) 5 MG 24 hr tablet, Take 1 tablet (5 mg total) by mouth daily with breakfast., Disp: 90 tablet, Rfl: 1 .  Glucose Blood DISK, BAYER BREEZE 2 TEST (In Vitro Disk)  1 Disk check fsbs daily for 90 days  Quantity: 10;  Refills: 1   Ordered :07-Apr-2012  Alba Cory MD;  Started 07-Apr-2012 Active, Disp: , Rfl:  .  levothyroxine (SYNTHROID, LEVOTHROID) 75 MCG tablet, TAKE ONE TABLET BY MOUTH ONCE DAILY BEFORE BREAKFAST, Disp: 90 tablet, Rfl: 0 .  losartan (COZAAR) 100 MG tablet, TAKE ONE TABLET BY MOUTH ONCE DAILY, Disp: 90 tablet, Rfl: 0 .  metFORMIN (GLUCOPHAGE) 500 MG tablet, TAKE ONE TABLET BY MOUTH THREE TIMES DAILY, Disp: 270 tablet, Rfl: 1 .  Multiple Vitamins-Minerals (MULTIVITAMIN WOMEN 50+) TABS, Take by mouth., Disp: , Rfl:   Allergies  Allergen Reactions  . Sulfa Antibiotics      ROS  Constitutional: Negative for fever or significant weight change.  Respiratory: Negative for cough and shortness of breath.   Cardiovascular: Negative for chest pain or palpitations.  Gastrointestinal: Negative for abdominal pain, no bowel changes, but has noticed indigestion and lack of appetite.  Musculoskeletal: Negative for gait problem or joint swelling.  Skin: Negative for rash.  Neurological: Negative for dizziness or headache.  No other specific complaints in a complete review of systems (except as listed in HPI above).  Objective  Vitals:   09/13/16 0846  BP: 126/74  Pulse: 90  Resp: 18  Temp: 97.6 F (36.4 C)  TempSrc: Oral  SpO2: 97%  Weight: 149 lb 11.2 oz (67.9 kg)  Height: 5' (1.524 m)    Body mass index is 29.24 kg/m.  Physical Exam  Constitutional: Patient appears well-developed and well-nourished. Obese  No distress.  HEENT: head atraumatic, normocephalic, pupils equal and reactive to light, neck supple, throat within normal  limits Cardiovascular: Normal rate, regular rhythm and normal heart sounds.  No murmur heard. No BLE edema. Pulmonary/Chest: Effort normal and breath sounds normal. No respiratory distress. Abdominal: Soft.  There is no tenderness. Small umbilical hernia Psychiatric: Patient has a normal mood and affect. behavior is normal. Judgment and thought content normal.  Recent Results (from the past 2160 hour(s))  TSH     Status: None   Collection Time: 07/30/16  1:44 PM  Result Value Ref Range   TSH 2.25 mIU/L    Comment:   Reference Range   > or = 20 Years  0.40-4.50   Pregnancy Range First trimester  0.26-2.66 Second trimester 0.55-2.73 Third trimester  0.43-2.91     POCT HgB A1C     Status: None  Collection Time: 09/13/16  8:54 AM  Result Value Ref Range   Hemoglobin A1C 6.6       PHQ2/9: Depression screen [facility] 2/9 09/13/2016 05/13/2016 01/12/2016 09/10/2015 06/12/2015  Decreased Interest 0 0 0 0 0  Down, Depressed, Hopeless 0 0 0 0 0  PHQ - 2 Score 0 0 0 0 0     Fall Risk: Fall Risk  09/13/2016 05/13/2016 01/12/2016 09/10/2015 06/12/2015  Falls in the past year? No No Yes No Yes  Number falls in past yr: - - 1 - 1  Injury with Fall? - - Yes - Yes     Functional Status Survey: Is the patient deaf or have difficulty hearing?: No Does the patient have difficulty seeing, even when wearing glasses/contacts?: No Does the patient have difficulty concentrating, remembering, or making decisions?: No Does the patient have difficulty walking or climbing stairs?: No Does the patient have difficulty dressing or bathing?: No Does the patient have difficulty doing errands alone such as visiting a doctor's office or shopping?: No    Assessment & Plan   1. Controlled type 2 diabetes mellitus with microalbuminuria, without long-term current use of insulin (HCC)  - POCT HgB A1C - glipiZIDE (GLUCOTROL XL) 5 MG 24 hr tablet; Take 1 tablet (5 mg total) by mouth daily with breakfast.  Dispense: 90  tablet; Refill: 1 - metFORMIN (GLUCOPHAGE) 500 MG tablet; Take 1 tablet (500 mg total) by mouth 2 (two) times daily with a meal.  Dispense: 180 tablet; Refill: 1 - losartan (COZAAR) 100 MG tablet; Take 1 tablet (100 mg total) by mouth daily.  Dispense: 90 tablet; Refill: 1  2. Essential hypertension  - COMPLETE METABOLIC PANEL WITH GFR - amLODipine (NORVASC) 2.5 MG tablet; Take 1 tablet (2.5 mg total) by mouth daily.  Dispense: 90 tablet; Refill: 1 - losartan (COZAAR) 100 MG tablet; Take 1 tablet (100 mg total) by mouth daily.  Dispense: 90 tablet; Refill: 1  3. Dyslipidemia  - atorvastatin (LIPITOR) 80 MG tablet; Take 1 tablet (80 mg total) by mouth every evening.  Dispense: 90 tablet; Refill: 1 To  improve HDL patient  needs to eat tree nuts ( pecans/pistachios/almonds ) four times weekly, eat fish two times weekly  and exercise  at least 150 minutes per week  4. Major depression in remission (HCC)  Controlled   5. Other specified hypothyroidism  - levothyroxine (SYNTHROID, LEVOTHROID) 75 MCG tablet; TAKE ONE TABLET BY MOUTH ONCE DAILY BEFORE BREAKFAST  Dispense: 90 tablet; Refill: 1  6. Chronic kidney disease (CKD), stage II (moderate)  Doing better  7. Microalbuminuria  Continue ARB  8. Obesity, Class I, BMI 30-34.9  Lost a few pounds since last visit, but she is not feeling as hungry, we will check labs  9. Elevated liver enzymes  - COMPLETE METABOLIC PANEL WITH GFR  10. Lack of appetite  - CBC with Differential/Platelet - Sedimentation rate - C-reactive protein - H. pylori breath test  11. Indigestion  - H. pylori breath test If normal labs we will try a course of PPI and follow up in a couple of weeks

## 2016-09-14 LAB — H. PYLORI BREATH TEST: H. pylori Breath Test: NOT DETECTED

## 2016-09-14 LAB — C-REACTIVE PROTEIN: CRP: 7.3 mg/L (ref <8.0)

## 2016-09-14 LAB — SEDIMENTATION RATE: Sed Rate: 14 mm/hr (ref 0–30)

## 2016-10-18 ENCOUNTER — Ambulatory Visit
Admission: RE | Admit: 2016-10-18 | Discharge: 2016-10-18 | Disposition: A | Discharge status: Home / Self Care | Payer: Medicare Other | Source: Ambulatory Visit | Attending: Family Medicine | Admitting: Family Medicine

## 2016-10-18 DIAGNOSIS — Z1231 Encounter for screening mammogram for malignant neoplasm of breast: Secondary | ICD-10-CM | POA: Diagnosis not present

## 2016-12-14 ENCOUNTER — Ambulatory Visit (INDEPENDENT_AMBULATORY_CARE_PROVIDER_SITE_OTHER): Payer: Medicare Other | Admitting: Family Medicine

## 2016-12-14 ENCOUNTER — Encounter: Payer: Self-pay | Admitting: Family Medicine

## 2016-12-14 VITALS — BP 118/68 | HR 80 | Temp 98.1°F | Resp 18 | Ht 60.0 in | Wt 150.6 lb

## 2016-12-14 DIAGNOSIS — N183 Chronic kidney disease, stage 3 unspecified: Secondary | ICD-10-CM

## 2016-12-14 DIAGNOSIS — E669 Obesity, unspecified: Secondary | ICD-10-CM | POA: Diagnosis not present

## 2016-12-14 DIAGNOSIS — E038 Other specified hypothyroidism: Secondary | ICD-10-CM | POA: Diagnosis not present

## 2016-12-14 DIAGNOSIS — F325 Major depressive disorder, single episode, in full remission: Secondary | ICD-10-CM

## 2016-12-14 DIAGNOSIS — R748 Abnormal levels of other serum enzymes: Secondary | ICD-10-CM | POA: Diagnosis not present

## 2016-12-14 DIAGNOSIS — K429 Umbilical hernia without obstruction or gangrene: Secondary | ICD-10-CM

## 2016-12-14 DIAGNOSIS — E785 Hyperlipidemia, unspecified: Secondary | ICD-10-CM

## 2016-12-14 DIAGNOSIS — I10 Essential (primary) hypertension: Secondary | ICD-10-CM | POA: Diagnosis not present

## 2016-12-14 DIAGNOSIS — E66811 Obesity, class 1: Secondary | ICD-10-CM

## 2016-12-14 DIAGNOSIS — E1129 Type 2 diabetes mellitus with other diabetic kidney complication: Secondary | ICD-10-CM

## 2016-12-14 DIAGNOSIS — R809 Proteinuria, unspecified: Secondary | ICD-10-CM

## 2016-12-14 LAB — POCT GLYCOSYLATED HEMOGLOBIN (HGB A1C): HEMOGLOBIN A1C: 5.9

## 2016-12-14 MED ORDER — CITALOPRAM HYDROBROMIDE 40 MG PO TABS: 40.0000 mg | ORAL_TABLET | Freq: Every day | ORAL | 1 refills | Status: DC

## 2016-12-14 MED ORDER — GLIPIZIDE ER 5 MG PO TB24: 5.0000 mg | ORAL_TABLET | Freq: Every day | ORAL | 1 refills | Status: DC

## 2016-12-14 MED ORDER — LEVOTHYROXINE SODIUM 75 MCG PO TABS: ORAL_TABLET | ORAL | 1 refills | Status: DC

## 2016-12-14 MED ORDER — LOSARTAN POTASSIUM 100 MG PO TABS: 100.0000 mg | ORAL_TABLET | Freq: Every day | ORAL | 1 refills | Status: DC

## 2016-12-14 MED ORDER — ATORVASTATIN CALCIUM 80 MG PO TABS
80.0000 mg | ORAL_TABLET | Freq: Every evening | ORAL | 1 refills | Status: DC
Start: 2016-12-14 — End: 2017-03-23

## 2016-12-14 NOTE — Progress Notes (Signed)
Name: Megan Short   MRN: [id number]    DOB: August 01, 1945   Date:12/14/2016       Progress Note  Subjective  Chief Complaint  Chief Complaint  Patient presents with  . Diabetes    3 month follow up checks daily  . Hypertension  . Depression    no issues  . Hypothyroidism  . Chronic Kidney Disease    HPI  HTN: taking medication daily  and denies side effects. No chest pain, no palpitation, or orthostatic changes. BP at home has been around 120's/80's.   Hypothyroidism: denies fatigue,dry skin, dysphagia or constipation She is taking Levothyroxine as prescribed last TSH at goal  DMII: fsbs is at goal, low to mid 100's. She is taking Metformin twice daily, and Glipizide XL  5mg  She is exercise 4 days a week, last hgbA1C was 6.6% down to 6.4% back at 6.6% down to 5.9%.  She denies polyphagia, polydipsia or polyuria. She is taking ARB and last urine micro has improved down from 100 to 50, time to recheck it. Seeing from Nephrologist - Dr. Thedore MinsSingh Glucose at home down to 79, up to 180 after she ate a lot of sweets We will try decreasing Glipizide to only prn in am's depending on fasting glucose and what is planned for the day. Carol Ada.   CKI: Sees Nephrologist yearly - Dr. Thedore MinsSingh, denies decrease in urinary volume, no itching, taking ARB  Major Depression: it was caused by death of her son 10 years ago, she is doing well on Celexa and BZD's very seldom. She denies anhedonia, or crying spells, still has difficulty making decisions ( like picking out an outfit) but not about finances , but improving. She has been exercising, 4-5 times a week, going out. She feels good, but wants to continue medication, Monday will be her son's 10 th anniversary of his death - he died from complications of lupus at age 71 yo  Dyslipidemia: on Atorvastatin, no side effects. HDL is low, discussed ways to improve her HDL  Indigestion: resolved  Umbilical hernia: she had some periumbilical pain last week, tender to  touch, no change in bowel movements, pain resolved, explained likely mild incarceration, should see surgeon, but she wants to hold off.   Patient Active Problem List   Diagnosis Date Noted  . Umbilical hernia without obstruction and without gangrene 09/13/2016  . Obesity, Class I, BMI 30-34.9 11/12/2014  . Chronic kidney disease (CKD), stage III (moderate) 09/07/2014  . Diabetes mellitus with renal manifestations, controlled (HCC) 09/07/2014  . Adult hypothyroidism 09/07/2014  . Vitamin D deficiency 09/07/2014  . Benign essential HTN 09/07/2014  . Microalbuminuria 09/07/2014  . Dyslipidemia 09/07/2014  . Major depression in full remission (HCC) 09/07/2014  . 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/07/2014  . Calculus of kidney 09/07/2014  . Memory loss or impairment 09/07/2014    Past Surgical History:  Procedure Laterality Date  . BREAST BIOPSY Right    negative times 2  . COLONOSCOPY WITH PROPOFOL N/A 01/20/2015   Procedure: COLONOSCOPY WITH PROPOFOL;  Surgeon: Scot Junobert T Elliott, MD;  Location: [facility] ENDOSCOPY;  Service: Endoscopy;  Laterality: N/A;  . LITHOTRIPSY Left 12/2013    Family History  Problem Relation Age of Onset  . Osteoarthritis Mother   . Osteoarthritis Sister   . Alzheimer's disease Maternal Aunt     Social History   Social History  . Marital status: Married    Spouse name: N/A  . Number of children: N/A  .  Years of education: N/A   Occupational History  . Not on file.   Social History Main Topics  . Smoking status: Never Smoker  . Smokeless tobacco: Never Used  . Alcohol use No  . Drug use: No  . Sexual activity: No   Other Topics Concern  . Not on file   Social History Narrative  . No narrative on file     Current Outpatient Prescriptions:  .  amLODipine (NORVASC) 2.5 MG tablet, Take 1 tablet (2.5 mg total) by mouth daily., Disp: 90 tablet, Rfl: 1 .  aspirin 81 MG tablet, Take by mouth., Disp: , Rfl:  .  atorvastatin  (LIPITOR) 80 MG tablet, Take 1 tablet (80 mg total) by mouth every evening., Disp: 90 tablet, Rfl: 1 .  citalopram (CELEXA) 40 MG tablet, Take 1 tablet (40 mg total) by mouth daily., Disp: 90 tablet, Rfl: 1 .  clonazePAM (KLONOPIN) 0.5 MG tablet, Take 1 tablet (0.5 mg total) by mouth 2 (two) times daily as needed., Disp: 30 tablet, Rfl: 0 .  glipiZIDE (GLUCOTROL XL) 5 MG 24 hr tablet, Take 1 tablet (5 mg total) by mouth daily with breakfast., Disp: 90 tablet, Rfl: 1 .  Glucose Blood DISK, BAYER BREEZE 2 TEST (In Vitro Disk)  1 Disk check fsbs daily for 90 days  Quantity: 10;  Refills: 1   Ordered :07-Apr-2012  Alba Cory MD;  Started 07-Apr-2012 Active, Disp: , Rfl:  .  levothyroxine (SYNTHROID, LEVOTHROID) 75 MCG tablet, TAKE ONE TABLET BY MOUTH ONCE DAILY BEFORE BREAKFAST, Disp: 90 tablet, Rfl: 1 .  losartan (COZAAR) 100 MG tablet, Take 1 tablet (100 mg total) by mouth daily., Disp: 90 tablet, Rfl: 1 .  metFORMIN (GLUCOPHAGE) 500 MG tablet, Take 1 tablet (500 mg total) by mouth 2 (two) times daily with a meal., Disp: 180 tablet, Rfl: 1 .  Multiple Vitamins-Minerals (MULTIVITAMIN WOMEN 50+) TABS, Take by mouth., Disp: , Rfl:   Allergies  Allergen Reactions  . Sulfa Antibiotics      ROS  Constitutional: Negative for fever or weight change.  Respiratory: Negative for cough and shortness of breath.   Cardiovascular: Negative for chest pain or palpitations.  Gastrointestinal: Negative for abdominal pain, no bowel changes.  Musculoskeletal: Negative for gait problem or joint swelling.  Skin: Negative for rash.  Neurological: Negative for dizziness or headache.  No other specific complaints in a complete review of systems (except as listed in HPI above).  Objective  Vitals:   12/14/16 1023  BP: 118/68  Pulse: 80  Resp: 18  Temp: 98.1 F (36.7 C)  SpO2: 97%  Weight: 150 lb 9 oz (68.3 kg)  Height: 5' (1.524 m)    Body mass index is 29.4 kg/m.  Physical Exam  Constitutional:  Patient appears well-developed and well-nourished. Obese No distress.  HEENT: head atraumatic, normocephalic, pupils equal and reactive to light,neck supple, throat within normal limits Cardiovascular: Normal rate, regular rhythm and normal heart sounds.  No murmur heard. No BLE edema. Pulmonary/Chest: Effort normal and breath sounds normal. No respiratory distress. Abdominal: Soft.  There is no tenderness. Umbilical hernia, reducible - non -tender no redness, about 1 cm in size Psychiatric: Patient has a normal mood and affect. behavior is normal. Judgment and thought content normal.  Recent Results (from the past 2160 hour(s))  POCT HgB A1C     Status: Normal   Collection Time: 12/14/16 10:27 AM  Result Value Ref Range   Hemoglobin A1C 5.9  PHQ2/9: Depression screen [facility] 2/9 09/13/2016 05/13/2016 01/12/2016 09/10/2015 06/12/2015  Decreased Interest 0 0 0 0 0  Down, Depressed, Hopeless 0 0 0 0 0  PHQ - 2 Score 0 0 0 0 0     Fall Risk: Fall Risk  09/13/2016 05/13/2016 01/12/2016 09/10/2015 06/12/2015  Falls in the past year? No No Yes No Yes  Number falls in past yr: - - 1 - 1  Injury with Fall? - - Yes - Yes      Assessment & Plan  1. Controlled type 2 diabetes mellitus with microalbuminuria, without long-term current use of insulin (HCC)  - POCT HgB A1C is down, she will try taking glipizide prn, weekends/cookouts - glipiZIDE (GLUCOTROL XL) 5 MG 24 hr tablet; Take 1 tablet (5 mg total) by mouth daily with breakfast.  Dispense: 90 tablet; Refill: 1 - losartan (COZAAR) 100 MG tablet; Take 1 tablet (100 mg total) by mouth daily.  Dispense: 90 tablet; Refill: 1 - Urine Microalbumin w/creat. ratio  2. Essential hypertension  - losartan (COZAAR) 100 MG tablet; Take 1 tablet (100 mg total) by mouth daily.  Dispense: 90 tablet; Refill: 1 - COMPLETE METABOLIC PANEL WITH GFR  3. Dyslipidemia  - atorvastatin (LIPITOR) 80 MG tablet; Take 1 tablet (80 mg total) by mouth every evening.   Dispense: 90 tablet; Refill: 1  4. Major depression in remission (HCC)  - citalopram (CELEXA) 40 MG tablet; Take 1 tablet (40 mg total) by mouth daily.  Dispense: 90 tablet; Refill: 1  5. Obesity, Class I, BMI 30-34.9  stable  6. Other specified hypothyroidism  Last TSH was at goal  - levothyroxine (SYNTHROID, LEVOTHROID) 75 MCG tablet; TAKE ONE TABLET BY MOUTH ONCE DAILY BEFORE BREAKFAST  Dispense: 90 tablet; Refill: 1  7. Elevated liver enzymes  We will recheck it  8. Umbilical hernia without obstruction and without gangrene  She does not want to go to surgeon right now, will call back if symptoms returns  9. Stage 3 chronic kidney disease  - Urine Microalbumin w/creat. ratio

## 2016-12-15 ENCOUNTER — Other Ambulatory Visit: Payer: Self-pay | Admitting: Family Medicine

## 2016-12-15 LAB — COMPLETE METABOLIC PANEL WITH GFR
ALT: 31 U/L — ABNORMAL HIGH (ref 6–29)
AST: 56 U/L — ABNORMAL HIGH (ref 10–35)
Albumin: 4.2 g/dL (ref 3.6–5.1)
Alkaline Phosphatase: 125 U/L (ref 33–130)
BILIRUBIN TOTAL: 0.7 mg/dL (ref 0.2–1.2)
BUN: 17 mg/dL (ref 7–25)
CO2: 21 mmol/L (ref 20–31)
Calcium: 10.5 mg/dL — ABNORMAL HIGH (ref 8.6–10.4)
Chloride: 104 mmol/L (ref 98–110)
Creat: 1.16 mg/dL — ABNORMAL HIGH (ref 0.50–0.99)
GFR, EST AFRICAN AMERICAN: 56 mL/min — AB (ref >=60)
GFR, EST NON AFRICAN AMERICAN: 48 mL/min — AB (ref >=60)
Glucose, Bld: 75 mg/dL (ref 65–99)
Potassium: 4.6 mmol/L (ref 3.5–5.3)
Sodium: 139 mmol/L (ref 135–146)
TOTAL PROTEIN: 7.7 g/dL (ref 6.1–8.1)

## 2016-12-15 LAB — MICROALBUMIN / CREATININE URINE RATIO
Creatinine, Urine: 198 mg/dL (ref 20–320)
MICROALB UR: 2.8 mg/dL
MICROALB/CREAT RATIO: 14 ug/mg{creat} (ref <30)

## 2016-12-16 ENCOUNTER — Other Ambulatory Visit: Payer: Self-pay

## 2016-12-23 ENCOUNTER — Other Ambulatory Visit: Payer: Self-pay | Admitting: Family Medicine

## 2016-12-23 LAB — PARATHYROID HORMONE, INTACT (NO CA): PTH: 52 pg/mL (ref 14–64)

## 2017-01-15 ENCOUNTER — Other Ambulatory Visit: Payer: Self-pay | Admitting: Family Medicine

## 2017-01-15 DIAGNOSIS — F325 Major depressive disorder, single episode, in full remission: Secondary | ICD-10-CM

## 2017-03-17 ENCOUNTER — Ambulatory Visit: Payer: Medicare Other | Admitting: Family Medicine

## 2017-03-23 ENCOUNTER — Encounter: Payer: Self-pay | Admitting: Family Medicine

## 2017-03-23 ENCOUNTER — Ambulatory Visit (INDEPENDENT_AMBULATORY_CARE_PROVIDER_SITE_OTHER): Payer: Medicare Other | Admitting: Family Medicine

## 2017-03-23 VITALS — BP 100/60 | HR 80 | Resp 12 | Ht 60.0 in | Wt 149.9 lb

## 2017-03-23 DIAGNOSIS — F325 Major depressive disorder, single episode, in full remission: Secondary | ICD-10-CM | POA: Diagnosis not present

## 2017-03-23 DIAGNOSIS — E1129 Type 2 diabetes mellitus with other diabetic kidney complication: Secondary | ICD-10-CM | POA: Diagnosis not present

## 2017-03-23 DIAGNOSIS — E785 Hyperlipidemia, unspecified: Secondary | ICD-10-CM | POA: Diagnosis not present

## 2017-03-23 DIAGNOSIS — I10 Essential (primary) hypertension: Secondary | ICD-10-CM | POA: Diagnosis not present

## 2017-03-23 DIAGNOSIS — E038 Other specified hypothyroidism: Secondary | ICD-10-CM | POA: Diagnosis not present

## 2017-03-23 DIAGNOSIS — E349 Endocrine disorder, unspecified: Secondary | ICD-10-CM

## 2017-03-23 DIAGNOSIS — N183 Chronic kidney disease, stage 3 unspecified: Secondary | ICD-10-CM

## 2017-03-23 DIAGNOSIS — R809 Proteinuria, unspecified: Secondary | ICD-10-CM | POA: Diagnosis not present

## 2017-03-23 DIAGNOSIS — Z23 Encounter for immunization: Secondary | ICD-10-CM | POA: Diagnosis not present

## 2017-03-23 LAB — POCT GLYCOSYLATED HEMOGLOBIN (HGB A1C): HEMOGLOBIN A1C: 6.4

## 2017-03-23 MED ORDER — METFORMIN HCL 500 MG PO TABS: 500.0000 mg | ORAL_TABLET | Freq: Two times a day (BID) | ORAL | 1 refills | Status: DC

## 2017-03-23 MED ORDER — AMLODIPINE BESYLATE 2.5 MG PO TABS: 2.5000 mg | ORAL_TABLET | Freq: Every day | ORAL | 1 refills | Status: DC

## 2017-03-23 MED ORDER — ATORVASTATIN CALCIUM 80 MG PO TABS: 80.0000 mg | ORAL_TABLET | Freq: Every evening | ORAL | 1 refills | Status: DC

## 2017-03-23 MED ORDER — LOSARTAN POTASSIUM 100 MG PO TABS: 100.0000 mg | ORAL_TABLET | Freq: Every day | ORAL | 1 refills | Status: DC

## 2017-03-23 MED ORDER — GLIPIZIDE ER 5 MG PO TB24: 5.0000 mg | ORAL_TABLET | Freq: Every day | ORAL | 1 refills | Status: DC

## 2017-03-23 MED ORDER — CITALOPRAM HYDROBROMIDE 40 MG PO TABS: 40.0000 mg | ORAL_TABLET | Freq: Every day | ORAL | 1 refills | Status: DC

## 2017-03-23 MED ORDER — LEVOTHYROXINE SODIUM 75 MCG PO TABS: ORAL_TABLET | ORAL | 1 refills | Status: DC

## 2017-03-23 NOTE — Progress Notes (Signed)
Name: Megan Short   MRN: [id number]    DOB: 1945/11/09   Date:03/23/2017       Progress Note  Subjective  Chief Complaint  Chief Complaint  Patient presents with  . Diabetes    HPI  HTN: taking medication daily  and denies side effects. No chest pain, no palpitation, or orthostatic changes. BP at home has been around 120's/80's. Occasionally goes up to 140, bp is low today we will recheck at home and also bring monitor to our office in one week, try taking half pill of Amlodipine until follow up next week  Hypothyroidism: denies fatigue,dry skin, dysphagiaor constipation She is taking Levothyroxine as prescribed last TSH at goal  DMII: fsbs is at goal, low to mid 100's. She is taking Metformin twice daily, and Glipizide XL 5mg  She is exercise 4 days a week, last hgbA1C was 6.6% down to 6.4% back at 6.6% down to 5.9%, and today is up to 6.4%, she never went down on Glipizide dose.  She denies polyphagia, polydipsia or polyuria. She is taking ARB and last urine micro has improved down from 100 to 50, time to recheck it. Seen by  Nephrologist - Dr. Thedore Mins - in the past   CKI: She has not seen Dr. Thedore Mins lately, but last GFR had improved, denies decrease in urinary volume, no itching, taking ARB  Major Depression: it was caused by death of her son 10years ago, she is doing well on Celexa and BZD's very seldom. She denies anhedonia, or crying spells, still has difficulty making decisions ( like picking out an outfit) but not about finances, but improving. She has been exercising, 4-5 times a week, going out. She feels good, but wants to continue medication, Monday will be her son's 10 th anniversary of his death - he died from complications of lupus at age 35 yo  Dyslipidemia: on Atorvastatin, no side effects. HDL is low, discussed ways to improve her HDL   Patient Active Problem List   Diagnosis Date Noted  . Hypercalcemia 12/23/2016  . Umbilical hernia without obstruction and  without gangrene 09/13/2016  . Obesity, Class I, BMI 30-34.9 11/12/2014  . Chronic kidney disease (CKD), stage III (moderate) (HCC) 09/07/2014  . Diabetes mellitus with renal manifestations, controlled (HCC) 09/07/2014  . Adult hypothyroidism 09/07/2014  . Vitamin D deficiency 09/07/2014  . Benign essential HTN 09/07/2014  . Microalbuminuria 09/07/2014  . Dyslipidemia 09/07/2014  . Major depression in full remission (HCC) 09/07/2014  . 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/07/2014  . Calculus of kidney 09/07/2014  . Memory loss or impairment 09/07/2014    Past Surgical History:  Procedure Laterality Date  . BREAST BIOPSY Right    negative times 2  . COLONOSCOPY WITH PROPOFOL N/A 01/20/2015   Procedure: COLONOSCOPY WITH PROPOFOL;  Surgeon: Scot Jun, MD;  Location: [facility] ENDOSCOPY;  Service: Endoscopy;  Laterality: N/A;  . LITHOTRIPSY Left 12/2013    Family History  Problem Relation Age of Onset  . Osteoarthritis Mother   . Osteoarthritis Sister   . Alzheimer's disease Maternal Aunt     Social History   Social History  . Marital status: Married    Spouse name: N/A  . Number of children: N/A  . Years of education: N/A   Occupational History  . Not on file.   Social History Main Topics  . Smoking status: Never Smoker  . Smokeless tobacco: Never Used  . Alcohol use No  . Drug use: No  .  Sexual activity: No   Other Topics Concern  . Not on file   Social History Narrative  . No narrative on file     Current Outpatient Prescriptions:  .  amLODipine (NORVASC) 2.5 MG tablet, Take 1 tablet (2.5 mg total) by mouth daily. Try to take half and monitor bp, Disp: 90 tablet, Rfl: 1 .  aspirin 81 MG tablet, Take by mouth., Disp: , Rfl:  .  atorvastatin (LIPITOR) 80 MG tablet, Take 1 tablet (80 mg total) by mouth every evening., Disp: 90 tablet, Rfl: 1 .  citalopram (CELEXA) 40 MG tablet, Take 1 tablet (40 mg total) by mouth daily., Disp: 90 tablet, Rfl:  1 .  clonazePAM (KLONOPIN) 0.5 MG tablet, TAKE ONE TABLET BY MOUTH TWICE DAILY AS NEEDED, Disp: 30 tablet, Rfl: 0 .  glipiZIDE (GLUCOTROL XL) 5 MG 24 hr tablet, Take 1 tablet (5 mg total) by mouth daily with breakfast., Disp: 90 tablet, Rfl: 1 .  Glucose Blood DISK, BAYER BREEZE 2 TEST (In Vitro Disk)  1 Disk check fsbs daily for 90 days  Quantity: 10;  Refills: 1   Ordered :07-Apr-2012  Alba Cory MD;  Started 07-Apr-2012 Active, Disp: , Rfl:  .  levothyroxine (SYNTHROID, LEVOTHROID) 75 MCG tablet, TAKE ONE TABLET BY MOUTH ONCE DAILY BEFORE BREAKFAST, Disp: 90 tablet, Rfl: 1 .  losartan (COZAAR) 100 MG tablet, Take 1 tablet (100 mg total) by mouth daily., Disp: 90 tablet, Rfl: 1 .  metFORMIN (GLUCOPHAGE) 500 MG tablet, Take 1 tablet (500 mg total) by mouth 2 (two) times daily with a meal., Disp: 180 tablet, Rfl: 1 .  Multiple Vitamins-Minerals (MULTIVITAMIN WOMEN 50+) TABS, Take by mouth., Disp: , Rfl:   Allergies  Allergen Reactions  . Sulfa Antibiotics      ROS  Constitutional: Negative for fever or weight change.  Respiratory: Negative for cough and shortness of breath.   Cardiovascular: Negative for chest pain or palpitations.  Gastrointestinal: Negative for abdominal pain, no bowel changes.  Musculoskeletal: Negative for gait problem or joint swelling.  Skin: Negative for rash.  Neurological: Negative for dizziness or headache.  No other specific complaints in a complete review of systems (except as listed in HPI above).  Objective  Vitals:   03/23/17 1108  BP: 100/60  Pulse: 80  Resp: 12  SpO2: 98%  Weight: 149 lb 14.4 oz (68 kg)  Height: 5' (1.524 m)    Body mass index is 29.28 kg/m.  Physical Exam   Constitutional: Patient appears well-developed and well-nourished. Obese  No distress.  HEENT: head atraumatic, normocephalic, pupils equal and reactive to light,  neck supple, throat within normal limits Cardiovascular: Normal rate, regular rhythm and normal  heart sounds.  No murmur heard. No BLE edema. Pulmonary/Chest: Effort normal and breath sounds normal. No respiratory distress. Abdominal: Soft.  There is no tenderness. Psychiatric: Patient has a normal mood and affect. behavior is normal. Judgment and thought content normal.  Diabetic Foot Exam: Diabetic Foot Exam - Simple   Simple Foot Form Diabetic Foot exam was performed with the following findings:  Yes 03/23/2017 11:25 AM  Visual Inspection See comments:  Yes Sensation Testing Intact to touch and monofilament testing bilaterally:  Yes Pulse Check Posterior Tibialis and Dorsalis pulse intact bilaterally:  Yes Comments Thick and deformed toe nails      PHQ2/9: Depression screen [facility] 2/9 09/13/2016 05/13/2016 01/12/2016 09/10/2015 06/12/2015  Decreased Interest 0 0 0 0 0  Down, Depressed, Hopeless 0 0 0 0 0  PHQ - 2 Score 0 0 0 0 0     Fall Risk: Fall Risk  03/23/2017 09/13/2016 05/13/2016 01/12/2016 09/10/2015  Falls in the past year? No No No Yes No  Number falls in past yr: - - - 1 -  Injury with Fall? - - - Yes -  Comment - - - Hurt her right knee -     Assessment & Plan  1. Controlled type 2 diabetes mellitus with microalbuminuria, without long-term current use of insulin (HCC)  - glipiZIDE (GLUCOTROL XL) 5 MG 24 hr tablet; Take 1 tablet (5 mg total) by mouth daily with breakfast.  Dispense: 90 tablet; Refill: 1 - losartan (COZAAR) 100 MG tablet; Take 1 tablet (100 mg total) by mouth daily.  Dispense: 90 tablet; Refill: 1 - metFORMIN (GLUCOPHAGE) 500 MG tablet; Take 1 tablet (500 mg total) by mouth 2 (two) times daily with a meal.  Dispense: 180 tablet; Refill: 1 - POCT HgB A1C  2. Essential hypertension  - amLODipine (NORVASC) 2.5 MG tablet; Take 1 tablet (2.5 mg total) by mouth daily. Try to take half and monitor bp  Dispense: 90 tablet; Refill: 1 - losartan (COZAAR) 100 MG tablet; Take 1 tablet (100 mg total) by mouth daily.  Dispense: 90 tablet; Refill: 1  3.  Dyslipidemia  - atorvastatin (LIPITOR) 80 MG tablet; Take 1 tablet (80 mg total) by mouth every evening.  Dispense: 90 tablet; Refill: 1  4. Major depression in remission (HCC)  - citalopram (CELEXA) 40 MG tablet; Take 1 tablet (40 mg total) by mouth daily.  Dispense: 90 tablet; Refill: 1  5. Other specified hypothyroidism  - levothyroxine (SYNTHROID, LEVOTHROID) 75 MCG tablet; TAKE ONE TABLET BY MOUTH ONCE DAILY BEFORE BREAKFAST  Dispense: 90 tablet; Refill: 1  6. Chronic kidney disease (CKD), stage III (moderate) (HCC)   7. Elevated parathyroid hormone  And calcium, has follow up with Dr. Tedd SiasSolum  - pth and calcium she stopped multivitamins   8. Needs flu shot  - Flu vaccine HIGH DOSE PF

## 2017-03-23 NOTE — Patient Instructions (Signed)
Take half pill of  amilodipine for the next week, monitor bp at home and return in one week for bp check ( nurse visit) to see if we can stop amilodipine  Please bring machine to check it during her next visit

## 2017-03-24 LAB — PTH, INTACT AND CALCIUM
CALCIUM: 10.2 mg/dL (ref 8.6–10.4)
PTH: 37 pg/mL (ref 14–64)

## 2017-03-30 ENCOUNTER — Ambulatory Visit: Payer: Medicare Other

## 2017-03-30 VITALS — BP 138/79 | HR 79

## 2017-03-30 DIAGNOSIS — I10 Essential (primary) hypertension: Secondary | ICD-10-CM

## 2017-03-30 NOTE — Progress Notes (Signed)
Mrs. Megan Short was here to day to check blood pressure. She has been monitoring her bp at home and it has been in the range of 127/76-147/48. Her blood pressure was checked my me manually and it was 110/64. She checked her blood pressure with her automated home blood pressure machine and it was 138/79. She was instructed by Dr. Carlynn PurlSowles to continue taking her blood pressure pill as instructed at previous visit.

## 2017-04-19 ENCOUNTER — Telehealth: Payer: Self-pay | Admitting: Family Medicine

## 2017-04-19 NOTE — Telephone Encounter (Signed)
There was a recall on Valsartan ( Diovan) not on Losartan as far as I know.

## 2017-04-19 NOTE — Telephone Encounter (Signed)
Copied from CRM [phone]. Topic: Inquiry >> Apr 19, 2017 11:37 AM Alexander BergeronBarksdale, Harvey B wrote: Reason for CRM: PT saw a commercial of losartin which stated that this medication could cause cancer and is wanting to know if she can get off this medicine or if she wil be okay

## 2017-04-19 NOTE — Telephone Encounter (Signed)
Message has been routed to PCP.

## 2017-04-20 NOTE — Telephone Encounter (Signed)
I just found the information, only for [employer] manufacture , she can find out with her pharmacy if she needs to stop taking, since that was just one plant in [place] from one [employer] manufacture.

## 2017-04-20 NOTE — Telephone Encounter (Signed)
Called patient in regards to the Losartan. She continues to have concerns. She states that she has seen this warning about Losartan on TV and the Internet. Please re advise.

## 2017-04-22 ENCOUNTER — Encounter: Payer: Self-pay | Admitting: Family Medicine

## 2017-04-22 DIAGNOSIS — E119 Type 2 diabetes mellitus without complications: Secondary | ICD-10-CM | POA: Diagnosis not present

## 2017-04-22 LAB — HM DIABETES EYE EXAM

## 2017-07-19 ENCOUNTER — Telehealth: Payer: Self-pay

## 2017-07-19 ENCOUNTER — Ambulatory Visit (INDEPENDENT_AMBULATORY_CARE_PROVIDER_SITE_OTHER): Payer: Medicare Other | Admitting: Family Medicine

## 2017-07-19 ENCOUNTER — Encounter: Payer: Self-pay | Admitting: Family Medicine

## 2017-07-19 VITALS — BP 120/70 | HR 84 | Resp 14 | Ht 60.0 in | Wt 149.2 lb

## 2017-07-19 DIAGNOSIS — E039 Hypothyroidism, unspecified: Secondary | ICD-10-CM

## 2017-07-19 DIAGNOSIS — E1129 Type 2 diabetes mellitus with other diabetic kidney complication: Secondary | ICD-10-CM

## 2017-07-19 DIAGNOSIS — E559 Vitamin D deficiency, unspecified: Secondary | ICD-10-CM

## 2017-07-19 DIAGNOSIS — I10 Essential (primary) hypertension: Secondary | ICD-10-CM | POA: Diagnosis not present

## 2017-07-19 DIAGNOSIS — R809 Proteinuria, unspecified: Secondary | ICD-10-CM | POA: Diagnosis not present

## 2017-07-19 DIAGNOSIS — E785 Hyperlipidemia, unspecified: Secondary | ICD-10-CM | POA: Diagnosis not present

## 2017-07-19 DIAGNOSIS — E038 Other specified hypothyroidism: Secondary | ICD-10-CM | POA: Diagnosis not present

## 2017-07-19 DIAGNOSIS — F325 Major depressive disorder, single episode, in full remission: Secondary | ICD-10-CM | POA: Diagnosis not present

## 2017-07-19 DIAGNOSIS — R21 Rash and other nonspecific skin eruption: Secondary | ICD-10-CM

## 2017-07-19 LAB — POCT GLYCOSYLATED HEMOGLOBIN (HGB A1C): Hemoglobin A1C: 5.9

## 2017-07-19 MED ORDER — CITALOPRAM HYDROBROMIDE 40 MG PO TABS: 40.0000 mg | ORAL_TABLET | Freq: Every day | ORAL | 1 refills | Status: DC

## 2017-07-19 MED ORDER — METFORMIN HCL ER 750 MG PO TB24: 1500.0000 mg | ORAL_TABLET | Freq: Every evening | ORAL | 1 refills | Status: DC

## 2017-07-19 MED ORDER — ATORVASTATIN CALCIUM 80 MG PO TABS: 80.0000 mg | ORAL_TABLET | Freq: Every evening | ORAL | 1 refills | Status: DC

## 2017-07-19 MED ORDER — CLONAZEPAM 0.5 MG PO TABS: 0.5000 mg | ORAL_TABLET | Freq: Two times a day (BID) | ORAL | 0 refills | Status: DC | PRN

## 2017-07-19 MED ORDER — LOSARTAN POTASSIUM 100 MG PO TABS: 100.0000 mg | ORAL_TABLET | Freq: Every day | ORAL | 1 refills | Status: DC

## 2017-07-19 MED ORDER — LEVOTHYROXINE SODIUM 75 MCG PO TABS: ORAL_TABLET | ORAL | 1 refills | Status: DC

## 2017-07-19 NOTE — Patient Instructions (Signed)
Stop metformin 500 mg and start Metformin 750 mg two at night Stop glipizide  Continue other medication

## 2017-07-19 NOTE — Telephone Encounter (Signed)
[facility] states they are unable to get her current manufacturer of medication of Levothyroxine and wanted to know if we could change it. Dr. Carlynn PurlSowles prefers not too and will send to another pharmacy. [facility] [place]

## 2017-07-19 NOTE — Progress Notes (Signed)
Name: Megan Short   MRN: [id number]    DOB: [birth year]   Date:07/19/2017       Progress Note  Subjective  Chief Complaint  Chief Complaint  Patient presents with  . Diabetes  . Hypertension  . Hyperlipidemia  . Hypothyroidism    HPI   HTN: taking medication daily and denies side effects. No chest pain, no palpitation, or orthostatic changes. BP not being checked at home lately, we weaned her off Norvasc and bp today was great.   Hypothyroidism: denies fatigue,dry skin, dysphagiaor constipation She is taking Levothyroxine as prescribed last TSH at goal  DMII: fsbs is at goal, low to mid 100's. She is taking Metformin twice daily, and Glipizide XL 5mg  She is exercise 4 days a week, last hgbA1C was 6.6% down to 6.4% back at 6.6% down to 5.9%, and today is up to 6.4%, today down to 5.9% we will stop Glipizide and change to metformin ER 1500 mg daily. She denies polyphagia, polydipsia or polyuria. She is taking ARB and last urine micro has improved down from 100 to 50, and down to 14 back in July 2018 Seen by  Nephrologist - Dr. Thedore Mins - in the past . Doing well at this time, continue ARB  Major Depression: it was caused by death of her son 10years ago ( complications of lupus died at age 14) , she is doing well on Celexa and BZD's very seldom. She denies anhedonia, or crying spells, still has difficulty making decisions ( like picking out an outfit) but not about finances, but improving. She has been exercising, 4-5 times a week.  She feels good, but wants to continue medication.  Dyslipidemia: on Atorvastatin, no side effects. HDL is low, discussed ways to improve her HDL  Rash: she has hyperpigmentation on both legs, seems to have excoriation states not itchy  Patient Active Problem List   Diagnosis Date Noted  . Hypercalcemia 12/23/2016  . Umbilical hernia without obstruction and without gangrene 09/13/2016  . Obesity, Class I, BMI 30-34.9 11/12/2014  . Chronic kidney  disease (CKD), stage III (moderate) (HCC) 09/07/2014  . Diabetes mellitus with renal manifestations, controlled (HCC) 09/07/2014  . Adult hypothyroidism 09/07/2014  . Vitamin D deficiency 09/07/2014  . Benign essential HTN 09/07/2014  . Microalbuminuria 09/07/2014  . Dyslipidemia 09/07/2014  . Major depression in full remission (HCC) 09/07/2014  . 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/07/2014  . Calculus of kidney 09/07/2014  . Memory loss or impairment 09/07/2014    Past Surgical History:  Procedure Laterality Date  . BREAST BIOPSY Right    negative times 2  . COLONOSCOPY WITH PROPOFOL N/A 01/20/2015   Procedure: COLONOSCOPY WITH PROPOFOL;  Surgeon: Scot Jun, MD;  Location: [facility] ENDOSCOPY;  Service: Endoscopy;  Laterality: N/A;  . LITHOTRIPSY Left 12/2013    Family History  Problem Relation Age of Onset  . Osteoarthritis Mother   . Osteoarthritis Sister   . Alzheimer's disease Maternal Aunt     Social History   Socioeconomic History  . Marital status: Married    Spouse name: Not on file  . Number of children: Not on file  . Years of education: Not on file  . Highest education level: Not on file  Social Needs  . Financial resource strain: Not on file  . Food insecurity - worry: Not on file  . Food insecurity - inability: Not on file  . Transportation needs - medical: Not on file  . Transportation needs -  non-medical: Not on file  Occupational History  . Not on file  Tobacco Use  . Smoking status: Never Smoker  . Smokeless tobacco: Never Used  Substance and Sexual Activity  . Alcohol use: No    Alcohol/week: 0.0 oz  . Drug use: No  . Sexual activity: No  Other Topics Concern  . Not on file  Social History Narrative  . Not on file     Current Outpatient Medications:  .  aspirin 81 MG tablet, Take by mouth., Disp: , Rfl:  .  atorvastatin (LIPITOR) 80 MG tablet, Take 1 tablet (80 mg total) by mouth every evening., Disp: 90 tablet, Rfl:  1 .  citalopram (CELEXA) 40 MG tablet, Take 1 tablet (40 mg total) by mouth daily., Disp: 90 tablet, Rfl: 1 .  clonazePAM (KLONOPIN) 0.5 MG tablet, TAKE ONE TABLET BY MOUTH TWICE DAILY AS NEEDED, Disp: 30 tablet, Rfl: 0 .  Glucose Blood DISK, BAYER BREEZE 2 TEST (In Vitro Disk)  1 Disk check fsbs daily for 90 days  Quantity: 10;  Refills: 1   Ordered :07-Apr-2012  [name], Finn Altemose MD;  Started 07-Apr-2012 Active, Disp: , Rfl:  .  levothyroxine (SYNTHROID, LEVOTHROID) 75 MCG tablet, TAKE ONE TABLET BY MOUTH ONCE DAILY BEFORE BREAKFAST, Disp: 90 tablet, Rfl: 1 .  losartan (COZAAR) 100 MG tablet, Take 1 tablet (100 mg total) by mouth daily., Disp: 90 tablet, Rfl: 1 .  Multiple Vitamins-Minerals (MULTIVITAMIN WOMEN 50+) TABS, Take by mouth., Disp: , Rfl:   Allergies  Allergen Reactions  . Sulfa Antibiotics      ROS  Constitutional: Negative for fever or weight change.  Respiratory: Negative for cough and shortness of breath.   Cardiovascular: Negative for chest pain or palpitations.  Gastrointestinal: Negative for abdominal pain, no bowel changes. Bloating  Musculoskeletal: Negative for gait problem or joint swelling.  Skin: Negative for rash.  Neurological: Negative for dizziness or headache.  No other specific complaints in a complete review of systems (except as listed in HPI above).  Objective  Vitals:   07/19/17 0910  BP: 120/70  Pulse: 84  Resp: 14  SpO2: 96%  Weight: 149 lb 3.2 oz (67.7 kg)  Height: 5' (1.524 m)    Body mass index is 29.14 kg/m.  Physical Exam  Constitutional: Patient appears well-developed and well-nourished. Overweight No distress.  HEENT: head atraumatic, normocephalic, pupils equal and reactive to light,  neck supple, throat within normal limits Cardiovascular: Normal rate, regular rhythm and normal heart sounds.  No murmur heard. No BLE edema. Pulmonary/Chest: Effort normal and breath sounds normal. No respiratory distress. Abdominal: Soft.  There  is no tenderness. Psychiatric: Patient has a normal mood and affect. behavior is normal. Judgment and thought content normal. Skin: hyperpigmentation, looks like excoriation but she states not scratching her skin  Recent Results (from the past 2160 hour(s))  HM DIABETES EYE EXAM     Status: None   Collection Time: 04/22/17 12:00 AM  Result Value Ref Range   HM Diabetic Eye Exam No Retinopathy No Retinopathy    Comment: Dr. Inez PilgrimBrasington, [facility]  POCT HgB A1C     Status: Abnormal   Collection Time: 07/19/17  9:12 AM  Result Value Ref Range   Hemoglobin A1C 5.9       PHQ2/9: Depression screen [facility] 2/9 09/13/2016 05/13/2016 01/12/2016 09/10/2015 06/12/2015  Decreased Interest 0 0 0 0 0  Down, Depressed, Hopeless 0 0 0 0 0  PHQ - 2 Score 0 0  0 0 0     Fall Risk: Fall Risk  07/19/2017 03/23/2017 09/13/2016 05/13/2016 01/12/2016  Falls in the past year? No No No No Yes  Number falls in past yr: - - - - 1  Injury with Fall? - - - - Yes  Comment - - - - Hurt her right knee    Functional Status Survey: Is the patient deaf or have difficulty hearing?: No Does the patient have difficulty seeing, even when wearing glasses/contacts?: No Does the patient have difficulty concentrating, remembering, or making decisions?: No Does the patient have difficulty walking or climbing stairs?: No Does the patient have difficulty dressing or bathing?: No Does the patient have difficulty doing errands alone such as visiting a doctor's office or shopping?: No   Assessment & Plan  1. Controlled type 2 diabetes mellitus with microalbuminuria, without long-term current use of insulin (HCC)  - POCT HgB A1C 5.9% , we will stop Glipizide and increase dose of Metformin from 1000 mg to 1500 mg ER - losartan (COZAAR) 100 MG tablet; Take 1 tablet (100 mg total) by mouth daily.  Dispense: 90 tablet; Refill: 1 - metFORMIN (GLUCOPHAGE-XR) 750 MG 24 hr tablet; Take 2 tablets (1,500 mg total) by mouth every evening.   Dispense: 180 tablet; Refill: 1  2. Essential hypertension  - losartan (COZAAR) 100 MG tablet; Take 1 tablet (100 mg total) by mouth daily.  Dispense: 90 tablet; Refill: 1 -comp panel   3. Other specified hypothyroidism  - levothyroxine (SYNTHROID, LEVOTHROID) 75 MCG tablet; TAKE ONE TABLET BY MOUTH ONCE DAILY BEFORE BREAKFAST  Dispense: 90 tablet; Refill: 1 - TSH  4. Major depression in remission (HCC)  - clonazePAM (KLONOPIN) 0.5 MG tablet; Take 1 tablet (0.5 mg total) by mouth 2 (two) times daily as needed.  Dispense: 30 tablet; Refill: 0 - citalopram (CELEXA) 40 MG tablet; Take 1 tablet (40 mg total) by mouth daily.  Dispense: 90 tablet; Refill: 1  5. Dyslipidemia  - atorvastatin (LIPITOR) 80 MG tablet; Take 1 tablet (80 mg total) by mouth every evening.  Dispense: 90 tablet; Refill: 1 - lipid panel   6. Rash  On legs, seen by dermatologist but going for a second opinion at [facility] in [place], has an appointment scheduled for March 8th, 2019   7. Vitamin D deficiency  - VITAMIN D 25 Hydroxy (Vit-D Deficiency, Fractures)

## 2017-07-20 LAB — COMPREHENSIVE METABOLIC PANEL
AG Ratio: 1.2 (calc) (ref 1.0–2.5)
ALBUMIN MSPROF: 4.1 g/dL (ref 3.6–5.1)
ALT: 21 U/L (ref 6–29)
AST: 44 U/L — ABNORMAL HIGH (ref 10–35)
Alkaline phosphatase (APISO): 116 U/L (ref 33–130)
BUN/Creatinine Ratio: 13 (calc) (ref 6–22)
BUN: 14 mg/dL (ref 7–25)
CALCIUM: 10.2 mg/dL (ref 8.6–10.4)
CO2: 24 mmol/L (ref 20–32)
CREATININE: 1.07 mg/dL — AB (ref 0.60–0.93)
Chloride: 105 mmol/L (ref 98–110)
GLUCOSE: 90 mg/dL (ref 65–139)
Globulin: 3.4 g/dL (calc) (ref 1.9–3.7)
POTASSIUM: 4.2 mmol/L (ref 3.5–5.3)
SODIUM: 138 mmol/L (ref 135–146)
TOTAL PROTEIN: 7.5 g/dL (ref 6.1–8.1)
Total Bilirubin: 0.7 mg/dL (ref 0.2–1.2)

## 2017-07-20 LAB — LIPID PANEL
CHOL/HDL RATIO: 3.5 (calc) (ref <5.0)
Cholesterol: 113 mg/dL (ref <200)
HDL: 32 mg/dL — ABNORMAL LOW (ref >50)
LDL CHOLESTEROL (CALC): 65 mg/dL
Non-HDL Cholesterol (Calc): 81 mg/dL (calc) (ref <130)
TRIGLYCERIDES: 82 mg/dL (ref <150)

## 2017-07-20 LAB — TSH: TSH: 9.64 m[IU]/L — AB (ref 0.40–4.50)

## 2017-07-20 LAB — VITAMIN D 25 HYDROXY (VIT D DEFICIENCY, FRACTURES): VIT D 25 HYDROXY: 27 ng/mL — AB (ref 30–100)

## 2017-07-22 ENCOUNTER — Other Ambulatory Visit: Payer: Self-pay | Admitting: Family Medicine

## 2017-07-22 ENCOUNTER — Encounter: Payer: Self-pay | Admitting: Family Medicine

## 2017-07-22 ENCOUNTER — Other Ambulatory Visit: Payer: Self-pay

## 2017-07-22 DIAGNOSIS — E038 Other specified hypothyroidism: Secondary | ICD-10-CM

## 2017-07-22 MED ORDER — LEVOTHYROXINE SODIUM 88 MCG PO TABS: ORAL_TABLET | ORAL | 1 refills | Status: DC

## 2017-07-22 NOTE — Telephone Encounter (Signed)
Refill request for thyroid medication: Levothyroxine  Last Physical:   Lab Results  Component Value Date   TSH 9.64 (H) 07/19/2017    No Follow-up on file.

## 2017-07-22 NOTE — Telephone Encounter (Signed)
[facility] because of her TSH level, needs to stop by in 6 weeks for repeat TSH. Please order and notify patient when ready

## 2017-07-22 NOTE — Telephone Encounter (Signed)
Copied from CRM [phone]. Topic: Quick Communication - See Telephone Encounter >> Jul 22, 2017  9:50 AM Joana ReamerWarren, Amber N wrote: CRM for notification. See Telephone encounter for: 07/22/17.  Patient still waiting for rx for levothyroxine (SYNTHROID, LEVOTHROID) 75 MCG tablet [facility] [place]. Please advise.

## 2017-07-23 ENCOUNTER — Encounter: Payer: Self-pay | Admitting: Family Medicine

## 2017-07-25 ENCOUNTER — Other Ambulatory Visit: Payer: Self-pay | Admitting: Family Medicine

## 2017-07-25 DIAGNOSIS — E039 Hypothyroidism, unspecified: Secondary | ICD-10-CM

## 2017-07-26 NOTE — Telephone Encounter (Addendum)
Please sign TSH order. I tried to call patient to let her know that TSH needs to be rechecked in 6 weeks. I was unable to reach her via phone, so I sent her a MyChart message.

## 2017-07-26 NOTE — Addendum Note (Signed)
Addended by: Tommie RaymondBOOKER, Aliviya Schoeller L on: 07/26/2017 01:13 PM   Modules accepted: Orders

## 2017-07-26 NOTE — Addendum Note (Signed)
Addended by: [name], Leyton Magoon F on: 07/26/2017 04:23 PM   Modules accepted: Orders

## 2017-07-29 ENCOUNTER — Ambulatory Visit: Payer: Self-pay

## 2017-07-29 NOTE — Telephone Encounter (Signed)
Try to take with breakfast and only once a day to see if it works.

## 2017-07-29 NOTE — Telephone Encounter (Signed)
  Reason for Disposition . Health Information question, no triage required and triager able to answer question    Will route back to office in case med needs adjusting.  Answer Assessment - Initial Assessment Questions 1. REASON FOR CALL or QUESTION: "What is your reason for calling today?" or "How can I best help you?" or "What question do you have that I can help answer?"     Pt calling to report that Metformin is causing nausea and diarrhea. Pt is on Metformin 750 mg XR 1 twice a day, Pt has been eating snack sized candy bars and eating sandwiches also. Advised pt to watch her carbohydrate intack and to drink plenty of water.  Pt asking advice from Dr Carlynn PurlSowles.  Protocols used: INFORMATION ONLY CALL-A-AH

## 2017-08-01 NOTE — Telephone Encounter (Signed)
Patient called.  Patient aware.  

## 2017-08-06 ENCOUNTER — Encounter: Payer: Self-pay | Admitting: Family Medicine

## 2017-08-12 DIAGNOSIS — L810 Postinflammatory hyperpigmentation: Secondary | ICD-10-CM | POA: Diagnosis not present

## 2017-08-23 DIAGNOSIS — E039 Hypothyroidism, unspecified: Secondary | ICD-10-CM | POA: Diagnosis not present

## 2017-08-23 LAB — TSH: TSH: 1.6 mIU/L (ref 0.40–4.50)

## 2017-08-24 ENCOUNTER — Telehealth: Payer: Self-pay

## 2017-08-24 ENCOUNTER — Other Ambulatory Visit: Payer: Self-pay | Admitting: Family Medicine

## 2017-08-24 DIAGNOSIS — E039 Hypothyroidism, unspecified: Secondary | ICD-10-CM

## 2017-08-24 DIAGNOSIS — E038 Other specified hypothyroidism: Secondary | ICD-10-CM

## 2017-08-24 MED ORDER — LEVOTHYROXINE SODIUM 88 MCG PO TABS: ORAL_TABLET | ORAL | 1 refills | Status: DC

## 2017-08-24 MED ORDER — LEVOTHYROXINE SODIUM 88 MCG PO TABS: ORAL_TABLET | ORAL | 0 refills | Status: DC

## 2017-08-24 NOTE — Telephone Encounter (Signed)
I will sent levothyroxine 88 mcg to take one daily and half on Sunday, she will need to return in 6 weeks to recheck TSH level  Please enter order so we can print and have it ready for her

## 2017-08-24 NOTE — Telephone Encounter (Signed)
Patient called to clarify prescription dosage for her levothyroxine. States she was on 1675 mcq previous and Dr. Carlynn PurlSowles adjusted her dosage for 3 weeks to see what her TSH level would read. Her latest test showed her TSH was back to normal and continue current dosage. She wanted clarification on to keep taking 75 mcg or 88 mcg? Thanks

## 2017-08-25 NOTE — Telephone Encounter (Signed)
Patient notified and mailed lab slip with reminder to recheck blood work in 6 weeks around Oct 10, 2017.

## 2017-09-05 ENCOUNTER — Other Ambulatory Visit: Payer: Self-pay | Admitting: Family Medicine

## 2017-09-05 DIAGNOSIS — Z1231 Encounter for screening mammogram for malignant neoplasm of breast: Secondary | ICD-10-CM

## 2017-09-13 ENCOUNTER — Other Ambulatory Visit: Payer: Self-pay | Admitting: Family Medicine

## 2017-09-13 DIAGNOSIS — E038 Other specified hypothyroidism: Secondary | ICD-10-CM

## 2017-09-16 ENCOUNTER — Other Ambulatory Visit: Payer: Self-pay | Admitting: Family Medicine

## 2017-09-16 DIAGNOSIS — E038 Other specified hypothyroidism: Secondary | ICD-10-CM

## 2017-09-16 NOTE — Telephone Encounter (Signed)
Rx refill request

## 2017-09-16 NOTE — Telephone Encounter (Signed)
Copied from CRM [phone]. Topic: Quick Communication - Rx Refill/Question >> Sep 16, 2017  1:56 PM Laural BenesJohnson, Louisianahiquita C wrote: Medication: levothyroxine (SYNTHROID, LEVOTHROID) 88 MCG tablet  Has the patient contacted their pharmacy? Yes  (Agent: If no, request that the patient contact the pharmacy for the refill.)  Preferred Pharmacy (with phone number or street name): [id number] [place], [place] - [address] [phone] (Phone) [phone] (Fax)     Agent: Please be advised that RX refills may take up to 3 business days. We ask that you follow-up with your pharmacy.

## 2017-09-17 ENCOUNTER — Telehealth: Payer: Self-pay | Admitting: Family Medicine

## 2017-09-17 DIAGNOSIS — E038 Other specified hypothyroidism: Secondary | ICD-10-CM

## 2017-09-19 ENCOUNTER — Encounter: Payer: Self-pay | Admitting: Podiatry

## 2017-09-19 NOTE — Telephone Encounter (Signed)
Lab Results  Component Value Date   TSH 1.60 08/23/2017   Dr. Carlynn PurlSowles just approved Rx on 08/24/17 Please contact pharmacy Give Dx code if needed

## 2017-09-20 ENCOUNTER — Encounter: Payer: Self-pay | Admitting: Podiatry

## 2017-09-20 ENCOUNTER — Ambulatory Visit (INDEPENDENT_AMBULATORY_CARE_PROVIDER_SITE_OTHER): Payer: Medicare Other | Admitting: Podiatry

## 2017-09-20 DIAGNOSIS — B351 Tinea unguium: Secondary | ICD-10-CM

## 2017-09-20 DIAGNOSIS — M79609 Pain in unspecified limb: Secondary | ICD-10-CM | POA: Diagnosis not present

## 2017-09-20 DIAGNOSIS — E0843 Diabetes mellitus due to underlying condition with diabetic autonomic (poly)neuropathy: Secondary | ICD-10-CM

## 2017-09-20 NOTE — Telephone Encounter (Signed)
Rx just sent on 08/24/17 Too soon for refill to another pharmacy

## 2017-09-20 NOTE — Telephone Encounter (Signed)
Patient checking status, please advise. Call back [phone]

## 2017-09-20 NOTE — Telephone Encounter (Signed)
Left voicemail with pharmacy 

## 2017-09-21 ENCOUNTER — Other Ambulatory Visit: Payer: Self-pay

## 2017-09-21 DIAGNOSIS — E038 Other specified hypothyroidism: Secondary | ICD-10-CM

## 2017-09-21 MED ORDER — LEVOTHYROXINE SODIUM 88 MCG PO TABS: ORAL_TABLET | ORAL | 0 refills | Status: DC

## 2017-09-21 NOTE — Telephone Encounter (Signed)
Refill request for thyroid medication: Levothyroxine 88 mcg  Last Physical: 01/12/2016  Lab Results  Component Value Date   TSH 1.60 08/23/2017    Follow-ups on file. 11/17/2017

## 2017-09-27 NOTE — Progress Notes (Signed)
   SUBJECTIVE Patient with a history of diabetes mellitus presents to office today complaining of elongated, thickened nails that cause pain while ambulating in shoes. She is unable to trim her own nails. Patient is here for further evaluation and treatment.   Past Medical History:  Diagnosis Date  . Anxiety   . COPD (chronic obstructive pulmonary disease) (HCC)   . Depression   . Hyperlipidemia   . Hypertension   . Hypothyroidism     OBJECTIVE General Patient is awake, alert, and oriented x 3 and in no acute distress. Derm Skin is dry and supple bilateral. Negative open lesions or macerations. Remaining integument unremarkable. Nails are tender, long, thickened and dystrophic with subungual debris, consistent with onychomycosis, 1-5 bilateral. No signs of infection noted. Vasc  DP and PT pedal pulses palpable bilaterally. Temperature gradient within normal limits.  Neuro Epicritic and protective threshold sensation diminished bilaterally.  Musculoskeletal Exam No symptomatic pedal deformities noted bilateral. Muscular strength within normal limits.  ASSESSMENT 1. Diabetes Mellitus w/ peripheral neuropathy 2. Onychomycosis of nail due to dermatophyte bilateral 3. Pain in foot bilateral  PLAN OF CARE 1. Patient evaluated today. 2. Instructed to maintain good pedal hygiene and foot care. Stressed importance of controlling blood sugar.  3. Mechanical debridement of nails 1-5 bilaterally performed using a nail nipper. Filed with dremel without incident.  4. Return to clinic in 3 mos.     Felecia ShellingBrent M. Evans, DPM [facility]  Dr. Felecia ShellingBrent M. Evans, DPM    [address]                                        [place], [place] [address]                Office [phone]  Fax [phone]

## 2017-10-10 DIAGNOSIS — E038 Other specified hypothyroidism: Secondary | ICD-10-CM | POA: Diagnosis not present

## 2017-10-10 DIAGNOSIS — E039 Hypothyroidism, unspecified: Secondary | ICD-10-CM | POA: Diagnosis not present

## 2017-10-10 LAB — TSH: TSH: 0.57 m[IU]/L (ref 0.40–4.50)

## 2017-10-19 ENCOUNTER — Ambulatory Visit
Admission: RE | Admit: 2017-10-19 | Discharge: 2017-10-19 | Disposition: A | Discharge status: Home / Self Care | Payer: Medicare Other | Source: Ambulatory Visit | Attending: Family Medicine | Admitting: Family Medicine

## 2017-10-19 DIAGNOSIS — Z1231 Encounter for screening mammogram for malignant neoplasm of breast: Secondary | ICD-10-CM | POA: Insufficient documentation

## 2017-11-17 ENCOUNTER — Encounter: Payer: Self-pay | Admitting: Family Medicine

## 2017-11-17 ENCOUNTER — Ambulatory Visit (INDEPENDENT_AMBULATORY_CARE_PROVIDER_SITE_OTHER): Payer: Medicare Other | Admitting: Family Medicine

## 2017-11-17 VITALS — BP 140/80 | HR 70 | Temp 97.8°F | Resp 18 | Ht 60.0 in | Wt 144.6 lb

## 2017-11-17 DIAGNOSIS — F325 Major depressive disorder, single episode, in full remission: Secondary | ICD-10-CM | POA: Diagnosis not present

## 2017-11-17 DIAGNOSIS — R809 Proteinuria, unspecified: Secondary | ICD-10-CM

## 2017-11-17 DIAGNOSIS — E785 Hyperlipidemia, unspecified: Secondary | ICD-10-CM

## 2017-11-17 DIAGNOSIS — E1129 Type 2 diabetes mellitus with other diabetic kidney complication: Secondary | ICD-10-CM

## 2017-11-17 DIAGNOSIS — E038 Other specified hypothyroidism: Secondary | ICD-10-CM | POA: Diagnosis not present

## 2017-11-17 DIAGNOSIS — I10 Essential (primary) hypertension: Secondary | ICD-10-CM | POA: Diagnosis not present

## 2017-11-17 LAB — POCT GLYCOSYLATED HEMOGLOBIN (HGB A1C): HbA1c, POC (prediabetic range): 6.2 % (ref 5.7–6.4)

## 2017-11-17 MED ORDER — LEVOTHYROXINE SODIUM 88 MCG PO TABS: ORAL_TABLET | ORAL | 0 refills | Status: DC

## 2017-11-17 MED ORDER — CITALOPRAM HYDROBROMIDE 40 MG PO TABS: 40.0000 mg | ORAL_TABLET | Freq: Every day | ORAL | 1 refills | Status: DC

## 2017-11-17 MED ORDER — LOSARTAN POTASSIUM 100 MG PO TABS: 100.0000 mg | ORAL_TABLET | Freq: Every day | ORAL | 1 refills | Status: DC

## 2017-11-17 MED ORDER — METFORMIN HCL ER 750 MG PO TB24
750.0000 mg | ORAL_TABLET | Freq: Every evening | ORAL | 0 refills | Status: DC
Start: 2017-11-17 — End: 2018-03-21

## 2017-11-17 NOTE — Patient Instructions (Signed)
Please remember to fast at your next visit.

## 2017-11-17 NOTE — Progress Notes (Signed)
Name: Megan Short   MRN: [id number]    DOB: Sep 01, 1945   Date:11/17/2017       Progress Note  Subjective  Chief Complaint  Chief Complaint  Patient presents with  . Hypertension    Denies any symptoms  . Diabetes    Checks every am-Average-100's Lowest-89 Highest-187-takes Metformin once daily  . Depression  . Medication Refill    HPI  HTN: taking medication daily and denies side effects. No chest pain, no palpitation, or orthostatic changes. She has a bp monitor at home but has not checked it lately. Only on Losartan at this time. 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 She is taking Metformin once daily, and off Glipizide She is exercise 4 days a week, last hgbA1C was 6.6% down to 6.4%, 6.6% down to 5.9%, ,6.4%, today down to 5.9% and still below 6 % today. She denies polyphagia, polydipsia or polyuria. She is taking ARB and last urine micro has improved down from 100 to 50, and down to 14 back in July 2018 Seen byNephrologist - Dr. Thedore Mins - in the past. Doing well at this time, continue ARB, we will recheck urine micro with next labs  Major Depression: it was caused by death of her son 10 years ago ( complications of lupus died at age 14) , she is doing well on Celexa and BZD's very seldom. She denies anhedonia, or crying spells, still has difficulty making decisions ( like picking out an outfit) but not about finances, she has noticed some lack of patience towards her elderly mother - at times, but is doing okay otherwise.  She has been exercising, 4-5 times a week. In remission   Dyslipidemia: on Atorvastatin, no side effects. HDL is low, discussed ways to improve her HDL. Continue high dose statin therapy      Patient Active Problem List   Diagnosis Date Noted  . Umbilical hernia without obstruction and without gangrene 09/13/2016  . Diabetes mellitus with renal manifestations, controlled (HCC) 09/07/2014  . Adult hypothyroidism 09/07/2014  . Vitamin D deficiency 09/07/2014  . Benign essential HTN 09/07/2014  . Microalbuminuria 09/07/2014  . Dyslipidemia 09/07/2014  . Major depression in full remission (HCC) 09/07/2014  . 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/07/2014  . Calculus of kidney 09/07/2014  . Memory loss or impairment 09/07/2014    Past Surgical History:  Procedure Laterality Date  . BREAST BIOPSY Right    negative times 2  . COLONOSCOPY WITH PROPOFOL N/A 01/20/2015   Procedure: COLONOSCOPY WITH PROPOFOL;  Surgeon: Scot Jun, MD;  Location: [facility] ENDOSCOPY;  Service: Endoscopy;  Laterality: N/A;  . LITHOTRIPSY Left 12/2013    Family History  Problem Relation Age of Onset  . Osteoarthritis Mother   . Osteoarthritis Sister   . Alzheimer's disease Maternal Aunt     Social History   Socioeconomic History  . Marital status: Married    Spouse name: Not on file  . Number of children: Not on file  . Years of education: Not on file  . Highest education level: Not on file  Occupational History  . Not on file  Social Needs  . Financial resource strain: Not on file  . Food insecurity:  Worry: Not on file    Inability: Not on file  . Transportation needs:    Medical: Not on file    Non-medical: Not on file  Tobacco Use  . Smoking status: Never Smoker  . Smokeless tobacco: Never Used  Substance and Sexual Activity  . Alcohol use: No    Alcohol/week: 0.0 oz  . Drug use: No  . Sexual activity: Never  Lifestyle  . Physical activity:    Days per week: Not on file    Minutes per session: Not on file  . Stress: Not on file  Relationships  . Social connections:    Talks on phone: Not on file    Gets together: Not on file    Attends  religious service: Not on file    Active member of club or organization: Not on file    Attends meetings of clubs or organizations: Not on file    Relationship status: Not on file  . Intimate partner violence:    Fear of current or ex partner: Not on file    Emotionally abused: Not on file    Physically abused: Not on file    Forced sexual activity: Not on file  Other Topics Concern  . Not on file  Social History Narrative  . Not on file     Current Outpatient Medications:  .  atorvastatin (LIPITOR) 80 MG tablet, Take 1 tablet (80 mg total) by mouth every evening., Disp: 90 tablet, Rfl: 1 .  citalopram (CELEXA) 40 MG tablet, Take 1 tablet (40 mg total) by mouth daily., Disp: 90 tablet, Rfl: 1 .  clonazePAM (KLONOPIN) 0.5 MG tablet, Take 1 tablet (0.5 mg total) by mouth 2 (two) times daily as needed., Disp: 30 tablet, Rfl: 0 .  Glucose Blood DISK, BAYER BREEZE 2 TEST (In Vitro Disk)  1 Disk check fsbs daily for 90 days  Quantity: 10;  Refills: 1   Ordered :07-Apr-2012  Alba Cory MD;  Started 07-Apr-2012 Active, Disp: , Rfl:  .  levothyroxine (SYNTHROID, LEVOTHROID) 88 MCG tablet, Daily and half on Sundays, Disp: 90 tablet, Rfl: 0 .  losartan (COZAAR) 100 MG tablet, Take 1 tablet (100 mg total) by mouth daily., Disp: 90 tablet, Rfl: 1 .  metFORMIN (GLUCOPHAGE-XR) 750 MG 24 hr tablet, Take 1-2 tablets (750-1,500 mg total) by mouth every evening. Taking two when she splurges, Disp: 100 tablet, Rfl: 0 .  aspirin 81 MG tablet, Take by mouth., Disp: , Rfl:  .  hydroquinone 4 % cream, Apply topically., Disp: , Rfl:  .  Multiple Vitamins-Minerals (MULTIVITAMIN WOMEN 50+) TABS, Take by mouth., Disp: , Rfl:   Allergies  Allergen Reactions  . Sulfa Antibiotics      ROS  Constitutional: Negative for fever or weight change.  Respiratory: Negative for cough and shortness of breath.   Cardiovascular: Negative for chest pain or palpitations.  Gastrointestinal: Negative for abdominal pain,  no bowel changes.  Musculoskeletal: Negative for gait problem or joint swelling.  Skin: Negative for rash.  Neurological: Negative for dizziness or headache.  No other specific complaints in a complete review of systems (except as listed in HPI above).  Objective  Vitals:   11/17/17 0901  BP: 140/80  Pulse: 70  Resp: 18  Temp: 97.8 F (36.6 C)  TempSrc: Oral  SpO2: 98%  Weight: 144 lb 9.6 oz (65.6 kg)  Height: 5' (1.524 m)    Body mass index is 28.24 kg/m.  Physical Exam  Constitutional: Patient appears  well-developed and well-nourished. Overweight. No distress.  HEENT: head atraumatic, normocephalic, pupils equal and reactive to light, neck supple, throat within normal limits Cardiovascular: Normal rate, regular rhythm and normal heart sounds.  No murmur heard. No BLE edema. Pulmonary/Chest: Effort normal and breath sounds normal. No respiratory distress. Abdominal: Soft.  There is no tenderness. Psychiatric: Patient has a normal mood and affect. behavior is normal. Judgment and thought content normal.  Recent Results (from the past 2160 hour(s))  TSH     Status: None   Collection Time: 08/23/17 11:38 AM  Result Value Ref Range   TSH 1.60 0.40 - 4.50 mIU/L  TSH     Status: None   Collection Time: 10/10/17  9:33 AM  Result Value Ref Range   TSH 0.57 0.40 - 4.50 mIU/L  POCT HgB A1C     Status: Normal   Collection Time: 11/17/17  9:10 AM  Result Value Ref Range   Hemoglobin A1C  4.0 - 5.6 %   HbA1c, POC (prediabetic range) 6.2 5.7 - 6.4 %   HbA1c, POC (controlled diabetic range)  0.0 - 7.0 %      PHQ2/9: Depression screen [facility] 2/9 11/17/2017 11/17/2017 09/13/2016 05/13/2016 01/12/2016  Decreased Interest 0 0 0 0 0  Down, Depressed, Hopeless 0 0 0 0 0  PHQ - 2 Score 0 0 0 0 0  Altered sleeping 0 - - - -  Tired, decreased energy 0 - - - -  Change in appetite 0 - - - -  Feeling bad or failure about yourself  0 - - - -  Trouble concentrating 0 - - - -  Moving slowly or  fidgety/restless 0 - - - -  Suicidal thoughts 0 - - - -  PHQ-9 Score 0 - - - -  Difficult doing work/chores Not difficult at all - - - -     Fall Risk: Fall Risk  11/17/2017 07/19/2017 03/23/2017 09/13/2016 05/13/2016  Falls in the past year? No No No No No  Number falls in past yr: - - - - -  Injury with Fall? - - - - -  Comment - - - - -     Functional Status Survey: Is the patient deaf or have difficulty hearing?: No Does the patient have difficulty seeing, even when wearing glasses/contacts?: Yes Does the patient have difficulty concentrating, remembering, or making decisions?: No Does the patient have difficulty walking or climbing stairs?: No Does the patient have difficulty dressing or bathing?: No Does the patient have difficulty doing errands alone such as visiting a doctor's office or shopping?: No   Assessment & Plan    1. Controlled type 2 diabetes mellitus with microalbuminuria, without long-term current use of insulin (HCC)  - POCT HgB A1C - losartan (COZAAR) 100 MG tablet; Take 1 tablet (100 mg total) by mouth daily.  Dispense: 90 tablet; Refill: 1 - metFORMIN (GLUCOPHAGE-XR) 750 MG 24 hr tablet; Take 1-2 tablets (750-1,500 mg total) by mouth every evening. Taking two when she splurges  Dispense: 100 tablet; Refill: 0  2. Major depression in remission (HCC)  - citalopram (CELEXA) 40 MG tablet; Take 1 tablet (40 mg total) by mouth daily.  Dispense: 90 tablet; Refill: 1  3. Other specified hypothyroidism  - levothyroxine (SYNTHROID, LEVOTHROID) 88 MCG tablet; Daily and half on Sundays  Dispense: 90 tablet; Refill: 0  4. Essential hypertension  Controlled, continue medication, but also needs to resume aspirin since ASCVD is high at 18.3%   5.  Dyslipidemia  Continue statin therapy

## 2017-12-22 ENCOUNTER — Ambulatory Visit (INDEPENDENT_AMBULATORY_CARE_PROVIDER_SITE_OTHER): Payer: Medicare Other | Admitting: Podiatry

## 2017-12-22 ENCOUNTER — Encounter: Payer: Self-pay | Admitting: Podiatry

## 2017-12-22 DIAGNOSIS — M79676 Pain in unspecified toe(s): Secondary | ICD-10-CM

## 2017-12-22 DIAGNOSIS — M79609 Pain in unspecified limb: Principal | ICD-10-CM

## 2017-12-22 DIAGNOSIS — B351 Tinea unguium: Secondary | ICD-10-CM

## 2017-12-22 DIAGNOSIS — E0843 Diabetes mellitus due to underlying condition with diabetic autonomic (poly)neuropathy: Secondary | ICD-10-CM

## 2017-12-22 NOTE — Progress Notes (Signed)
Complaint:  Visit Type: Patient returns to my office for continued preventative foot care services. Complaint: Patient states" my nails have grown long and thick and become painful to walk and wear shoes" Patient has been diagnosed with DM with no foot complications. The patient presents for preventative foot care services. No changes to ROS  Podiatric Exam: Vascular: dorsalis pedis and posterior tibial pulses are palpable bilateral. Capillary return is immediate. Temperature gradient is WNL. Skin turgor WNL  Sensorium: Normal Semmes Weinstein monofilament test. Normal tactile sensation bilaterally. Nail Exam: Pt has thick disfigured discolored nails with subungual debris noted bilateral entire nail hallux through fifth toenails Ulcer Exam: There is no evidence of ulcer or pre-ulcerative changes or infection. Orthopedic Exam: Muscle tone and strength are WNL. No limitations in general ROM. No crepitus or effusions noted. Foot type and digits show no abnormalities. HAV  B/L. Skin: No Porokeratosis. No infection or ulcers.    Diagnosis:  Onychomycosis, , Pain in right toe, pain in left toes  Treatment & Plan Procedures and Treatment: Consent by patient was obtained for treatment procedures.   Debridement of mycotic and hypertrophic toenails, 1 through 5 bilateral and clearing of subungual debris. No ulceration, no infection noted.  Return Visit-Office Procedure: Patient instructed to return to the office for a follow up visit 3 months for continued evaluation and treatment.    Katena Petitjean DPM 

## 2017-12-27 ENCOUNTER — Ambulatory Visit: Payer: Medicare Other | Admitting: Podiatry

## 2018-03-09 ENCOUNTER — Encounter: Payer: Self-pay | Admitting: Podiatry

## 2018-03-09 ENCOUNTER — Ambulatory Visit (INDEPENDENT_AMBULATORY_CARE_PROVIDER_SITE_OTHER): Payer: Medicare Other | Admitting: Podiatry

## 2018-03-09 DIAGNOSIS — B351 Tinea unguium: Secondary | ICD-10-CM

## 2018-03-09 DIAGNOSIS — M79676 Pain in unspecified toe(s): Secondary | ICD-10-CM

## 2018-03-09 DIAGNOSIS — E0843 Diabetes mellitus due to underlying condition with diabetic autonomic (poly)neuropathy: Secondary | ICD-10-CM

## 2018-03-09 DIAGNOSIS — M79609 Pain in unspecified limb: Principal | ICD-10-CM

## 2018-03-09 NOTE — Progress Notes (Signed)
Complaint:  Visit Type: Patient returns to my office for continued preventative foot care services. Complaint: Patient states" my nails have grown long and thick and become painful to walk and wear shoes" Patient has been diagnosed with DM with no foot complications. The patient presents for preventative foot care services. No changes to ROS  Podiatric Exam: Vascular: dorsalis pedis and posterior tibial pulses are palpable bilateral. Capillary return is immediate. Temperature gradient is WNL. Skin turgor WNL  Sensorium: Normal Semmes Weinstein monofilament test. Normal tactile sensation bilaterally. Nail Exam: Pt has thick disfigured discolored nails with subungual debris noted bilateral entire nail hallux through fifth toenails Ulcer Exam: There is no evidence of ulcer or pre-ulcerative changes or infection. Orthopedic Exam: Muscle tone and strength are WNL. No limitations in general ROM. No crepitus or effusions noted. Foot type and digits show no abnormalities. HAV  B/L. Skin: No Porokeratosis. No infection or ulcers.    Diagnosis:  Onychomycosis, , Pain in right toe, pain in left toes  Treatment & Plan Procedures and Treatment: Consent by patient was obtained for treatment procedures.   Debridement of mycotic and hypertrophic toenails, 1 through 5 bilateral and clearing of subungual debris. No ulceration, no infection noted.  Return Visit-Office Procedure: Patient instructed to return to the office for a follow up visit 3 months for continued evaluation and treatment.    Zorian Gunderman DPM 

## 2018-03-21 ENCOUNTER — Ambulatory Visit: Payer: Medicare Other

## 2018-03-21 ENCOUNTER — Encounter: Payer: Self-pay | Admitting: Family Medicine

## 2018-03-21 ENCOUNTER — Ambulatory Visit (INDEPENDENT_AMBULATORY_CARE_PROVIDER_SITE_OTHER): Payer: Medicare Other | Admitting: Family Medicine

## 2018-03-21 VITALS — BP 116/64 | HR 86 | Temp 98.4°F | Ht 60.0 in | Wt 140.9 lb

## 2018-03-21 DIAGNOSIS — R809 Proteinuria, unspecified: Secondary | ICD-10-CM

## 2018-03-21 DIAGNOSIS — E559 Vitamin D deficiency, unspecified: Secondary | ICD-10-CM | POA: Diagnosis not present

## 2018-03-21 DIAGNOSIS — E785 Hyperlipidemia, unspecified: Secondary | ICD-10-CM | POA: Diagnosis not present

## 2018-03-21 DIAGNOSIS — F325 Major depressive disorder, single episode, in full remission: Secondary | ICD-10-CM | POA: Diagnosis not present

## 2018-03-21 DIAGNOSIS — I10 Essential (primary) hypertension: Secondary | ICD-10-CM | POA: Diagnosis not present

## 2018-03-21 DIAGNOSIS — Z23 Encounter for immunization: Secondary | ICD-10-CM | POA: Diagnosis not present

## 2018-03-21 DIAGNOSIS — E1129 Type 2 diabetes mellitus with other diabetic kidney complication: Secondary | ICD-10-CM

## 2018-03-21 DIAGNOSIS — E039 Hypothyroidism, unspecified: Secondary | ICD-10-CM

## 2018-03-21 LAB — POCT GLYCOSYLATED HEMOGLOBIN (HGB A1C): Hemoglobin A1C: 6.3 % — AB (ref 4.0–5.6)

## 2018-03-21 MED ORDER — LOSARTAN POTASSIUM 100 MG PO TABS: 100.0000 mg | ORAL_TABLET | Freq: Every day | ORAL | 1 refills | Status: DC

## 2018-03-21 MED ORDER — METFORMIN HCL ER 500 MG PO TB24: 1000.0000 mg | ORAL_TABLET | Freq: Every evening | ORAL | 1 refills | Status: DC

## 2018-03-21 MED ORDER — CLONAZEPAM 0.5 MG PO TABS: 0.5000 mg | ORAL_TABLET | Freq: Two times a day (BID) | ORAL | 0 refills | Status: DC | PRN

## 2018-03-21 MED ORDER — CITALOPRAM HYDROBROMIDE 40 MG PO TABS: 40.0000 mg | ORAL_TABLET | Freq: Every day | ORAL | 1 refills | Status: DC

## 2018-03-21 MED ORDER — ATORVASTATIN CALCIUM 80 MG PO TABS: 80.0000 mg | ORAL_TABLET | Freq: Every evening | ORAL | 1 refills | Status: DC

## 2018-03-21 NOTE — Progress Notes (Signed)
Name: Megan Short   MRN: [id number]    DOB: [birth year]   Date:03/21/2018       Progress Note  Subjective  Chief Complaint  Chief Complaint  Patient presents with  . Diabetes  . Hypertension  . Depression  . Hyperlipidemia  . Follow-up    HPI  HTN: taking medication daily and denies side effects. No chest pain, no palpitation, or orthostatic changes.  Only on Losartan at this time. 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, but last GFR improved and has been released from nephrologist  We will recheck labs today, bp is towards low end of normal today but since no orthostatic changes we will monitor   Hypothyroidism: denies fatigue,dry skin, dysphagiaor constipation She is taking Levothyroxine as prescribed last TSH at goal, we will recheck labs today   DMII: fsbs is at goal, low to mid 100's  She is exercise 3 days a week, last hgbA1C was 6.6%  to 6.4%, 6.6% down to 5.9%, ,6.4%,  5.9% and today is up to 6.4%, she states lately only takes Metformin ER 750 mg once a day and occasionally a second dose She denies polyphagia, polydipsia or polyuria. She is taking ARB and last urine micro has improved down from 100 to 50, and down to 14 back in July 2018.Seen byNephrologist - Dr. Thedore Mins - in the past. Doing well at this time, continue ARB, recheck labs and urine micro today   Major Depression: it was caused by death of her son 10 years ago( complications of lupus died at age 53), she is doing well on Celexa and BZD's very seldom. She denies anhedonia, or crying spells, still has difficulty making decisions ( like picking out an outfit) but not about finances, she states getting more patient with her mother again. She is doing better now   Dyslipidemia: on Atorvastatin, no side effects. HDL is low, discussed ways to improve her HDL. Continue high dose statin therapy , she has been compliant   Patient Active Problem List   Diagnosis Date Noted  . Umbilical hernia without obstruction and  without gangrene 09/13/2016  . Diabetes mellitus with renal manifestations, controlled (HCC) 09/07/2014  . Adult hypothyroidism 09/07/2014  . Vitamin D deficiency 09/07/2014  . Benign essential HTN 09/07/2014  . Microalbuminuria 09/07/2014  . Dyslipidemia 09/07/2014  . Major depression in full remission (HCC) 09/07/2014  . 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/07/2014  . Calculus of kidney 09/07/2014  . Memory loss or impairment 09/07/2014    Past Surgical History:  Procedure Laterality Date  . BREAST BIOPSY Right    negative times 2  . COLONOSCOPY WITH PROPOFOL N/A 01/20/2015   Procedure: COLONOSCOPY WITH PROPOFOL;  Surgeon: Scot Jun, MD;  Location: [facility]COPY;  Service: Endoscopy;  Laterality: N/A;  . LITHOTRIPSY Left 12/2013    Family History  Problem Relation Age of Onset  . Osteoarthritis Mother   . Osteoarthritis Sister   . Alzheimer's disease Maternal Aunt     Social History   Socioeconomic History  . Marital status: Married    Spouse name: Not on file  . Number of children: 1  . Years of education: Not on file  . Highest education level: Some college, no degree  Occupational History  . Occupation: retired     Comment: Clinical biochemist rep   Social Needs  . Financial resource strain: Not hard at all  . Food insecurity:    Worry: Never true    Inability: Never  true  . Transportation needs:    Medical: No    Non-medical: No  Tobacco Use  . Smoking status: Never Smoker  . Smokeless tobacco: Never Used  Substance and Sexual Activity  . Alcohol use: No    Alcohol/week: 0.0 standard drinks  . Drug use: No  . Sexual activity: Never  Lifestyle  . Physical activity:    Days per week: 3 days    Minutes per session: 120 min  . Stress: Not at all  Relationships  . Social connections:    Talks on phone: More than three times a week    Gets together: Three times a week    Attends religious service: More than 4 times per year     Active member of club or organization: No    Attends meetings of clubs or organizations: Never    Relationship status: Married  . Intimate partner violence:    Fear of current or ex partner: No    Emotionally abused: No    Physically abused: No    Forced sexual activity: No  Other Topics Concern  . Not on file  Social History Narrative   Married, they had one son that died with complications of lupus   Her mother lives with them     Current Outpatient Medications:  .  aspirin 81 MG tablet, Take by mouth., Disp: , Rfl:  .  atorvastatin (LIPITOR) 80 MG tablet, Take 1 tablet (80 mg total) by mouth every evening., Disp: 90 tablet, Rfl: 1 .  citalopram (CELEXA) 40 MG tablet, Take 1 tablet (40 mg total) by mouth daily., Disp: 90 tablet, Rfl: 1 .  clonazePAM (KLONOPIN) 0.5 MG tablet, Take 1 tablet (0.5 mg total) by mouth 2 (two) times daily as needed., Disp: 30 tablet, Rfl: 0 .  levothyroxine (SYNTHROID, LEVOTHROID) 88 MCG tablet, Daily and half on Sundays, Disp: 90 tablet, Rfl: 0 .  losartan (COZAAR) 100 MG tablet, Take 1 tablet (100 mg total) by mouth daily., Disp: 90 tablet, Rfl: 1 .  metFORMIN (GLUCOPHAGE-XR) 500 MG 24 hr tablet, Take 2 tablets (1,000 mg total) by mouth every evening. Taking two when she splurges, Disp: 180 tablet, Rfl: 1 .  Glucose Blood DISK, BAYER BREEZE 2 TEST (In Vitro Disk)  1 Disk check fsbs daily for 90 days  Quantity: 10;  Refills: 1   Ordered :07-Apr-2012  Alba Cory MD;  Started 07-Apr-2012 Active, Disp: , Rfl:  .  hydroquinone 4 % cream, Apply topically., Disp: , Rfl:  .  Multiple Vitamins-Minerals (MULTIVITAMIN WOMEN 50+) TABS, Take by mouth., Disp: , Rfl:   Allergies  Allergen Reactions  . Sulfa Antibiotics     I personally reviewed active problem list, medication list, allergies, family history, social history, health maintenance with the patient/caregiver today.   ROS  Constitutional: Negative for fever or weight change.  Respiratory: Negative  for cough and shortness of breath.   Cardiovascular: Negative for chest pain or palpitations.  Gastrointestinal: Negative for abdominal pain, no bowel changes.  Musculoskeletal: Negative for gait problem or joint swelling.  Skin: Negative for rash.  Neurological: Negative for dizziness or headache.  No other specific complaints in a complete review of systems (except as listed in HPI above).  Objective  Vitals:   03/21/18 1005  BP: 116/64  Pulse: 86  Temp: 98.4 F (36.9 C)  SpO2: 99%  Weight: 140 lb 14.4 oz (63.9 kg)  Height: 5' (1.524 m)    Body mass index is 27.52 kg/m.  Physical Exam  Constitutional: Patient appears well-developed and well-nourished. Overweight.  No distress.  HEENT: head atraumatic, normocephalic, pupils equal and reactive to light,neck supple, throat within normal limits Cardiovascular: Normal rate, regular rhythm and normal heart sounds.  No murmur heard. No BLE edema. Pulmonary/Chest: Effort normal and breath sounds normal. No respiratory distress. Abdominal: Soft.  There is no tenderness. Psychiatric: Patient has a normal mood and affect. behavior is normal. Judgment and thought content normal.   Recent Results (from the past 2160 hour(s))  POCT glycosylated hemoglobin (Hb A1C)     Status: Abnormal   Collection Time: 03/21/18 10:30 AM  Result Value Ref Range   Hemoglobin A1C 6.3 (A) 4.0 - 5.6 %   HbA1c POC (<> result, manual entry)     HbA1c, POC (prediabetic range)     HbA1c, POC (controlled diabetic range)        PHQ2/9: Depression screen [facility] 2/9 03/21/2018 11/17/2017 11/17/2017 09/13/2016 05/13/2016  Decreased Interest 0 0 0 0 0  Down, Depressed, Hopeless 0 0 0 0 0  PHQ - 2 Score 0 0 0 0 0  Altered sleeping 0 0 - - -  Tired, decreased energy 0 0 - - -  Change in appetite 0 0 - - -  Feeling bad or failure about yourself  0 0 - - -  Trouble concentrating 0 0 - - -  Moving slowly or fidgety/restless 0 0 - - -  Suicidal thoughts 0 0 - - -   PHQ-9 Score 0 0 - - -  Difficult doing work/chores - Not difficult at all - - -     Fall Risk: Fall Risk  03/21/2018 11/17/2017 07/19/2017 03/23/2017 09/13/2016  Falls in the past year? No No No No No  Number falls in past yr: - - - - -  Injury with Fall? - - - - -  Comment - - - - -     Functional Status Survey: Is the patient deaf or have difficulty hearing?: No Does the patient have difficulty seeing, even when wearing glasses/contacts?: No Does the patient have difficulty concentrating, remembering, or making decisions?: Yes Does the patient have difficulty walking or climbing stairs?: No Does the patient have difficulty dressing or bathing?: No Does the patient have difficulty doing errands alone such as visiting a doctor's office or shopping?: No    Assessment & Plan   2. Controlled type 2 diabetes mellitus with microalbuminuria, without long-term current use of insulin (HCC)  - POCT glycosylated hemoglobin (Hb A1C) - metFORMIN (GLUCOPHAGE-XR) 500 MG 24 hr tablet; Take 2 tablets (1,000 mg total) by mouth every evening. Taking two when she splurges  Dispense: 180 tablet; Refill: 1 - losartan (COZAAR) 100 MG tablet; Take 1 tablet (100 mg total) by mouth daily.  Dispense: 90 tablet; Refill: 1 - COMPLETE METABOLIC PANEL WITH GFR  1. Need for influenza vaccination  - Flu vaccine HIGH DOSE PF (Fluzone High dose)   3. Dyslipidemia  - atorvastatin (LIPITOR) 80 MG tablet; Take 1 tablet (80 mg total) by mouth every evening.  Dispense: 90 tablet; Refill: 1 - COMPLETE METABOLIC PANEL WITH GFR - Lipid panel  4. Major depression in remission (HCC)  - citalopram (CELEXA) 40 MG tablet; Take 1 tablet (40 mg total) by mouth daily.  Dispense: 90 tablet; Refill: 1 - clonazePAM (KLONOPIN) 0.5 MG tablet; Take 1 tablet (0.5 mg total) by mouth 2 (two) times daily as needed.  Dispense: 30 tablet; Refill: 0  5. Hypothyroidism, adult  -  TSH  6. Benign essential HTN  - losartan  (COZAAR) 100 MG tablet; Take 1 tablet (100 mg total) by mouth daily.  Dispense: 90 tablet; Refill: 1 - COMPLETE METABOLIC PANEL WITH GFR  7. Vitamin D deficiency  She needs to take otc supplementation   8. Microalbuminuria  - Urine Microalbumin w/creat. ratio

## 2018-03-22 LAB — COMPLETE METABOLIC PANEL WITH GFR
AG Ratio: 1.1 (calc) (ref 1.0–2.5)
ALKALINE PHOSPHATASE (APISO): 103 U/L (ref 33–130)
ALT: 22 U/L (ref 6–29)
AST: 44 U/L — AB (ref 10–35)
Albumin: 3.9 g/dL (ref 3.6–5.1)
BILIRUBIN TOTAL: 1 mg/dL (ref 0.2–1.2)
BUN/Creatinine Ratio: 19 (calc) (ref 6–22)
BUN: 20 mg/dL (ref 7–25)
CALCIUM: 10.1 mg/dL (ref 8.6–10.4)
CHLORIDE: 106 mmol/L (ref 98–110)
CO2: 24 mmol/L (ref 20–32)
Creat: 1.03 mg/dL — ABNORMAL HIGH (ref 0.60–0.93)
GFR, EST NON AFRICAN AMERICAN: 55 mL/min/{1.73_m2} — AB (ref >=60)
GFR, Est African American: 63 mL/min/{1.73_m2} (ref >=60)
Globulin: 3.4 g/dL (calc) (ref 1.9–3.7)
Glucose, Bld: 78 mg/dL (ref 65–99)
POTASSIUM: 4.1 mmol/L (ref 3.5–5.3)
Sodium: 140 mmol/L (ref 135–146)
Total Protein: 7.3 g/dL (ref 6.1–8.1)

## 2018-03-22 LAB — LIPID PANEL
CHOLESTEROL: 106 mg/dL (ref <200)
HDL: 31 mg/dL — AB (ref >50)
LDL Cholesterol (Calc): 58 mg/dL (calc)
Non-HDL Cholesterol (Calc): 75 mg/dL (calc) (ref <130)
TRIGLYCERIDES: 90 mg/dL (ref <150)
Total CHOL/HDL Ratio: 3.4 (calc) (ref <5.0)

## 2018-03-22 LAB — TSH: TSH: 0.12 m[IU]/L — AB (ref 0.40–4.50)

## 2018-03-22 LAB — MICROALBUMIN / CREATININE URINE RATIO
Creatinine, Urine: 191 mg/dL (ref 20–275)
MICROALB/CREAT RATIO: 29 ug/mg{creat} (ref <30)
Microalb, Ur: 5.6 mg/dL

## 2018-03-23 ENCOUNTER — Other Ambulatory Visit: Payer: Self-pay | Admitting: Family Medicine

## 2018-03-23 DIAGNOSIS — E039 Hypothyroidism, unspecified: Secondary | ICD-10-CM

## 2018-03-23 DIAGNOSIS — E038 Other specified hypothyroidism: Secondary | ICD-10-CM

## 2018-03-23 MED ORDER — LEVOTHYROXINE SODIUM 75 MCG PO TABS: 75.0000 ug | ORAL_TABLET | Freq: Every day | ORAL | 1 refills | Status: DC

## 2018-03-24 ENCOUNTER — Ambulatory Visit (INDEPENDENT_AMBULATORY_CARE_PROVIDER_SITE_OTHER): Payer: Medicare Other | Admitting: Nurse Practitioner

## 2018-03-24 ENCOUNTER — Encounter: Payer: Self-pay | Admitting: Nurse Practitioner

## 2018-03-24 VITALS — BP 130/80 | HR 84 | Temp 97.5°F | Resp 16 | Ht 59.0 in | Wt 144.2 lb

## 2018-03-24 DIAGNOSIS — Z0000 Encounter for general adult medical examination without abnormal findings: Secondary | ICD-10-CM | POA: Diagnosis not present

## 2018-03-24 NOTE — Patient Instructions (Addendum)
Megan Short , Thank you for taking time to come for your Medicare Wellness Visit. I appreciate your ongoing commitment to your health goals. Please review the following plan we discussed and let me know if I can assist you in the future.   Screening recommendations/referrals: Colorectal Screening: due 2026 Mammogram: due 10/20/2018   Vision and Dental Exams: Recommended annual ophthalmology exams for early detection of glaucoma and other disorders of the eye Recommended annual dental exams for proper oral hygiene  Diabetic Exams: Diabetic Eye Exam: next due 04/22/2018 Diabetic Foot Exam: completed today  Vaccinations: Influenza vaccine: up to date  Pneumococcal vaccine: up to date  Tdap vaccine: due 2021; Shingles vaccine: Please call your insurance company to determine your out of pocket expense for the Shingrix vaccine. You may receive this vaccine at your local pharmacy.  Advanced directives: Advance directives discussed with you today. I have provided a copy for you to complete at home and have notarized. Once this is complete please bring a copy in to our office so we can scan it into your chart.   Goals:  Increase physical activity to at least one hour, four time a week Increase fruit and vegetable intake (goal 5 servings combined a day) Decrease overall carb intake.   Next appointment: Please schedule your Annual Wellness Visit with your Nurse Health Advisor in one year.  Preventive Care 34 Years and Older, Female Preventive care refers to lifestyle choices and visits with your health care provider that can promote health and wellness. What does preventive care include?  A yearly physical exam. This is also called an annual well check.  Dental exams once or twice a year.  Routine eye exams. Ask your health care provider how often you should have your eyes checked.  Personal lifestyle choices, including:  Daily care of your teeth and gums.  Regular physical  activity.  Eating a healthy diet.  Avoiding tobacco and drug use.  Limiting alcohol use.  Practicing safe sex.  Taking low-dose aspirin every day if recommended by your health care provider.  Taking vitamin and mineral supplements as recommended by your health care provider. What happens during an annual well check? The services and screenings done by your health care provider during your annual well check will depend on your age, overall health, lifestyle risk factors, and family history of disease. Counseling  Your health care provider may ask you questions about your:  Alcohol use.  Tobacco use.  Drug use.  Emotional well-being.  Home and relationship well-being.  Sexual activity.  Eating habits.  History of falls.  Memory and ability to understand (cognition).  Work and work Astronomer.  Reproductive health. Screening  You may have the following tests or measurements:  Height, weight, and BMI.  Blood pressure.  Lipid and cholesterol levels. These may be checked every 5 years, or more frequently if you are over 73 years old.  Skin check.  Lung cancer screening. You may have this screening every year starting at age 40 if you have a 30-pack-year history of smoking and currently smoke or have quit within the past 15 years.  Fecal occult blood test (FOBT) of the stool. You may have this test every year starting at age 41.  Flexible sigmoidoscopy or colonoscopy. You may have a sigmoidoscopy every 5 years or a colonoscopy every 10 years starting at age 59.  Hepatitis C blood test.  Hepatitis B blood test.  Sexually transmitted disease (STD) testing.  Diabetes screening. This is done  by checking your blood sugar (glucose) after you have not eaten for a while (fasting). You may have this done every 1-3 years.  Bone density scan. This is done to screen for osteoporosis. You may have this done starting at age 66.  Mammogram. This may be done every 1-2  years. Talk to your health care provider about how often you should have regular mammograms. Talk with your health care provider about your test results, treatment options, and if necessary, the need for more tests. Vaccines  Your health care provider may recommend certain vaccines, such as:  Influenza vaccine. This is recommended every year.  Tetanus, diphtheria, and acellular pertussis (Tdap, Td) vaccine. You may need a Td booster every 10 years.  Zoster vaccine. You may need this after age 2.  Pneumococcal 13-valent conjugate (PCV13) vaccine. One dose is recommended after age 58.  Pneumococcal polysaccharide (PPSV23) vaccine. One dose is recommended after age 22. Talk to your health care provider about which screenings and vaccines you need and how often you need them. This information is not intended to replace advice given to you by your health care provider. Make sure you discuss any questions you have with your health care provider. Document Released: 06/20/2015 Document Revised: 02/11/2016 Document Reviewed: 03/25/2015 Elsevier Interactive Patient Education  2017 [place] Prevention in the Home Falls can cause injuries. They can happen to people of all ages. There are many things you can do to make your home safe and to help prevent falls. What can I do on the outside of my home?  Regularly fix the edges of walkways and driveways and fix any cracks.  Remove anything that might make you trip as you walk through a door, such as a raised step or threshold.  Trim any bushes or trees on the path to your home.  Use bright outdoor lighting.  Clear any walking paths of anything that might make someone trip, such as rocks or tools.  Regularly check to see if handrails are loose or broken. Make sure that both sides of any steps have handrails.  Any raised decks and porches should have guardrails on the edges.  Have any leaves, snow, or ice cleared regularly.  Use  sand or salt on walking paths during winter.  Clean up any spills in your garage right away. This includes oil or grease spills. What can I do in the bathroom?  Use night lights.  Install grab bars by the toilet and in the tub and shower. Do not use towel bars as grab bars.  Use non-skid mats or decals in the tub or shower.  If you need to sit down in the shower, use a plastic, non-slip stool.  Keep the floor dry. Clean up any water that spills on the floor as soon as it happens.  Remove soap buildup in the tub or shower regularly.  Attach bath mats securely with double-sided non-slip rug tape.  Do not have throw rugs and other things on the floor that can make you trip. What can I do in the bedroom?  Use night lights.  Make sure that you have a light by your bed that is easy to reach.  Do not use any sheets or blankets that are too big for your bed. They should not hang down onto the floor.  Have a firm chair that has side arms. You can use this for support while you get dressed.  Do not have throw rugs and other things on  the floor that can make you trip. What can I do in the kitchen?  Clean up any spills right away.  Avoid walking on wet floors.  Keep items that you use a lot in easy-to-reach places.  If you need to reach something above you, use a strong step stool that has a grab bar.  Keep electrical cords out of the way.  Do not use floor polish or wax that makes floors slippery. If you must use wax, use non-skid floor wax.  Do not have throw rugs and other things on the floor that can make you trip. What can I do with my stairs?  Do not leave any items on the stairs.  Make sure that there are handrails on both sides of the stairs and use them. Fix handrails that are broken or loose. Make sure that handrails are as long as the stairways.  Check any carpeting to make sure that it is firmly attached to the stairs. Fix any carpet that is loose or worn.  Avoid  having throw rugs at the top or bottom of the stairs. If you do have throw rugs, attach them to the floor with carpet tape.  Make sure that you have a light switch at the top of the stairs and the bottom of the stairs. If you do not have them, ask someone to add them for you. What else can I do to help prevent falls?  Wear shoes that:  Do not have high heels.  Have rubber bottoms.  Are comfortable and fit you well.  Are closed at the toe. Do not wear sandals.  If you use a stepladder:  Make sure that it is fully opened. Do not climb a closed stepladder.  Make sure that both sides of the stepladder are locked into place.  Ask someone to hold it for you, if possible.  Clearly mark and make sure that you can see:  Any grab bars or handrails.  First and last steps.  Where the edge of each step is.  Use tools that help you move around (mobility aids) if they are needed. These include:  Canes.  Walkers.  Scooters.  Crutches.  Turn on the lights when you go into a dark area. Replace any light bulbs as soon as they burn out.  Set up your furniture so you have a clear path. Avoid moving your furniture around.  If any of your floors are uneven, fix them.  If there are any pets around you, be aware of where they are.  Review your medicines with your doctor. Some medicines can make you feel dizzy. This can increase your chance of falling. Ask your doctor what other things that you can do to help prevent falls. This information is not intended to replace advice given to you by your health care provider. Make sure you discuss any questions you have with your health care provider. Document Released: 03/20/2009 Document Revised: 10/30/2015 Document Reviewed: 06/28/2014 Elsevier Interactive Patient Education  2017 [employer].

## 2018-03-24 NOTE — Progress Notes (Signed)
Patient: Megan Short, Female    DOB: 1945/11/19, 72 y.o.   MRN: [id number]  Visit Date: 03/24/2018  Today's Provider: Cheryle Horsfall, NP   Chief Complaint  Patient presents with  . Medicare Wellness    Subjective:    HPI Megan Short is a 72 y.o. female who presents today for her Subsequent Annual Wellness Visit.  Patient/Caregiver input:   No concerns   Past Medical History:  Diagnosis Date  . Anxiety   . Depression   . Diabetes mellitus without complication (HCC)   . Hyperlipidemia   . Hypertension   . Hypothyroidism     Past Surgical History:  Procedure Laterality Date  . BREAST BIOPSY Right    negative times 2  . COLONOSCOPY WITH PROPOFOL N/A 01/20/2015   Procedure: COLONOSCOPY WITH PROPOFOL;  Surgeon: Scot Jun, MD;  Location: [facility] ENDOSCOPY;  Service: Endoscopy;  Laterality: N/A;  . LITHOTRIPSY Left 12/2013    Family History  Problem Relation Age of Onset  . Osteoarthritis Mother   . Osteoarthritis Sister   . Alzheimer's disease Maternal Aunt     Social History   Socioeconomic History  . Marital status: Married    Spouse name: [name]   . Number of children: 1  . Years of education: Not on file  . Highest education level: Some college, no degree  Occupational History  . Occupation: retired     Comment: Clinical biochemist rep   Social Needs  . Financial resource strain: Not hard at all  . Food insecurity:    Worry: Never true    Inability: Never true  . Transportation needs:    Medical: No    Non-medical: No  Tobacco Use  . Smoking status: Never Smoker  . Smokeless tobacco: Never Used  Substance and Sexual Activity  . Alcohol use: No    Alcohol/week: 0.0 standard drinks  . Drug use: No  . Sexual activity: Not Currently  Lifestyle  . Physical activity:    Days per week: 2 days    Minutes per session: 90 min  . Stress: Not at all  Relationships  . Social connections:    Talks on phone: More than three times a week    Gets together:  Three times a week    Attends religious service: More than 4 times per year    Active member of club or organization: No    Attends meetings of clubs or organizations: Never    Relationship status: Married  . Intimate partner violence:    Fear of current or ex partner: No    Emotionally abused: No    Physically abused: No    Forced sexual activity: No  Other Topics Concern  . Not on file  Social History Narrative   Married, they had one son that died with complications of lupus   Her mother lives with them.   occassionally substite teacher.     Outpatient Encounter Medications as of 03/24/2018  Medication Sig Note  . aspirin 81 MG tablet Take by mouth. 09/07/2014: Received from: [facility]  . citalopram (CELEXA) 40 MG tablet Take 1 tablet (40 mg total) by mouth daily.   . clonazePAM (KLONOPIN) 0.5 MG tablet Take 1 tablet (0.5 mg total) by mouth 2 (two) times daily as needed.   . Glucose Blood DISK BAYER BREEZE 2 TEST (In Vitro Disk)  1 Disk check fsbs daily for 90 days  Quantity: 10;  Refills: 1   Ordered :07-Apr-2012  SOWLES,  [facility] MD;  Mora Appl 07-Apr-2012 Active 09/07/2014: Received from: [facility]  . levothyroxine (SYNTHROID, LEVOTHROID) 75 MCG tablet Take 1 tablet (75 mcg total) by mouth daily. New dose   . losartan (COZAAR) 100 MG tablet Take 1 tablet (100 mg total) by mouth daily.   . metFORMIN (GLUCOPHAGE-XR) 500 MG 24 hr tablet Take 2 tablets (1,000 mg total) by mouth every evening. Taking two when she splurges   . atorvastatin (LIPITOR) 80 MG tablet Take 1 tablet (80 mg total) by mouth every evening.   . hydroquinone 4 % cream Apply topically.   . Multiple Vitamins-Minerals (MULTIVITAMIN WOMEN 50+) TABS Take by mouth. 03/23/2017: Not taking    No facility-administered encounter medications on file as of 03/24/2018.     Allergies  Allergen Reactions  . Sulfa Antibiotics     Care Team Updated in EHR: Yes   Diet Recall and Exercise  Regimen:  Exercises 3-4 times a week for 1-2 hours, treadmill; bike and weights  Breakfast- sausage, toast, eggs, bacon, strawberry jam, coffee Snack- microwave popcorn and fruit Dinner- skips or has sandwich without bread, popcorn, occasionally chicken or pork chops, green beans, corn, pinto beans , fried fish Drinks- water; rarely drinks a pepsi      Objective:   Vitals: BP 130/80 (BP Location: Left Arm, Patient Position: Sitting, Cuff Size: Normal)   Pulse 84   Temp (!) 97.5 F (36.4 C) (Oral)   Resp 16   Ht 4\' 11"  (1.499 m)   Wt 144 lb 3.2 oz (65.4 kg)   SpO2 99%   BMI 29.12 kg/m  Body mass index is 29.12 kg/m.  Diabetic Foot Exam - Simple   Simple Foot Form Diabetic Foot exam was performed with the following findings:  Yes 03/24/2018 12:19 PM  Visual Inspection No deformities, no ulcerations, no other skin breakdown bilaterally:  Yes Sensation Testing Intact to touch and monofilament testing bilaterally:  Yes Pulse Check Posterior Tibialis and Dorsalis pulse intact bilaterally:  Yes Comments     Fall Risk: Fall Risk  03/24/2018 03/21/2018 11/17/2017 07/19/2017 03/23/2017  Falls in the past year? No No No No No  Number falls in past yr: - - - - -  Injury with Fall? - - - - -  Comment - - - - -    FALL RISK PREVENTION PERTAINING TO THE HOME:  Any stairs in or around the home WITH handrails? No  Home free of loose throw rugs in walkways, pet beds, electrical cords, etc? Yes  Adequate lighting in your home to reduce risk of falls? Yes   ASSISTIVE DEVICES UTILIZED TO PREVENT FALLS:  Life alert? No  Use of a cane, walker or w/c? No  Grab bars in the bathroom? Yes  Shower chair or bench in shower? No  Elevated toilet seat or a handicapped toilet? Yes   DME ORDERS:  DME order needed?  no  TIMED UP AND GO:  Was the test performed? Yes .  Length of time to ambulate 10 feet: 10 sec.   GAIT:  Appearance of gait: Gait stead-fast without the use of an  assistive device. Education: Fall risk prevention has been discussed.  Intervention(s) required? No   Zostavax completed 2012. Due for Shingrix. Education has been provided regarding the importance of this vaccine. Pt has been advised to call insurance company to determine out of pocket expense. Advised may also receive vaccine at local pharmacy or Health Dept. Verbalized acceptance and understanding.  Tdap: due 2021  Flu Vaccine: up to date   Pneumococcal Vaccine: up to date   Depression Screen Depression screen [facility] 2/9 03/21/2018 11/17/2017 11/17/2017 09/13/2016 05/13/2016  Decreased Interest 0 0 0 0 0  Down, Depressed, Hopeless 0 0 0 0 0  PHQ - 2 Score 0 0 0 0 0  Altered sleeping 0 0 - - -  Tired, decreased energy 0 0 - - -  Change in appetite 0 0 - - -  Feeling bad or failure about yourself  0 0 - - -  Trouble concentrating 0 0 - - -  Moving slowly or fidgety/restless 0 0 - - -  Suicidal thoughts 0 0 - - -  PHQ-9 Score 0 0 - - -  Difficult doing work/chores - Not difficult at all - - -    Cancer Screenings:  Colorectal Screening: Completed 2016. Repeat every 10 years;  Mammogram: Completed 10/19/2017. Repeat every year;   Bone Density: Completed 2015. Results not documented in chart; will request records,   Lung Cancer Screening: (Low Dose CT Chest recommended if Age 68-80 years, 30 pack-year currently smoking OR have quit w/in 15years.) does not qualify.    Additional Screening:  Hepatitis C Screening: does qualify; Completed   Vision Screening: Recommended annual ophthalmology exams for early detection of glaucoma and other disorders of the eye. Is the patient up to date with their annual eye exam?  Yes  Who is the provider or what is the name of the office in which the pt attends annual eye exams? [facility]   Last Hearing Exam: last year; no issues Wears Hearing Aids: No   Dental Screening: Recommended annual dental exams for proper oral hygiene. Goes twice  a year.   Community Resource Referral:  CRR required this visit?  No   Advanced Care Planning: A voluntary discussion about advance care planning including the explanation and discussion of advance directives.  Discussed health care proxy and Living will, and the patient was able to identify a health care proxy as niece Azalia Bilis [phone].  Patient does not have a living will at present time. If patient does have living will, I have requested they bring this to the clinic to be scanned in to their chart. Does patient have a HCPOA?    no If yes, name and contact information:  Does patient have a living will or MOST form?  no Assessment & Plan:     Exercise Activities and Dietary recommendations Goals    . DIET - EAT MORE FRUITS AND VEGETABLES    . Increase physical activity     At least one hour 4 times a day       - Discussed health benefits of physical activity, and encouraged her to engage in regular exercise appropriate for her age and condition.      I have personally reviewed and addressed the Medicare Annual Wellness health risk assessment questionnaire and have noted the following in the patient's chart:  A.         Medical and social history & family history B.         Use of alcohol, tobacco, and illicit drugs  C.         Current medications and supplements D.         Functional and Cognitive ability and status E.         Nutritional status F.         Physical activity G.  Advance directives H.         List of other physicians I.          Hospitalizations, surgeries, and ER visits in previous 12 months J.         Vitals K.         Screenings such as hearing, vision, cognitive function, and depression L.         Referrals and appointments:   In addition, I have reviewed and discussed with patient certain preventive protocols, quality metrics, and best practice recommendations. A written personalized care plan for preventive services as well as general  preventive health recommendations were provided to patient.   See attached scanned questionnaire for additional information.   Return in about 1 year (around 03/25/2019) for MWV; 3 months with PCP; routine .

## 2018-06-15 ENCOUNTER — Ambulatory Visit (INDEPENDENT_AMBULATORY_CARE_PROVIDER_SITE_OTHER): Payer: Medicare Other | Admitting: Podiatry

## 2018-06-15 ENCOUNTER — Encounter: Payer: Self-pay | Admitting: Podiatry

## 2018-06-15 DIAGNOSIS — M79676 Pain in unspecified toe(s): Secondary | ICD-10-CM | POA: Diagnosis not present

## 2018-06-15 DIAGNOSIS — B351 Tinea unguium: Secondary | ICD-10-CM | POA: Diagnosis not present

## 2018-06-15 DIAGNOSIS — E0843 Diabetes mellitus due to underlying condition with diabetic autonomic (poly)neuropathy: Secondary | ICD-10-CM

## 2018-06-15 DIAGNOSIS — M79609 Pain in unspecified limb: Principal | ICD-10-CM

## 2018-06-15 NOTE — Progress Notes (Signed)
Complaint:  Visit Type: Patient returns to my office for continued preventative foot care services. Complaint: Patient states" my nails have grown long and thick and become painful to walk and wear shoes" Patient has been diagnosed with DM with no foot complications. The patient presents for preventative foot care services. No changes to ROS  Podiatric Exam: Vascular: dorsalis pedis and posterior tibial pulses are palpable bilateral. Capillary return is immediate. Temperature gradient is WNL. Skin turgor WNL  Sensorium: Normal Semmes Weinstein monofilament test. Normal tactile sensation bilaterally. Nail Exam: Pt has thick disfigured discolored nails with subungual debris noted bilateral entire nail hallux through fifth toenails Ulcer Exam: There is no evidence of ulcer or pre-ulcerative changes or infection. Orthopedic Exam: Muscle tone and strength are WNL. No limitations in general ROM. No crepitus or effusions noted. Foot type and digits show no abnormalities. HAV  B/L. Skin: No Porokeratosis. No infection or ulcers.    Diagnosis:  Onychomycosis, , Pain in right toe, pain in left toes  Treatment & Plan Procedures and Treatment: Consent by patient was obtained for treatment procedures.   Debridement of mycotic and hypertrophic toenails, 1 through 5 bilateral and clearing of subungual debris. No ulceration, no infection noted.  Return Visit-Office Procedure: Patient instructed to return to the office for a follow up visit 3 months for continued evaluation and treatment.    Tylon Kemmerling DPM 

## 2018-06-23 DIAGNOSIS — E119 Type 2 diabetes mellitus without complications: Secondary | ICD-10-CM | POA: Diagnosis not present

## 2018-06-23 LAB — HM DIABETES EYE EXAM

## 2018-06-27 ENCOUNTER — Encounter: Payer: Self-pay | Admitting: Family Medicine

## 2018-06-27 ENCOUNTER — Ambulatory Visit (INDEPENDENT_AMBULATORY_CARE_PROVIDER_SITE_OTHER): Payer: Medicare Other | Admitting: Family Medicine

## 2018-06-27 VITALS — BP 122/72 | HR 100 | Temp 97.8°F | Resp 18 | Ht 59.0 in | Wt 142.6 lb

## 2018-06-27 DIAGNOSIS — I10 Essential (primary) hypertension: Secondary | ICD-10-CM

## 2018-06-27 DIAGNOSIS — F325 Major depressive disorder, single episode, in full remission: Secondary | ICD-10-CM

## 2018-06-27 DIAGNOSIS — N182 Chronic kidney disease, stage 2 (mild): Secondary | ICD-10-CM

## 2018-06-27 DIAGNOSIS — E1129 Type 2 diabetes mellitus with other diabetic kidney complication: Secondary | ICD-10-CM

## 2018-06-27 DIAGNOSIS — E349 Endocrine disorder, unspecified: Secondary | ICD-10-CM | POA: Diagnosis not present

## 2018-06-27 DIAGNOSIS — E559 Vitamin D deficiency, unspecified: Secondary | ICD-10-CM

## 2018-06-27 DIAGNOSIS — E785 Hyperlipidemia, unspecified: Secondary | ICD-10-CM | POA: Diagnosis not present

## 2018-06-27 DIAGNOSIS — E039 Hypothyroidism, unspecified: Secondary | ICD-10-CM

## 2018-06-27 DIAGNOSIS — R809 Proteinuria, unspecified: Secondary | ICD-10-CM | POA: Diagnosis not present

## 2018-06-27 LAB — POCT GLYCOSYLATED HEMOGLOBIN (HGB A1C): HBA1C, POC (CONTROLLED DIABETIC RANGE): 11.3 % — AB (ref 0.0–7.0)

## 2018-06-27 NOTE — Patient Instructions (Addendum)
Call insurance to find out if you can get Shingrix vaccine, how much it costs and if you can do it in our office of if you need to go to the pharmacy   Ask your pharmacist if it is cheaper to pay cash for clonazepam

## 2018-06-27 NOTE — Progress Notes (Signed)
Name: Megan Short   MRN: [id number]    DOB: 1945/08/18   Date:06/27/2018       Progress Note  Subjective  Chief Complaint  Chief Complaint  Patient presents with  . Diabetes  . Depression  . Hyperlipidemia    HPI  HTN: taking medication daily and denies side effects. No chest pain, no palpitation, or orthostatic changes.  Only on Losartan at this time. 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, but last GFR improved and has been released from nephrologist last GFR stage II. Continue current medications  Hypothyroidism: denies fatigue,dry skin, dysphagiaor constipation She is taking Levothyroxine as prescribed last TSH at goal, last level was suppressed, we will recheck today.   DMII: fsbs is at goal, she checks only in am and low 100, highest 170 during the holidays. She is exercise 3 days a week. HbgA1C has been at goal, however in our office today it read as 11.3% we will send her to the lab to confirm the reading today , she is currently taking Metformin BID again down to 500 mg . She denies polyphagia, polydipsia or polyuria. She is taking ARB and last urine micro has improved down from 100 to 50,and now is normal .Seen byNephrologist - Dr. Thedore Mins - in the past. Hyperparathyroidism in the past   Major Depression: it was caused by death of her son 11 years ago( complications of lupus died at age 10), she is doing well on Celexa and BZD's very seldom. She denies anhedonia, or crying spells, still has difficulty making decisions ( like picking out an outfit) but not about finances. 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   Dyslipidemia: on Atorvastatin, no side effects. HDL is low, discussed ways to improve her HDL, LDL is at goal . Continue high dose statin therapy  Patient Active Problem List   Diagnosis Date Noted  . Umbilical hernia without  obstruction and without gangrene 09/13/2016  . Diabetes mellitus with renal manifestations, controlled (HCC) 09/07/2014  . Adult hypothyroidism 09/07/2014  . Vitamin D deficiency 09/07/2014  . Benign essential HTN 09/07/2014  . Microalbuminuria 09/07/2014  . Dyslipidemia 09/07/2014  . Major depression in full remission (HCC) 09/07/2014  . 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/07/2014  . Calculus of kidney 09/07/2014  . Memory loss or impairment 09/07/2014    Past Surgical History:  Procedure Laterality Date  . BREAST BIOPSY Right    negative times 2  . COLONOSCOPY WITH PROPOFOL N/A 01/20/2015   Procedure: COLONOSCOPY WITH PROPOFOL;  Surgeon: Scot Jun, MD;  Location: [facility] ENDOSCOPY;  Service: Endoscopy;  Laterality: N/A;  . LITHOTRIPSY Left 12/2013    Family History  Problem Relation Age of Onset  . Osteoarthritis Mother   . Osteoarthritis Sister   . Alzheimer's disease Maternal Aunt     Social History   Socioeconomic History  . Marital status: Married    Spouse name: [name]   . Number of children: 1  . Years of education: Not on file  . Highest education level: Some college, no degree  Occupational History  . Occupation: retired     Comment: Clinical biochemist rep   Social Needs  . Financial resource strain: Not hard at all  . Food insecurity:    Worry: Never true    Inability: Never true  . Transportation needs:  Medical: No    Non-medical: No  Tobacco Use  . Smoking status: Never Smoker  . Smokeless tobacco: Never Used  Substance and Sexual Activity  . Alcohol use: No    Alcohol/week: 0.0 standard drinks  . Drug use: No  . Sexual activity: Not Currently  Lifestyle  . Physical activity:    Days per week: 2 days    Minutes per session: 90 min  . Stress: Not at all  Relationships  . Social connections:    Talks on phone: More than three times a week    Gets together: Three times a week    Attends religious service: More than 4 times  per year    Active member of club or organization: No    Attends meetings of clubs or organizations: Never    Relationship status: Married  . Intimate partner violence:    Fear of current or ex partner: No    Emotionally abused: No    Physically abused: No    Forced sexual activity: No  Other Topics Concern  . Not on file  Social History Narrative   Married, they had one son that died with complications of lupus   Her mother lives with them.   occassionally substite teacher.      Current Outpatient Medications:  .  aspirin 81 MG tablet, Take by mouth., Disp: , Rfl:  .  atorvastatin (LIPITOR) 80 MG tablet, Take 1 tablet (80 mg total) by mouth every evening., Disp: 90 tablet, Rfl: 1 .  citalopram (CELEXA) 40 MG tablet, Take 1 tablet (40 mg total) by mouth daily., Disp: 90 tablet, Rfl: 1 .  clonazePAM (KLONOPIN) 0.5 MG tablet, Take 1 tablet (0.5 mg total) by mouth 2 (two) times daily as needed., Disp: 30 tablet, Rfl: 0 .  Glucose Blood DISK, BAYER BREEZE 2 TEST (In Vitro Disk)  1 Disk check fsbs daily for 90 days  Quantity: 10;  Refills: 1   Ordered :07-Apr-2012  [name], Trenesha Alcaide MD;  Started 07-Apr-2012 Active, Disp: , Rfl:  .  hydroquinone 4 % cream, Apply topically., Disp: , Rfl:  .  levothyroxine (SYNTHROID, LEVOTHROID) 75 MCG tablet, Take 1 tablet (75 mcg total) by mouth daily. New dose, Disp: 30 tablet, Rfl: 1 .  losartan (COZAAR) 100 MG tablet, Take 1 tablet (100 mg total) by mouth daily., Disp: 90 tablet, Rfl: 1 .  metFORMIN (GLUCOPHAGE-XR) 500 MG 24 hr tablet, Take 2 tablets (1,000 mg total) by mouth every evening. Taking two when she splurges, Disp: 180 tablet, Rfl: 1 .  Multiple Vitamins-Minerals (MULTIVITAMIN WOMEN 50+) TABS, Take by mouth., Disp: , Rfl:   Allergies  Allergen Reactions  . Sulfa Antibiotics     I personally reviewed active problem list, medication list, allergies, family history, social history with the patient/caregiver today.   ROS  Constitutional:  Negative for fever or weight change.  Respiratory: Negative for cough and shortness of breath.   Cardiovascular: Negative for chest pain or palpitations.  Gastrointestinal: Negative for abdominal pain, no bowel changes.  Musculoskeletal: Negative for gait problem or joint swelling.  Skin: Negative for rash.  Neurological: Negative for dizziness or headache.  No other specific complaints in a complete review of systems (except as listed in HPI above).  Objective  Vitals:   06/27/18 0831  BP: 122/72  Pulse: 100  Resp: 18  Temp: 97.8 F (36.6 C)  TempSrc: Oral  SpO2: 98%  Weight: 142 lb 9.6 oz (64.7 kg)  Height: 4\' 11"  (1.499  m)    Body mass index is 28.8 kg/m.  Physical Exam  Constitutional: Patient appears well-developed and well-nourished. Overweight.  No distress.  HEENT: head atraumatic, normocephalic, pupils equal and reactive to light, neck supple, throat within normal limits Cardiovascular: Normal rate, regular rhythm and normal heart sounds.  No murmur heard. No BLE edema. Pulmonary/Chest: Effort normal and breath sounds normal. No respiratory distress. Abdominal: Soft.  There is no tenderness. Psychiatric: Patient has a normal mood and affect. behavior is normal. Judgment and thought content normal.  Recent Results (from the past 2160 hour(s))  HM DIABETES EYE EXAM     Status: None   Collection Time: 06/23/18 12:00 AM  Result Value Ref Range   HM Diabetic Eye Exam No Retinopathy No Retinopathy     PHQ2/9: [facility] 06/27/2018 06/27/2018 03/21/2018 11/17/2017 11/17/2017  Decreased Interest 0 0 0 0 0  Down, Depressed, Hopeless 0 0 0 0 0  PHQ - 2 Score 0 0 0 0 0  Altered sleeping 0 0 0 0 -  Tired, decreased energy 0 0 0 0 -  Change in appetite 0 0 0 0 -  Feeling bad or failure about yourself  0 0 0 0 -  Trouble concentrating 0 0 0 0 -  Moving slowly or fidgety/restless 0 0 0 0 -  Suicidal thoughts 0 0 0 0 -  PHQ-9 Score 0 0 0 0 -  Difficult  doing work/chores Not difficult at all Not difficult at all - Not difficult at all -     Fall Risk: Fall Risk  06/27/2018 03/24/2018 03/21/2018 11/17/2017 07/19/2017  Falls in the past year? 0 No No No No  Number falls in past yr: - - - - -  Injury with Fall? - - - - -  Comment - - - - -     Functional Status Survey: Is the patient deaf or have difficulty hearing?: No Does the patient have difficulty seeing, even when wearing glasses/contacts?: Yes Does the patient have difficulty concentrating, remembering, or making decisions?: No Does the patient have difficulty walking or climbing stairs?: No Does the patient have difficulty dressing or bathing?: No Does the patient have difficulty doing errands alone such as visiting a doctor's office or shopping?: No    Assessment & Plan  1. Controlled type 2 diabetes mellitus with microalbuminuria, without long-term current use of insulin (HCC)  - POCT HgB A1C A1C today at the lab also because of our reading is very high  2. Major depression in remission [facility](HCC)  She is in remission   3. Essential hypertension  bp is at goal   4. Elevated parathyroid hormone  Kidney function improved, we will monitor   5. Vitamin D deficiency  Continue supplementation   6. Dyslipidemia  On Atorvastatin   7. Adult hypothyroidism  - TSH  8. Chronic kidney disease, stage II (mild)

## 2018-06-28 ENCOUNTER — Encounter: Payer: Self-pay | Admitting: Family Medicine

## 2018-06-28 LAB — HEMOGLOBIN A1C
HEMOGLOBIN A1C: 6.4 %{Hb} — AB (ref <5.7)
Mean Plasma Glucose: 137 (calc)
eAG (mmol/L): 7.6 (calc)

## 2018-06-28 LAB — TSH: TSH: 0.58 m[IU]/L (ref 0.40–4.50)

## 2018-08-06 ENCOUNTER — Other Ambulatory Visit: Payer: Self-pay | Admitting: Family Medicine

## 2018-08-06 DIAGNOSIS — E038 Other specified hypothyroidism: Secondary | ICD-10-CM

## 2018-09-05 ENCOUNTER — Other Ambulatory Visit: Payer: Self-pay | Admitting: Family Medicine

## 2018-09-05 DIAGNOSIS — Z1231 Encounter for screening mammogram for malignant neoplasm of breast: Secondary | ICD-10-CM

## 2018-09-14 ENCOUNTER — Ambulatory Visit: Payer: Medicare Other | Admitting: Podiatry

## 2018-09-27 ENCOUNTER — Other Ambulatory Visit: Payer: Self-pay | Admitting: Family Medicine

## 2018-09-27 DIAGNOSIS — R809 Proteinuria, unspecified: Principal | ICD-10-CM

## 2018-09-27 DIAGNOSIS — E1129 Type 2 diabetes mellitus with other diabetic kidney complication: Secondary | ICD-10-CM

## 2018-09-27 NOTE — Telephone Encounter (Signed)
Pt is scheduled °

## 2018-10-12 ENCOUNTER — Telehealth: Payer: Self-pay

## 2018-10-12 ENCOUNTER — Ambulatory Visit: Payer: Medicare Other | Admitting: Podiatry

## 2018-10-12 NOTE — Telephone Encounter (Signed)
Copied from CRM [id number]. Topic: Complaint - Billing/Coding >> Oct 12, 2018 11:15 AM Lorrine Kin, NT wrote: DOS: 06/27/2018 Details of complaint: Patient states that she received a statement from [facility] for her labs from 06/27/2018 stating that the labs were not necessary and they would not cover them. States that they had charged her credit card $74.20. Patient states that the tests were necessary. States that she spoke with [facility] and was told to call her PCP and make sure the codes were accurate. Please advise. How would the patient like to see this issue resolved? Would like someone to looking into the coding for the labs on 06/27/2018 and make sure they are accurate and refile with her insurance. Would like a call back.   Will automatically be routed to [facility] Coding pool. - will route to office also.

## 2018-10-13 NOTE — Telephone Encounter (Signed)
Spoke with [employer] with [facility] and she states the problem with her blood work was the A1C was not covered. The TSH was covered under her Insurance. Patient paid this bill on May 5th, 2020.

## 2018-10-24 ENCOUNTER — Encounter: Payer: Self-pay | Admitting: Family Medicine

## 2018-10-24 ENCOUNTER — Other Ambulatory Visit: Payer: Self-pay

## 2018-10-24 ENCOUNTER — Ambulatory Visit (INDEPENDENT_AMBULATORY_CARE_PROVIDER_SITE_OTHER): Payer: Medicare Other | Admitting: Family Medicine

## 2018-10-24 VITALS — BP 118/64 | HR 92 | Temp 98.2°F | Resp 16 | Ht 59.0 in | Wt 140.3 lb

## 2018-10-24 DIAGNOSIS — E038 Other specified hypothyroidism: Secondary | ICD-10-CM

## 2018-10-24 DIAGNOSIS — E1129 Type 2 diabetes mellitus with other diabetic kidney complication: Secondary | ICD-10-CM

## 2018-10-24 DIAGNOSIS — R809 Proteinuria, unspecified: Secondary | ICD-10-CM | POA: Diagnosis not present

## 2018-10-24 DIAGNOSIS — I10 Essential (primary) hypertension: Secondary | ICD-10-CM

## 2018-10-24 DIAGNOSIS — E785 Hyperlipidemia, unspecified: Secondary | ICD-10-CM

## 2018-10-24 DIAGNOSIS — F325 Major depressive disorder, single episode, in full remission: Secondary | ICD-10-CM | POA: Diagnosis not present

## 2018-10-24 DIAGNOSIS — E039 Hypothyroidism, unspecified: Secondary | ICD-10-CM | POA: Diagnosis not present

## 2018-10-24 DIAGNOSIS — N182 Chronic kidney disease, stage 2 (mild): Secondary | ICD-10-CM | POA: Diagnosis not present

## 2018-10-24 LAB — POCT GLYCOSYLATED HEMOGLOBIN (HGB A1C): HbA1c, POC (controlled diabetic range): 6.2 % (ref 0.0–7.0)

## 2018-10-24 MED ORDER — LOSARTAN POTASSIUM 100 MG PO TABS: 100.0000 mg | ORAL_TABLET | Freq: Every day | ORAL | 1 refills | Status: DC

## 2018-10-24 MED ORDER — ATORVASTATIN CALCIUM 80 MG PO TABS: 80.0000 mg | ORAL_TABLET | Freq: Every evening | ORAL | 1 refills | Status: DC

## 2018-10-24 MED ORDER — LEVOTHYROXINE SODIUM 75 MCG PO TABS
75.0000 ug | ORAL_TABLET | Freq: Every day | ORAL | 0 refills | Status: DC
Start: 2018-10-24 — End: 2019-01-25

## 2018-10-24 MED ORDER — CITALOPRAM HYDROBROMIDE 40 MG PO TABS: 40.0000 mg | ORAL_TABLET | Freq: Every day | ORAL | 1 refills | Status: DC

## 2018-10-24 MED ORDER — METFORMIN HCL ER 500 MG PO TB24: 1000.0000 mg | ORAL_TABLET | Freq: Every day | ORAL | 0 refills | Status: DC

## 2018-10-24 NOTE — Progress Notes (Signed)
Name: Megan Short   MRN: [id number]    DOB: 11-Sep-1945   Date:10/24/2018       Progress Note  Subjective  Chief Complaint  Chief Complaint  Patient presents with  . Medication Refill  . Diabetes    Pre-prandial every morning 100's Average  . Hypertension    Denies any symptoms  . Hypothyroidism  . Depression  . Dyslipidemia    HPI  HTN: taking medication daily and denies side effects. No chest pain, no palpitation, or orthostatic changes. Only on Losartan at this time. 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, but last GFR improved and has been released from nephrologistlast GFR stage II. We will recheck labs next visit   Hypothyroidism: denies fatigue,dry skin, dysphagiaor constipation She is taking Levothyroxine as prescribed last TSH at goal, last level was back to normal   DMII: fsbs is at goal, she checks only in am it can be as low as 93, usually low 100's once it went to 189 once after she ate sweets. She is exercise 3days a week. HgbA1C has been at goal, the reading of 11.3 back in 06/27/2018 was erroneous with correct value of 6.4% done at [facility], and today it is down to 6.2% She denies polyphagia, polydipsia, she noticed polyuria and nocturia only last night. She is taking ARB and last urine micro has improved down from 100 but last time it was normal.Seen byNephrologist - Dr. Thedore Mins - in the past. Hyperparathyroidism in the past   Major Depression: it was caused by death of her son 11 years ago( complications of lupus died at age 25), she is doing well on Celexa and BZD's very seldom. She denies anhedonia, or crying spells, still has difficulty making decisions ( like picking out an outfit) but not about finances. She is still her mother's caregiver . Her grandson finally moved out to the other grandmother's mother.  Dyslipidemia: on Atorvastatin, no side effects. HDL is low, discussed ways to improve her HDL, LDL is at goal . Continue high dose statin therapy.     Patient Active Problem List   Diagnosis Date Noted  . Umbilical hernia without obstruction and without gangrene 09/13/2016  . Diabetes mellitus with renal manifestations, controlled (HCC) 09/07/2014  . Adult hypothyroidism 09/07/2014  . Vitamin D deficiency 09/07/2014  . Benign essential HTN 09/07/2014  . Microalbuminuria 09/07/2014  . Dyslipidemia 09/07/2014  . Major depression in full remission (HCC) 09/07/2014  . 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/07/2014  . Calculus of kidney 09/07/2014  . Memory loss or impairment 09/07/2014    Past Surgical History:  Procedure Laterality Date  . BREAST BIOPSY Right    negative times 2  . COLONOSCOPY WITH PROPOFOL N/A 01/20/2015   Procedure: COLONOSCOPY WITH PROPOFOL;  Surgeon: Scot Jun, MD;  Location[facility] END[facility]COPY;  Service: Endoscopy;  Laterality: N/A;  . LITHOTRIPSY Left 12/2013    Family History  Problem Relation Age of Onset  . Osteoarthritis Mother   . Osteoarthritis Sister   . Alzheimer's disease Maternal Aunt     Social History   Socioeconomic History  . Marital status: Married    Spouse name: [name]   . Number of children: 1  . Years of education: Not on file  . Highest education level: Some college, no degree  Occupational History  . Occupation: retired     Comment: Clinical biochemist rep   Social Needs  . Financial resource strain: Not hard at all  . Food insecurity:  Worry: Never true    Inability: Never true  . Transportation needs:    Medical: No    Non-medical: No  Tobacco Use  . Smoking status: Never Smoker  . Smokeless tobacco: Never Used  Substance and Sexual Activity  . Alcohol use: No    Alcohol/week: 0.0 standard drinks  . Drug use: No  . Sexual activity: Not Currently  Lifestyle  . Physical activity:    Days per week: 3 days    Minutes per session: 90 min  . Stress: Not at all  Relationships  . Social connections:    Talks on phone: More than three times a week     Gets together: Three times a week    Attends religious service: More than 4 times per year    Active member of club or organization: No    Attends meetings of clubs or organizations: Never    Relationship status: Married  . Intimate partner violence:    Fear of current or ex partner: No    Emotionally abused: No    Physically abused: No    Forced sexual activity: No  Other Topics Concern  . Not on file  Social History Narrative   Married, they had one son that died with complications of lupus   Her mother lives with them.   occassionally substite teacher.      Current Outpatient Medications:  .  aspirin 81 MG tablet, Take by mouth., Disp: , Rfl:  .  atorvastatin (LIPITOR) 80 MG tablet, Take 1 tablet (80 mg total) by mouth every evening., Disp: 90 tablet, Rfl: 1 .  citalopram (CELEXA) 40 MG tablet, Take 1 tablet (40 mg total) by mouth daily., Disp: 90 tablet, Rfl: 1 .  clonazePAM (KLONOPIN) 0.5 MG tablet, Take 1 tablet (0.5 mg total) by mouth 2 (two) times daily as needed., Disp: 30 tablet, Rfl: 0 .  Glucose Blood DISK, BAYER BREEZE 2 TEST (In Vitro Disk)  1 Disk check fsbs daily for 90 days  Quantity: 10;  Refills: 1   Ordered :07-Apr-2012  [name], Blanchard Willhite MD;  Started 07-Apr-2012 Active, Disp: , Rfl:  .  levothyroxine (SYNTHROID, LEVOTHROID) 75 MCG tablet, TAKE 1 TABLET BY MOUTH ONCE DAILY BEFORE BREAKFAST, Disp: 90 tablet, Rfl: 0 .  losartan (COZAAR) 100 MG tablet, Take 1 tablet (100 mg total) by mouth daily., Disp: 90 tablet, Rfl: 1 .  metFORMIN (GLUCOPHAGE-XR) 500 MG 24 hr tablet, TAKE 2 TABLETS BY MOUTH IN THE EVENING *TAKING 2 WHEN SHE SPLURGES*, Disp: 180 tablet, Rfl: 0 .  Multiple Vitamins-Minerals (MULTIVITAMIN WOMEN 50+) TABS, Take by mouth., Disp: , Rfl:   Allergies  Allergen Reactions  . Sulfa Antibiotics     I personally reviewed active problem list, medication list, allergies, family history, social history with the patient/caregiver  today.   ROS  Constitutional: Negative for fever or weight change.  Respiratory: Negative for cough and shortness of breath.   Cardiovascular: Negative for chest pain or palpitations.  Gastrointestinal: Negative for abdominal pain, no bowel changes.  Musculoskeletal: Negative for gait problem or joint swelling.  Skin: Negative for rash.  Neurological: Negative for dizziness or headache.  No other specific complaints in a complete review of systems (except as listed in HPI above).  Objective  Vitals:   10/24/18 0908  BP: 118/64  Pulse: 92  Resp: 16  Temp: 98.2 F (36.8 C)  TempSrc: Oral  SpO2: 99%  Weight: 140 lb 4.8 oz (63.6 kg)  Height: 4\' 11"  (1.499  m)    Body mass index is 28.34 kg/m.  Physical Exam  Constitutional: Patient appears well-developed and well-nourished. Overweight.  No distress.  HEENT: head atraumatic, normocephalic, pupils equal and reactive to light, neck supple Cardiovascular: Normal rate, regular rhythm and normal heart sounds.  No murmur heard. No BLE edema. Pulmonary/Chest: Effort normal and breath sounds normal. No respiratory distress. Abdominal: Soft.  There is no tenderness. Psychiatric: Patient has a normal mood and affect. behavior is normal. Judgment and thought content normal.  Recent Results (from the past 2160 hour(s))  POCT HgB A1C     Status: Normal   Collection Time: 10/24/18  9:22 AM  Result Value Ref Range   Hemoglobin A1C     HbA1c POC (<> result, manual entry)     HbA1c, POC (prediabetic range)     HbA1c, POC (controlled diabetic range) 6.2 0.0 - 7.0 %      PHQ2/9: Depression screen [facility] 2/9 10/24/2018 06/27/2018 06/27/2018 03/21/2018 11/17/2017  Decreased Interest 0 0 0 0 0  Down, Depressed, Hopeless 0 0 0 0 0  PHQ - 2 Score 0 0 0 0 0  Altered sleeping 0 0 0 0 0  Tired, decreased energy 0 0 0 0 0  Change in appetite 0 0 0 0 0  Feeling bad or failure about yourself  0 0 0 0 0  Trouble concentrating 0 0 0 0 0  Moving  slowly or fidgety/restless 0 0 0 0 0  Suicidal thoughts 0 0 0 0 0  PHQ-9 Score 0 0 0 0 0  Difficult doing work/chores Not difficult at all Not difficult at all Not difficult at all - Not difficult at all    phq 9 is negative   Fall Risk: Fall Risk  10/24/2018 06/27/2018 03/24/2018 03/21/2018 11/17/2017  Falls in the past year? 0 0 No No No  Number falls in past yr: 0 - - - -  Injury with Fall? 0 - - - -  Comment - - - - -     Functional Status Survey: Is the patient deaf or have difficulty hearing?: No Does the patient have difficulty seeing, even when wearing glasses/contacts?: Yes Does the patient have difficulty concentrating, remembering, or making decisions?: No Does the patient have difficulty walking or climbing stairs?: No Does the patient have difficulty dressing or bathing?: No Does the patient have difficulty doing errands alone such as visiting a doctor's office or shopping?: No    Assessment & Plan   1. Controlled type 2 diabetes mellitus with microalbuminuria, without long-term current use of insulin (HCC)  - POCT HgB A1C - losartan (COZAAR) 100 MG tablet; Take 1 tablet (100 mg total) by mouth daily.  Dispense: 90 tablet; Refill: 1 - metFORMIN (GLUCOPHAGE-XR) 500 MG 24 hr tablet; Take 2 tablets (1,000 mg total) by mouth daily with breakfast.  Dispense: 180 tablet; Refill: 0  2. Major depression in remission (HCC)  - citalopram (CELEXA) 40 MG tablet; Take 1 tablet (40 mg total) by mouth daily.  Dispense: 90 tablet; Refill: 1  3. Essential hypertension  - losartan (COZAAR) 100 MG tablet; Take 1 tablet (100 mg total) by mouth daily.  Dispense: 90 tablet; Refill: 1  4. Adult hypothyroidism   5. Chronic kidney disease, stage II (mild)   6. Dyslipidemia  - atorvastatin (LIPITOR) 80 MG tablet; Take 1 tablet (80 mg total) by mouth every evening.  Dispense: 90 tablet; Refill: 1  7. Other specified hypothyroidism  - levothyroxine (SYNTHROID) 75  MCG tablet;  Take 1 tablet (75 mcg total) by mouth daily before breakfast.  Dispense: 90 tablet; Refill: 0

## 2018-12-04 ENCOUNTER — Ambulatory Visit: Payer: Medicare Other | Admitting: Podiatry

## 2018-12-05 ENCOUNTER — Other Ambulatory Visit: Payer: Self-pay

## 2018-12-05 ENCOUNTER — Ambulatory Visit
Admission: RE | Admit: 2018-12-05 | Discharge: 2018-12-05 | Disposition: A | Discharge status: Home / Self Care | Payer: Medicare Other | Source: Ambulatory Visit | Attending: Family Medicine | Admitting: Family Medicine

## 2018-12-05 DIAGNOSIS — Z1231 Encounter for screening mammogram for malignant neoplasm of breast: Secondary | ICD-10-CM

## 2019-01-25 ENCOUNTER — Other Ambulatory Visit: Payer: Self-pay | Admitting: Family Medicine

## 2019-01-25 DIAGNOSIS — F325 Major depressive disorder, single episode, in full remission: Secondary | ICD-10-CM

## 2019-01-25 DIAGNOSIS — I10 Essential (primary) hypertension: Secondary | ICD-10-CM

## 2019-01-25 DIAGNOSIS — E038 Other specified hypothyroidism: Secondary | ICD-10-CM

## 2019-01-25 DIAGNOSIS — R809 Proteinuria, unspecified: Secondary | ICD-10-CM

## 2019-01-25 DIAGNOSIS — E1129 Type 2 diabetes mellitus with other diabetic kidney complication: Secondary | ICD-10-CM

## 2019-01-25 NOTE — Telephone Encounter (Signed)
This refill cannot be delegated  Requested Prescriptions  Pending Prescriptions Disp Refills  . clonazePAM (KLONOPIN) 0.5 MG tablet [Pharmacy Med Name: clonazePAM 0.5 MG Oral Tablet] 30 tablet 0    Sig: Take 1 tablet by mouth twice daily as needed     Not Delegated - Psychiatry:  Anxiolytics/Hypnotics Failed - 01/25/2019 10:10 AM      Failed - This refill cannot be delegated      Failed - Urine Drug Screen completed in last 360 days.      Passed - Valid encounter within last 6 months    Recent Outpatient Visits          3 months ago Controlled type 2 diabetes mellitus with microalbuminuria, without long-term current use of insulin [facility])   [facility] [name], MD   7 months ago Controlled type 2 diabetes mellitus with microalbuminuria, without long-term current use of insulin [facility])   [facility] [name], MD   10 months ago Controlled type 2 diabetes mellitus with microalbuminuria, without long-term current use of insulin [facility])   [facility] [name], MD   1 year ago Controlled type 2 diabetes mellitus with microalbuminuria, without long-term current use of insulin [facility])   [facility] [name], MD   1 year ago Controlled type 2 diabetes mellitus with microalbuminuria, without long-term current use of insulin [facility])   [facility] [place], Drue Stager, MD      Future Appointments            Tomorrow [name], MD [facility] [place]   In 2 months  [facility]           Signed Prescriptions Disp Refills   losartan (COZAAR) 100 MG tablet 90 tablet 0    Sig: Take 1 tablet by mouth once daily     Cardiovascular:  Angiotensin Receptor Blockers Failed - 01/25/2019 10:10 AM      Failed - Cr in normal range and within 180 days    Creat  Date Value Ref Range Status  03/21/2018 1.03 (H) 0.60 - 0.93 mg/dL Final   Comment:    For patients >35 years of age, the reference limit for Creatinine is approximately 13% higher for people identified as African-American. .          Failed - K in normal range and within 180 days    Potassium  Date Value Ref Range Status  03/21/2018 4.1 3.5 - 5.3 mmol/L Final  01/01/2014 3.7 3.5 - 5.1 mmol/L Final         Passed - Patient is not pregnant      Passed - Last BP in normal range    BP Readings from Last 1 Encounters:  10/24/18 118/64         Passed - Valid encounter within last 6 months    Recent Outpatient Visits          3 months ago Controlled type 2 diabetes mellitus with microalbuminuria, without long-term current use of insulin [facility])   [facility] [place], Drue Stager, MD   7 months ago Controlled type 2 diabetes mellitus with microalbuminuria, without long-term current use of insulin [facility])   [facility] [place], Drue Stager, MD   10 months ago Controlled type 2 diabetes mellitus with microalbuminuria, without long-term current use of insulin [facility])   [facility] [name], MD   1 year  ago Controlled type 2 diabetes mellitus with microalbuminuria, without long-term current use of insulin [facility])   [facility] [place], Drue Stager, MD   1 year ago Controlled type 2 diabetes mellitus with microalbuminuria, without long-term current use of insulin [facility])   [facility] [name], MD      Future Appointments            Tomorrow [name], MD [facility] [place]   In 2 months  [facility]            levothyroxine (SYNTHROID) 75 MCG tablet 90 tablet 0    Sig: TAKE 1 TABLET BY MOUTH ONCE DAILY BEFORE BREAKFAST     Endocrinology:  Hypothyroid Agents Failed - 01/25/2019 10:10 AM      Failed - TSH needs to be rechecked within 3 months after an abnormal result. Refill until TSH is due.      Passed - TSH in  normal range and within 360 days    TSH  Date Value Ref Range Status  06/27/2018 0.58 0.40 - 4.50 mIU/L Final         Passed - Valid encounter within last 12 months    Recent Outpatient Visits          3 months ago Controlled type 2 diabetes mellitus with microalbuminuria, without long-term current use of insulin [facility])   [facility] [place], Drue Stager, MD   7 months ago Controlled type 2 diabetes mellitus with microalbuminuria, without long-term current use of insulin [facility])   [facility] [place], Drue Stager, MD   10 months ago Controlled type 2 diabetes mellitus with microalbuminuria, without long-term current use of insulin [facility])   [facility] [place], Drue Stager, MD   1 year ago Controlled type 2 diabetes mellitus with microalbuminuria, without long-term current use of insulin [facility])   [facility] [place], Drue Stager, MD   1 year ago Controlled type 2 diabetes mellitus with microalbuminuria, without long-term current use of insulin [facility])   [facility] [place], Drue Stager, MD      Future Appointments            Tomorrow [name], MD [facility] [place]   In 2 months  [facility]            metFORMIN (GLUCOPHAGE-XR) 500 MG 24 hr tablet 180 tablet 0    Sig: TAKE 2 TABLETS BY MOUTH ONCE DAILY WITH BREAKFAST     Endocrinology:  Diabetes - Biguanides Failed - 01/25/2019 10:10 AM      Failed - Cr in normal range and within 360 days    Creat  Date Value Ref Range Status  03/21/2018 1.03 (H) 0.60 - 0.93 mg/dL Final    Comment:    For patients >4 years of age, the reference limit for Creatinine is approximately 13% higher for people identified as African-American. .          Passed - HBA1C is between 0 and 7.9 and within 180 days    Hemoglobin A1C  Date Value Ref Range Status  09/04/2015 6.8  Final   HbA1c, POC (prediabetic range)  Date Value  Ref Range Status  11/17/2017 6.2 5.7 - 6.4 % Final   HbA1c, POC (controlled diabetic range)  Date Value Ref Range Status  10/24/2018 6.2 0.0 - 7.0 % Final         Passed - eGFR  in normal range and within 360 days    GFR, Est African American  Date Value Ref Range Status  03/21/2018 63 > OR = 60 mL/min/1.55m Final   GFR, Est Non African American  Date Value Ref Range Status  03/21/2018 55 (L) > OR = 60 mL/min/1.752mFinal         Passed - Valid encounter within last 6 months    Recent Outpatient Visits          3 months ago Controlled type 2 diabetes mellitus with microalbuminuria, without long-term current use of insulin ([facility]  [facility]   7 months ago Controlled type 2 diabetes mellitus with microalbuminuria, without long-term current use of insulin ([facility]  [facility]   10 months ago Controlled type 2 diabetes mellitus with microalbuminuria, without long-term current use of insulin ([facility]  [facility]MD   1 year ago Controlled type 2 diabetes mellitus with microalbuminuria, without long-term current use of insulin ([facility]  [facility] StagerMD   1 year ago Controlled type 2 diabetes mellitus with microalbuminuria, without long-term current use of insulin ([facility]  [facility] SizerMD      Future Appointments            Tomorrow SoSteele SizerMD [facility] In 2 months  [facility]

## 2019-01-26 ENCOUNTER — Encounter: Payer: Self-pay | Admitting: Family Medicine

## 2019-01-26 ENCOUNTER — Other Ambulatory Visit: Payer: Self-pay

## 2019-01-26 ENCOUNTER — Ambulatory Visit (INDEPENDENT_AMBULATORY_CARE_PROVIDER_SITE_OTHER): Payer: Medicare Other | Admitting: Family Medicine

## 2019-01-26 DIAGNOSIS — E349 Endocrine disorder, unspecified: Secondary | ICD-10-CM | POA: Diagnosis not present

## 2019-01-26 DIAGNOSIS — E559 Vitamin D deficiency, unspecified: Secondary | ICD-10-CM | POA: Diagnosis not present

## 2019-01-26 DIAGNOSIS — R809 Proteinuria, unspecified: Secondary | ICD-10-CM

## 2019-01-26 DIAGNOSIS — N183 Chronic kidney disease, stage 3 unspecified: Secondary | ICD-10-CM

## 2019-01-26 DIAGNOSIS — I129 Hypertensive chronic kidney disease with stage 1 through stage 4 chronic kidney disease, or unspecified chronic kidney disease: Secondary | ICD-10-CM

## 2019-01-26 DIAGNOSIS — E785 Hyperlipidemia, unspecified: Secondary | ICD-10-CM | POA: Diagnosis not present

## 2019-01-26 DIAGNOSIS — F325 Major depressive disorder, single episode, in full remission: Secondary | ICD-10-CM

## 2019-01-26 DIAGNOSIS — E1129 Type 2 diabetes mellitus with other diabetic kidney complication: Secondary | ICD-10-CM | POA: Diagnosis not present

## 2019-01-26 DIAGNOSIS — I10 Essential (primary) hypertension: Secondary | ICD-10-CM

## 2019-01-26 DIAGNOSIS — E038 Other specified hypothyroidism: Secondary | ICD-10-CM

## 2019-01-26 MED ORDER — ATORVASTATIN CALCIUM 80 MG PO TABS: 80.0000 mg | ORAL_TABLET | Freq: Every evening | ORAL | 1 refills | Status: DC

## 2019-01-26 MED ORDER — CLONAZEPAM 0.5 MG PO TABS: 0.5000 mg | ORAL_TABLET | Freq: Two times a day (BID) | ORAL | 0 refills | Status: DC | PRN

## 2019-01-26 MED ORDER — CITALOPRAM HYDROBROMIDE 40 MG PO TABS: 40.0000 mg | ORAL_TABLET | Freq: Every day | ORAL | 1 refills | Status: DC

## 2019-01-26 NOTE — Progress Notes (Signed)
Name: Megan Short Hoxworth   MRN: [id number]    DOB: Nov 01, 1945   Date:01/26/2019       Progress Note  Subjective  Chief Complaint  Chief Complaint  Patient presents with  . Diabetes  . Depression  . Hypertension  . Hyperlipidemia    I connected with  Megan Short Paternostro on 01/26/19 at  8:00 AM EDT by telephone and verified that I am speaking with the correct person using two identifiers.  I discussed the limitations, risks, security and privacy concerns of performing an evaluation and management service by telephone and the availability of in person appointments. Staff also discussed with the patient that there may be a patient responsible charge related to this service. Patient Location: home  Provider Location: [facility]   HPI   HTN: taking medication daily and denies side effects. No chest pain, no palpitation, or orthostatic changes. Only on Losartan at this time. 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, but last GFR improved, she is due for repeat labs and will return for flu shot and labs in the next month or so. BP at dental office last week was 140/90 but she has not checked at home lately, advised her to do so  Hypothyroidism: denies fatigue,dry skin, dysphagiaor constipation She is taking Levothyroxine as prescribed last TSH at goal,she is due for repeat labs   DMII: fsbs is at goal,she checks fasting and is usually 120's. She is exercise 3days a week. HgbA1C has been well controlled  She denies polyphagia, polydipsia, she noticed polyuria and nocturia only last night. She is taking ARB and last urine micro has improved down from 100 but last time it was normal.Seen byNephrologist - Dr. Thedore MinsSingh - in the pas but has been released t. Hyperparathyroidism in the pastand we will recheck labs  Major Depression: it was caused by death of her son 12years ago( complications of lupus died at age 72), she is doing well on Celexa and BZD's very seldom. She denies anhedonia, or  crying spells, still has difficulty making decisions ( like picking out an outfit) but not about finances. She is still her mother's caregiver . She has been upset about [place] that she bought at [employer] and were not installed property and seems defective  Dyslipidemia: on Atorvastatin, no side effects. HDL is low, discussed ways to improve her HDL, LDL is at goal. Continue high dose statintherapy. we will recheck labs  Vitamin D deficiency: we will recheck labs   Patient Active Problem List   Diagnosis Date Noted  . Umbilical hernia without obstruction and without gangrene 09/13/2016  . Diabetes mellitus with renal manifestations, controlled (HCC) 09/07/2014  . Adult hypothyroidism 09/07/2014  . Vitamin D deficiency 09/07/2014  . Benign essential HTN 09/07/2014  . Microalbuminuria 09/07/2014  . Dyslipidemia 09/07/2014  . Major depression in full remission (HCC) 09/07/2014  . 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/07/2014  . Calculus of kidney 09/07/2014  . Memory loss or impairment 09/07/2014    Past Surgical History:  Procedure Laterality Date  . BREAST BIOPSY Right    negative times 2  . COLONOSCOPY WITH PROPOFOL N/A 01/20/2015   Procedure: COLONOSCOPY WITH PROPOFOL;  Surgeon: Scot Junobert T Elliott, MD;  [facility];  Service: Endoscopy;  Laterality: N/A;  . LITHOTRIPSY Left 12/2013    Family History  Problem Relation Age of Onset  . Osteoarthritis Mother   . Osteoarthritis Sister   . Alzheimer's disease Maternal Aunt     Social  History   Socioeconomic History  . Marital status: Married    Spouse name: Mortimer Fries   . Number of children: 1  . Years of education: Not on file  . Highest education level: Some college, no degree  Occupational History  . Occupation: retired     Comment: Therapist, art rep   Social Needs  . Financial resource strain: Not hard at all  . Food insecurity    Worry: Never true    Inability: Never true  .  Transportation needs    Medical: No    Non-medical: No  Tobacco Use  . Smoking status: Never Smoker  . Smokeless tobacco: Never Used  Substance and Sexual Activity  . Alcohol use: No    Alcohol/week: 0.0 standard drinks  . Drug use: No  . Sexual activity: Not Currently  Lifestyle  . Physical activity    Days per week: 3 days    Minutes per session: 90 min  . Stress: Not at all  Relationships  . Social connections    Talks on phone: More than three times a week    Gets together: Three times a week    Attends religious service: More than 4 times per year    Active member of club or organization: No    Attends meetings of clubs or organizations: Never    Relationship status: Married  . Intimate partner violence    Fear of current or ex partner: No    Emotionally abused: No    Physically abused: No    Forced sexual activity: No  Other Topics Concern  . Not on file  Social History Narrative   Married, they had one son that died with complications of lupus   Her mother lives with them.   occassionally substite teacher.      Current Outpatient Medications:  .  aspirin 81 MG tablet, Take by mouth., Disp: , Rfl:  .  atorvastatin (LIPITOR) 80 MG tablet, Take 1 tablet (80 mg total) by mouth every evening., Disp: 90 tablet, Rfl: 1 .  citalopram (CELEXA) 40 MG tablet, Take 1 tablet (40 mg total) by mouth daily., Disp: 90 tablet, Rfl: 1 .  clonazePAM (KLONOPIN) 0.5 MG tablet, Take 1 tablet (0.5 mg total) by mouth 2 (two) times daily as needed., Disp: 30 tablet, Rfl: 0 .  Glucose Blood DISK, BAYER BREEZE 2 TEST (In Vitro Disk)  1 Disk check fsbs daily for 90 days  Quantity: 10;  Refills: 1   Ordered :07-Apr-2012  [name] MD;  Roe Rutherford Active, Disp: , Rfl:  .  levothyroxine (SYNTHROID) 75 MCG tablet, TAKE 1 TABLET BY MOUTH ONCE DAILY BEFORE BREAKFAST, Disp: 90 tablet, Rfl: 0 .  losartan (COZAAR) 100 MG tablet, Take 1 tablet by mouth once daily, Disp: 90 tablet, Rfl: 0 .   metFORMIN (GLUCOPHAGE-XR) 500 MG 24 hr tablet, TAKE 2 TABLETS BY MOUTH ONCE DAILY WITH BREAKFAST, Disp: 180 tablet, Rfl: 0 .  Multiple Vitamins-Minerals (MULTIVITAMIN WOMEN 50+) TABS, Take by mouth., Disp: , Rfl:   Allergies  Allergen Reactions  . Sulfa Antibiotics     I personally reviewed active problem list, medication list, allergies, family history, social history, health maintenance with the patient/caregiver today.   ROS  Constitutional: Negative for fever or weight change.  Respiratory: Negative for cough and shortness of breath.   Cardiovascular: Negative for chest pain or palpitations.  Gastrointestinal: Negative for abdominal pain, no bowel changes.  Musculoskeletal: Negative for gait problem or joint swelling.  Skin: Negative for rash.  Neurological: Negative for dizziness or headache.  No other specific complaints in a complete review of systems (except as listed in HPI above).  Objective  Virtual encounter, vitals not obtained.  There is no height or weight on file to calculate BMI.  Physical Exam  Awake, alert and oriented  PHQ2/9: Depression screen [facility] 2/9 01/26/2019 10/24/2018 06/27/2018 06/27/2018 03/21/2018  Decreased Interest 0 0 0 0 0  Down, Depressed, Hopeless 0 0 0 0 0  PHQ - 2 Score 0 0 0 0 0  Altered sleeping 0 0 0 0 0  Tired, decreased energy 0 0 0 0 0  Change in appetite 0 0 0 0 0  Feeling bad or failure about yourself  0 0 0 0 0  Trouble concentrating 0 0 0 0 0  Moving slowly or fidgety/restless 0 0 0 0 0  Suicidal thoughts 0 0 0 0 0  PHQ-9 Score 0 0 0 0 0  Difficult doing work/chores - Not difficult at all Not difficult at all Not difficult at all -   PHQ-2/9 Result is negative.    Fall Risk: Fall Risk  01/26/2019 10/24/2018 06/27/2018 03/24/2018 03/21/2018  Falls in the past year? 0 0 0 No No  Number falls in past yr: 0 0 - - -  Injury with Fall? 0 0 - - -  Comment - - - - -     Assessment & Plan  1. Dyslipidemia  - atorvastatin  (LIPITOR) 80 MG tablet; Take 1 tablet (80 mg total) by mouth every evening.  Dispense: 90 tablet; Refill: 1 - Lipid panel  2. Major depression in remission (HCC)  - citalopram (CELEXA) 40 MG tablet; Take 1 tablet (40 mg total) by mouth daily.  Dispense: 90 tablet; Refill: 1  3. Controlled type 2 diabetes mellitus with microalbuminuria, without long-term current use of insulin (HCC)  - Microalbumin / creatinine urine ratio - Hemoglobin A1c  4. Essential hypertension  - COMPLETE METABOLIC PANEL WITH GFR - CBC with Differential/Platelet  5. Other specified hypothyroidism  - TSH  6. Elevated parathyroid hormone  - VITAMIN D 25 Hydroxy (Vit-D Deficiency, Fractures) - Parathyroid hormone, intact (no Ca)  7. Vitamin D deficiency  - VITAMIN D 25 Hydroxy (Vit-D Deficiency, Fractures)  8. Chronic kidney disease (CKD), stage III (moderate) (HCC)  - COMPLETE METABOLIC PANEL WITH GFR - CBC with Differential/Platelet  9. Benign hypertension with chronic kidney disease, stage III (HCC)  - COMPLETE METABOLIC PANEL WITH GFR  I discussed the assessment and treatment plan with the patient. The patient was provided an opportunity to ask questions and all were answered. The patient agreed with the plan and demonstrated an understanding of the instructions.   The patient was advised to call back or seek an in-person evaluation if the symptoms worsen or if the condition fails to improve as anticipated.  I provided 25  minutes of non-face-to-face time during this encounter.  Ruel FavorsKrichna F Destin Vinsant, MD

## 2019-02-28 ENCOUNTER — Ambulatory Visit: Payer: Medicare Other

## 2019-03-02 ENCOUNTER — Telehealth: Payer: Self-pay

## 2019-03-02 NOTE — Telephone Encounter (Signed)
Copied from [place] [phone]. Topic: General - Call Back - No Documentation >> Mar 02, 2019 10:30 AM Erick Blinks wrote: Pt is requesting call back from Nurse, has questions about upcoming lab work. Please advise Best contact: [phone]  Patient is suppose to come in next week for labs, she has some congestion and wanted to make sure it is ok for her to come get labs and a flu shot. I told her it would be fine.

## 2019-03-06 ENCOUNTER — Other Ambulatory Visit: Payer: Self-pay

## 2019-03-06 DIAGNOSIS — Z20822 Contact with and (suspected) exposure to covid-19: Secondary | ICD-10-CM

## 2019-03-07 ENCOUNTER — Other Ambulatory Visit: Payer: Self-pay

## 2019-03-07 ENCOUNTER — Ambulatory Visit (INDEPENDENT_AMBULATORY_CARE_PROVIDER_SITE_OTHER): Payer: Medicare Other

## 2019-03-07 DIAGNOSIS — N183 Chronic kidney disease, stage 3 (moderate): Secondary | ICD-10-CM | POA: Diagnosis not present

## 2019-03-07 DIAGNOSIS — I10 Essential (primary) hypertension: Secondary | ICD-10-CM | POA: Diagnosis not present

## 2019-03-07 DIAGNOSIS — Z23 Encounter for immunization: Secondary | ICD-10-CM

## 2019-03-07 DIAGNOSIS — E785 Hyperlipidemia, unspecified: Secondary | ICD-10-CM | POA: Diagnosis not present

## 2019-03-07 DIAGNOSIS — E038 Other specified hypothyroidism: Secondary | ICD-10-CM | POA: Diagnosis not present

## 2019-03-07 DIAGNOSIS — I129 Hypertensive chronic kidney disease with stage 1 through stage 4 chronic kidney disease, or unspecified chronic kidney disease: Secondary | ICD-10-CM | POA: Diagnosis not present

## 2019-03-07 DIAGNOSIS — E1129 Type 2 diabetes mellitus with other diabetic kidney complication: Secondary | ICD-10-CM | POA: Diagnosis not present

## 2019-03-07 DIAGNOSIS — E349 Endocrine disorder, unspecified: Secondary | ICD-10-CM | POA: Diagnosis not present

## 2019-03-07 DIAGNOSIS — R809 Proteinuria, unspecified: Secondary | ICD-10-CM | POA: Diagnosis not present

## 2019-03-07 DIAGNOSIS — E559 Vitamin D deficiency, unspecified: Secondary | ICD-10-CM | POA: Diagnosis not present

## 2019-03-07 LAB — NOVEL CORONAVIRUS, NAA: SARS-CoV-2, NAA: NOT DETECTED

## 2019-03-08 LAB — COMPLETE METABOLIC PANEL WITH GFR
AG Ratio: 1.1 (calc) (ref 1.0–2.5)
ALT: 19 U/L (ref 6–29)
AST: 43 U/L — ABNORMAL HIGH (ref 10–35)
Albumin: 3.8 g/dL (ref 3.6–5.1)
Alkaline phosphatase (APISO): 104 U/L (ref 37–153)
BUN/Creatinine Ratio: 17 (calc) (ref 6–22)
BUN: 18 mg/dL (ref 7–25)
CO2: 28 mmol/L (ref 20–32)
Calcium: 10.2 mg/dL (ref 8.6–10.4)
Chloride: 107 mmol/L (ref 98–110)
Creat: 1.09 mg/dL — ABNORMAL HIGH (ref 0.60–0.93)
GFR, Est African American: 59 mL/min/{1.73_m2} — ABNORMAL LOW (ref >=60)
GFR, Est Non African American: 51 mL/min/{1.73_m2} — ABNORMAL LOW (ref >=60)
Globulin: 3.4 g/dL (calc) (ref 1.9–3.7)
Glucose, Bld: 120 mg/dL — ABNORMAL HIGH (ref 65–99)
Potassium: 4.4 mmol/L (ref 3.5–5.3)
Sodium: 141 mmol/L (ref 135–146)
Total Bilirubin: 0.6 mg/dL (ref 0.2–1.2)
Total Protein: 7.2 g/dL (ref 6.1–8.1)

## 2019-03-08 LAB — TSH: TSH: 0.68 mIU/L (ref 0.40–4.50)

## 2019-03-08 LAB — CBC WITH DIFFERENTIAL/PLATELET
Absolute Monocytes: 507 cells/uL (ref 200–950)
Basophils Absolute: 47 cells/uL (ref 0–200)
Basophils Relative: 0.8 %
Eosinophils Absolute: 177 cells/uL (ref 15–500)
Eosinophils Relative: 3 %
HCT: 39.4 % (ref 35.0–45.0)
Hemoglobin: 12.6 g/dL (ref 11.7–15.5)
Lymphs Abs: 2018 cells/uL (ref 850–3900)
MCH: 30 pg (ref 27.0–33.0)
MCHC: 32 g/dL (ref 32.0–36.0)
MCV: 93.8 fL (ref 80.0–100.0)
MPV: 9.8 fL (ref 7.5–12.5)
Monocytes Relative: 8.6 %
Neutro Abs: 3151 cells/uL (ref 1500–7800)
Neutrophils Relative %: 53.4 %
Platelets: 334 10*3/uL (ref 140–400)
RBC: 4.2 10*6/uL (ref 3.80–5.10)
RDW: 12.9 % (ref 11.0–15.0)
Total Lymphocyte: 34.2 %
WBC: 5.9 10*3/uL (ref 3.8–10.8)

## 2019-03-08 LAB — PARATHYROID HORMONE, INTACT (NO CA): PTH: 42 pg/mL (ref 14–64)

## 2019-03-08 LAB — VITAMIN D 25 HYDROXY (VIT D DEFICIENCY, FRACTURES): Vit D, 25-Hydroxy: 36 ng/mL (ref 30–100)

## 2019-03-08 LAB — LIPID PANEL
Cholesterol: 123 mg/dL (ref <200)
HDL: 30 mg/dL — ABNORMAL LOW (ref >=50)
LDL Cholesterol (Calc): 76 mg/dL (calc)
Non-HDL Cholesterol (Calc): 93 mg/dL (calc) (ref <130)
Total CHOL/HDL Ratio: 4.1 (calc) (ref <5.0)
Triglycerides: 89 mg/dL (ref <150)

## 2019-03-08 LAB — HEMOGLOBIN A1C
Hgb A1c MFr Bld: 6.4 % of total Hgb — ABNORMAL HIGH (ref <5.7)
Mean Plasma Glucose: 137 (calc)
eAG (mmol/L): 7.6 (calc)

## 2019-03-08 LAB — MICROALBUMIN / CREATININE URINE RATIO
Creatinine, Urine: 159 mg/dL (ref 20–275)
Microalb Creat Ratio: 9 mcg/mg creat (ref <30)
Microalb, Ur: 1.4 mg/dL

## 2019-03-27 ENCOUNTER — Ambulatory Visit (INDEPENDENT_AMBULATORY_CARE_PROVIDER_SITE_OTHER): Payer: Medicare Other

## 2019-03-27 ENCOUNTER — Telehealth: Payer: Self-pay

## 2019-03-27 VITALS — BP 128/84 | Ht 59.0 in | Wt 143.0 lb

## 2019-03-27 DIAGNOSIS — Z780 Asymptomatic menopausal state: Secondary | ICD-10-CM

## 2019-03-27 DIAGNOSIS — R809 Proteinuria, unspecified: Secondary | ICD-10-CM

## 2019-03-27 DIAGNOSIS — Z0000 Encounter for general adult medical examination without abnormal findings: Secondary | ICD-10-CM | POA: Diagnosis not present

## 2019-03-27 DIAGNOSIS — E1129 Type 2 diabetes mellitus with other diabetic kidney complication: Secondary | ICD-10-CM

## 2019-03-27 NOTE — Patient Instructions (Signed)
Megan Short , Thank you for taking time to come for your Medicare Wellness Visit. I appreciate your ongoing commitment to your health goals. Please review the following plan we discussed and let me know if I can assist you in the future.   Screening recommendations/referrals: Colonoscopy: done 01/20/15. Repeat in 2026. Mammogram: done 12/05/18 Bone Density: done 2015. Please call [phone] to schedule your bone density screening.  Recommended yearly ophthalmology/optometry visit for glaucoma screening and checkup Recommended yearly dental visit for hygiene and checkup  Vaccinations: Influenza vaccine: done 03/07/19 Pneumococcal vaccine: done 09/10/15 Tdap vaccine: done 05/27/10 Shingles vaccine: Shingrix discussed. Please contact your pharmacy for coverage information.   Advanced directives: Please bring a copy of your health care power of attorney and living will to the office at your convenience.  Conditions/risks identified: Keep up the great work!  Next appointment: Please follow up in one year for your Medicare Annual Wellness visit.     Preventive Care 72 Years and Older, Female Preventive care refers to lifestyle choices and visits with your health care provider that can promote health and wellness. What does preventive care include?  A yearly physical exam. This is also called an annual well check.  Dental exams once or twice a year.  Routine eye exams. Ask your health care provider how often you should have your eyes checked.  Personal lifestyle choices, including:  Daily care of your teeth and gums.  Regular physical activity.  Eating a healthy diet.  Avoiding tobacco and drug use.  Limiting alcohol use.  Practicing safe sex.  Taking low-dose aspirin every day.  Taking vitamin and mineral supplements as recommended by your health care provider. What happens during an annual well check? The services and screenings done by your health care provider during your  annual well check will depend on your age, overall health, lifestyle risk factors, and family history of disease. Counseling  Your health care provider may ask you questions about your:  Alcohol use.  Tobacco use.  Drug use.  Emotional well-being.  Home and relationship well-being.  Sexual activity.  Eating habits.  History of falls.  Memory and ability to understand (cognition).  Work and work Statistician.  Reproductive health. Screening  You may have the following tests or measurements:  Height, weight, and BMI.  Blood pressure.  Lipid and cholesterol levels. These may be checked every 5 years, or more frequently if you are over 64 years old.  Skin check.  Lung cancer screening. You may have this screening every year starting at age 45 if you have a 30-pack-year history of smoking and currently smoke or have quit within the past 15 years.  Fecal occult blood test (FOBT) of the stool. You may have this test every year starting at age 3.  Flexible sigmoidoscopy or colonoscopy. You may have a sigmoidoscopy every 5 years or a colonoscopy every 10 years starting at age 71.  Hepatitis C blood test.  Hepatitis B blood test.  Sexually transmitted disease (STD) testing.  Diabetes screening. This is done by checking your blood sugar (glucose) after you have not eaten for a while (fasting). You may have this done every 1-3 years.  Bone density scan. This is done to screen for osteoporosis. You may have this done starting at age 77.  Mammogram. This may be done every 1-2 years. Talk to your health care provider about how often you should have regular mammograms. Talk with your health care provider about your test results, treatment options, and  if necessary, the need for more tests. Vaccines  Your health care provider may recommend certain vaccines, such as:  Influenza vaccine. This is recommended every year.  Tetanus, diphtheria, and acellular pertussis (Tdap, Td)  vaccine. You may need a Td booster every 10 years.  Zoster vaccine. You may need this after age 78.  Pneumococcal 13-valent conjugate (PCV13) vaccine. One dose is recommended after age 68.  Pneumococcal polysaccharide (PPSV23) vaccine. One dose is recommended after age 40. Talk to your health care provider about which screenings and vaccines you need and how often you need them. This information is not intended to replace advice given to you by your health care provider. Make sure you discuss any questions you have with your health care provider. Document Released: 06/20/2015 Document Revised: 02/11/2016 Document Reviewed: 03/25/2015 Elsevier Interactive Patient Education  2017 [employer] Prevention in the Home Falls can cause injuries. They can happen to people of all ages. There are many things you can do to make your home safe and to help prevent falls. What can I do on the outside of my home?  Regularly fix the edges of walkways and driveways and fix any cracks.  Remove anything that might make you trip as you walk through a door, such as a raised step or threshold.  Trim any bushes or trees on the path to your home.  Use bright outdoor lighting.  Clear any walking paths of anything that might make someone trip, such as rocks or tools.  Regularly check to see if handrails are loose or broken. Make sure that both sides of any steps have handrails.  Any raised decks and porches should have guardrails on the edges.  Have any leaves, snow, or ice cleared regularly.  Use sand or salt on walking paths during winter.  Clean up any spills in your garage right away. This includes oil or grease spills. What can I do in the bathroom?  Use night lights.  Install grab bars by the toilet and in the tub and shower. Do not use towel bars as grab bars.  Use non-skid mats or decals in the tub or shower.  If you need to sit down in the shower, use a plastic, non-slip stool.   Keep the floor dry. Clean up any water that spills on the floor as soon as it happens.  Remove soap buildup in the tub or shower regularly.  Attach bath mats securely with double-sided non-slip rug tape.  Do not have throw rugs and other things on the floor that can make you trip. What can I do in the bedroom?  Use night lights.  Make sure that you have a light by your bed that is easy to reach.  Do not use any sheets or blankets that are too big for your bed. They should not hang down onto the floor.  Have a firm chair that has side arms. You can use this for support while you get dressed.  Do not have throw rugs and other things on the floor that can make you trip. What can I do in the kitchen?  Clean up any spills right away.  Avoid walking on wet floors.  Keep items that you use a lot in easy-to-reach places.  If you need to reach something above you, use a strong step stool that has a grab bar.  Keep electrical cords out of the way.  Do not use floor polish or wax that makes floors slippery. If you  must use wax, use non-skid floor wax.  Do not have throw rugs and other things on the floor that can make you trip. What can I do with my stairs?  Do not leave any items on the stairs.  Make sure that there are handrails on both sides of the stairs and use them. Fix handrails that are broken or loose. Make sure that handrails are as long as the stairways.  Check any carpeting to make sure that it is firmly attached to the stairs. Fix any carpet that is loose or worn.  Avoid having throw rugs at the top or bottom of the stairs. If you do have throw rugs, attach them to the floor with carpet tape.  Make sure that you have a light switch at the top of the stairs and the bottom of the stairs. If you do not have them, ask someone to add them for you. What else can I do to help prevent falls?  Wear shoes that:  Do not have high heels.  Have rubber bottoms.  Are  comfortable and fit you well.  Are closed at the toe. Do not wear sandals.  If you use a stepladder:  Make sure that it is fully opened. Do not climb a closed stepladder.  Make sure that both sides of the stepladder are locked into place.  Ask someone to hold it for you, if possible.  Clearly mark and make sure that you can see:  Any grab bars or handrails.  First and last steps.  Where the edge of each step is.  Use tools that help you move around (mobility aids) if they are needed. These include:  Canes.  Walkers.  Scooters.  Crutches.  Turn on the lights when you go into a dark area. Replace any light bulbs as soon as they burn out.  Set up your furniture so you have a clear path. Avoid moving your furniture around.  If any of your floors are uneven, fix them.  If there are any pets around you, be aware of where they are.  Review your medicines with your doctor. Some medicines can make you feel dizzy. This can increase your chance of falling. Ask your doctor what other things that you can do to help prevent falls. This information is not intended to replace advice given to you by your health care provider. Make sure you discuss any questions you have with your health care provider. Document Released: 03/20/2009 Document Revised: 10/30/2015 Document Reviewed: 06/28/2014 Elsevier Interactive Patient Education  2017 [employer].

## 2019-03-27 NOTE — Telephone Encounter (Signed)
Virtual visit completed with patient today for AWV. Patient would like to request new rx for One Touch diabetic meter and testing supplies. She is currently buying OTC strips from [facility] and has used One Touch products in the past. Insurance may cover the cost of these supplies rather than OTC. Please send to [facility] on file with testing directions. Thank you!

## 2019-03-27 NOTE — Progress Notes (Signed)
Subjective:   Megan Short is a 73 y.o. female who presents for Medicare Annual (Subsequent) preventive examination.  Virtual Visit via Telephone Note  I connected with Megan Short on 03/27/19 at 10:00 AM EDT by telephone and verified that I am speaking with the correct person using two identifiers.  Medicare Annual Wellness visit completed telephonically due to Covid-19 pandemic.   Location: Patient: home Provider: office   I discussed the limitations, risks, security and privacy concerns of performing an evaluation and management service by telephone and the availability of in person appointments. The patient expressed understanding and agreed to proceed.  Some vital signs may be absent or patient reported.   Clemetine Marker, LPN    Review of Systems:   Cardiac Risk Factors include: advanced age (>5men, >52 women);diabetes mellitus;dyslipidemia;hypertension     Objective:     Vitals: BP 128/84   Ht 4\' 11"  (1.499 m)   Wt 143 lb (64.9 kg)   BMI 28.88 kg/m   Body mass index is 28.88 kg/m.  Advanced Directives 03/27/2019 12/14/2016 09/13/2016 05/13/2016 01/12/2016 09/10/2015 06/12/2015  Does Patient Have a Medical Advance Directive? Yes No No No No No No  Type of Paramedic of [place];Living will - - - - - -  Copy of [place] in Chart? No - copy requested - - - - - -  Would patient like information on creating a medical advance directive? - - - - No - patient declined information No - patient declined information -    Tobacco Social History   Tobacco Use  Smoking Status Never Smoker  Smokeless Tobacco Never Used     Counseling given: Not Answered   Clinical Intake:  Pre-visit preparation completed: Yes  Pain : No/denies pain     BMI - recorded: 28.88 Nutritional Status: BMI 25 -29 Overweight Nutritional Risks: None Diabetes: Yes CBG done?: No Did pt. bring in CBG monitor from home?: No   Nutrition Risk Assessment:   Has the patient had any N/V/D within the last 2 months?  No  Does the patient have any non-healing wounds?  No  Has the patient had any unintentional weight loss or weight gain?  No   Diabetes:  Is the patient diabetic?  Yes  If diabetic, was a CBG obtained today?  No  Did the patient bring in their glucometer from home?  No  How often do you monitor your CBG's? Daily fasting, today = 118.   Financial Strains and Diabetes Management:  Are you having any financial strains with the device, your supplies or your medication? No .  Does the patient want to be seen by Chronic Care Management for management of their diabetes?  No  Would the patient like to be referred to a Nutritionist or for Diabetic Management?  No   Diabetic Exams:  Diabetic Eye Exam: Completed 06/23/18.   Diabetic Foot Exam: Completed 03/24/18. Pt has been advised about the importance in completing this exam. Pt is scheduled for diabetic foot exam on 07/30/19.   How often do you need to have someone help you when you read instructions, pamphlets, or other written materials from your doctor or pharmacy?: 1 - Never  Interpreter Needed?: No  Information entered by :: Clemetine Marker LPN  Past Medical History:  Diagnosis Date  . Anxiety   . Depression   . Diabetes mellitus without complication ([place])   . Hyperlipidemia   . Hypertension   . Hypothyroidism  Past Surgical History:  Procedure Laterality Date  . BREAST BIOPSY Right    negative times 2  . COLONOSCOPY WITH PROPOFOL N/A 01/20/2015   Procedure: COLONOSCOPY WITH PROPOFOL;  Surgeon: Scot Jun, MD;  Location: [facility] ENDOSCOPY;  Service: Endoscopy;  Laterality: N/A;  . LITHOTRIPSY Left 12/2013   Family History  Problem Relation Age of Onset  . Osteoarthritis Mother   . Osteoarthritis Sister   . Alzheimer's disease Maternal Aunt    Social History   Socioeconomic History  . Marital status: Married    Spouse name: [name]   . Number of children: 1  .  Years of education: Not on file  . Highest education level: Some college, no degree  Occupational History  . Occupation: retired     Comment: Clinical biochemist rep   Social Needs  . Financial resource strain: Not hard at all  . Food insecurity    Worry: Never true    Inability: Never true  . Transportation needs    Medical: No    Non-medical: No  Tobacco Use  . Smoking status: Never Smoker  . Smokeless tobacco: Never Used  Substance and Sexual Activity  . Alcohol use: No    Alcohol/week: 0.0 standard drinks  . Drug use: No  . Sexual activity: Not Currently  Lifestyle  . Physical activity    Days per week: 3 days    Minutes per session: 60 min  . Stress: Not at all  Relationships  . Social connections    Talks on phone: More than three times a week    Gets together: Three times a week    Attends religious service: More than 4 times per year    Active member of club or organization: No    Attends meetings of clubs or organizations: Never    Relationship status: Married  Other Topics Concern  . Not on file  Social History Narrative   Married, they had one son that died with complications of lupus   Her mother lives with them.   occassionally substite teacher.     Outpatient Encounter Medications as of 03/27/2019  Medication Sig  . aspirin 81 MG tablet Take by mouth.  Marland Kitchen atorvastatin (LIPITOR) 80 MG tablet Take 1 tablet (80 mg total) by mouth every evening.  . citalopram (CELEXA) 40 MG tablet Take 1 tablet (40 mg total) by mouth daily.  . clonazePAM (KLONOPIN) 0.5 MG tablet Take 1 tablet (0.5 mg total) by mouth 2 (two) times daily as needed. (Patient taking differently: Take 0.5 mg by mouth 2 (two) times daily as needed. Rarely)  . levothyroxine (SYNTHROID) 75 MCG tablet TAKE 1 TABLET BY MOUTH ONCE DAILY BEFORE BREAKFAST  . losartan (COZAAR) 100 MG tablet Take 1 tablet by mouth once daily  . metFORMIN (GLUCOPHAGE-XR) 500 MG 24 hr tablet TAKE 2 TABLETS BY MOUTH ONCE DAILY  WITH BREAKFAST (Patient taking differently: Take 500 mg by mouth 2 (two) times daily. )  . [DISCONTINUED] Glucose Blood DISK BAYER BREEZE 2 TEST (In Vitro Disk)  1 Disk check fsbs daily for 90 days  Quantity: 10;  Refills: 1   Ordered :07-Apr-2012  Alba Cory MD;  Mora Appl 07-Apr-2012 Active  . [DISCONTINUED] Multiple Vitamins-Minerals (MULTIVITAMIN WOMEN 50+) TABS Take by mouth.   No facility-administered encounter medications on file as of 03/27/2019.     Activities of Daily Living In your present state of health, do you have any difficulty performing the following activities: 03/27/2019 01/26/2019  Hearing? N N  Comment pt declines hearing aids -  Vision? N N  Difficulty concentrating or making decisions? N N  Walking or climbing stairs? N N  Dressing or bathing? N N  Doing errands, shopping? N N  Preparing Food and eating ? N -  Using the Toilet? N -  In the past six months, have you accidently leaked urine? N -  Do you have problems with loss of bowel control? N -  Managing your Medications? N -  Managing your Finances? N -  Housekeeping or managing your Housekeeping? N -  Some recent data might be hidden    Patient Care Team: Alba Cory, MD as PCP - General (Family Medicine) Helane Gunther, DPM as Consulting Physician (Podiatry) Inis Sizer, MD as Consulting Physician (Dermatology)    Assessment:   This is a routine wellness examination for [place].  Exercise Activities and Dietary recommendations Current Exercise Habits: Home exercise routine, Type of exercise: walking, Time (Minutes): 60, Frequency (Times/Week): 3, Weekly Exercise (Minutes/Week): 180, Intensity: Mild, Exercise limited by: None identified  Goals    . DIET - EAT MORE FRUITS AND VEGETABLES    . Increase physical activity     At least one hour 4 times a day        Fall Risk Fall Risk  03/27/2019 01/26/2019 10/24/2018 06/27/2018 03/24/2018  Falls in the past year? 0 0 0 0 No  Number falls in  past yr: 0 0 0 - -  Injury with Fall? 0 0 0 - -  Comment - - - - -  Follow up Falls prevention discussed - - - -   FALL RISK PREVENTION PERTAINING TO THE HOME:  Any stairs in or around the home? Yes  If so, do they handrails? Yes   Home free of loose throw rugs in walkways, pet beds, electrical cords, etc? Yes  Adequate lighting in your home to reduce risk of falls? Yes   ASSISTIVE DEVICES UTILIZED TO PREVENT FALLS:  Life alert? No  Use of a cane, walker or w/c? No  Grab bars in the bathroom? Yes  Shower chair or bench in shower? Yes  Elevated toilet seat or a handicapped toilet? Yes   DME ORDERS:  DME order needed?  No   TIMED UP AND GO:  Was the test performed? No . Telephonic visit.   Education: Fall risk prevention has been discussed.  Intervention(s) required? No    Depression Screen PHQ 2/9 Scores 03/27/2019 01/26/2019 10/24/2018 06/27/2018  PHQ - 2 Score 0 0 0 0  PHQ- 9 Score - 0 0 0     Cognitive Function     6CIT Screen 03/27/2019 03/24/2018  What Year? 0 points 0 points  What month? 0 points 0 points  What time? 0 points 0 points  Count back from 20 0 points 0 points  Months in reverse 0 points 0 points  Repeat phrase 0 points 4 points  Total Score 0 4    Immunization History  Administered Date(s) Administered  . Fluad Quad(high Dose 65+) 03/07/2019  . Influenza Split 01/30/2014  . Influenza, High Dose Seasonal PF 02/12/2015, 05/13/2016, 03/23/2017, 03/21/2018  . Pneumococcal Conjugate-13 08/08/2014  . Pneumococcal Polysaccharide-23 05/27/2010, 09/10/2015  . Tdap 05/27/2010  . Zoster 05/31/2011    Qualifies for Shingles Vaccine? Yes  Zostavax completed 2012. Due for Shingrix. Education has been provided regarding the importance of this vaccine. Pt has been advised to call insurance company to determine out of pocket expense. Advised may also receive  vaccine at local pharmacy or Health Dept. Verbalized acceptance and understanding.  Tdap: Up to  date  Flu Vaccine: Up to date  Pneumococcal Vaccine: Up to date   Screening Tests Health Maintenance  Topic Date Due  . FOOT EXAM  03/25/2019  . OPHTHALMOLOGY EXAM  06/24/2019  . HEMOGLOBIN A1C  09/04/2019  . MAMMOGRAM  12/05/2019  . TETANUS/TDAP  05/27/2020  . COLONOSCOPY  01/19/2025  . INFLUENZA VACCINE  Completed  . DEXA SCAN  Completed  . Hepatitis C Screening  Completed  . PNA vac Low Risk Adult  Completed    Cancer Screenings:  Colorectal Screening: Completed 01/20/15. Repeat every 10 years  Mammogram: Completed 12/05/18. Repeat every year.  Bone Density: Completed 2015. Results reflect NORMAL. Repeat every 2 years. Ordered today. Pt provided with contact information and advised to call to schedule appt.   Lung Cancer Screening: (Low Dose CT Chest recommended if Age 82-80 years, 30 pack-year currently smoking OR have quit w/in 15years.) does not qualify.    Additional Screening:  Hepatitis C Screening: does qualify; Completed 03/07/12  Vision Screening: Recommended annual ophthalmology exams for early detection of glaucoma and other disorders of the eye. Is the patient up to date with their annual eye exam?  Yes  Who is the provider or what is the name of the office in which the pt attends annual eye exams? [facility]  Dental Screening: Recommended annual dental exams for proper oral hygiene  Community Resource Referral:  CRR required this visit?  No      Plan:    I have personally reviewed and addressed the Medicare Annual Wellness questionnaire and have noted the following in the patient's chart:  A. Medical and social history B. Use of alcohol, tobacco or illicit drugs  C. Current medications and supplements D. Functional ability and status E.  Nutritional status F.  Physical activity G. Advance directives H. List of other physicians I.  Hospitalizations, surgeries, and ER visits in previous 12 months J.  Vitals K. Screenings such as hearing  and vision if needed, cognitive and depression L. Referrals and appointments   In addition, I have reviewed and discussed with patient certain preventive protocols, quality metrics, and best practice recommendations. A written personalized care plan for preventive services as well as general preventive health recommendations were provided to patient.   Signed,  Reather LittlerKasey Amando Ishikawa, LPN Nurse Health Advisor   Nurse Notes: telephone note sent to request rx for new diabetic meter and testing supplies due to she is currently buying OTC strips and insurance may help with the cost of these items. She is otherwise doing well and appreciative of visit today.

## 2019-03-28 MED ORDER — BLOOD GLUCOSE METER KIT: PACK | 0 refills | Status: DC

## 2019-03-28 NOTE — Addendum Note (Signed)
Addended by: [name] F on: 03/28/2019 09:18 AM   Modules accepted: Orders

## 2019-03-28 NOTE — Addendum Note (Signed)
Addended by: Inda Coke on: 03/28/2019 08:23 AM   Modules accepted: Orders

## 2019-04-02 ENCOUNTER — Telehealth: Payer: Self-pay | Admitting: Family Medicine

## 2019-04-02 DIAGNOSIS — E1129 Type 2 diabetes mellitus with other diabetic kidney complication: Secondary | ICD-10-CM

## 2019-04-02 DIAGNOSIS — R809 Proteinuria, unspecified: Secondary | ICD-10-CM

## 2019-04-02 MED ORDER — BLOOD GLUCOSE METER KIT: PACK | 0 refills | Status: AC

## 2019-04-02 NOTE — Telephone Encounter (Signed)
Robin from [employer] calling re: blood glucose meter kit and supplies States that Medicare will not allow for any variance "Up to 4 times a day" - RX instructions must have 4 times a day written (can not say "up to"). Please refax.

## 2019-04-24 ENCOUNTER — Other Ambulatory Visit: Payer: Self-pay | Admitting: Family Medicine

## 2019-04-24 DIAGNOSIS — E038 Other specified hypothyroidism: Secondary | ICD-10-CM

## 2019-05-28 ENCOUNTER — Encounter: Payer: Self-pay | Admitting: Family Medicine

## 2019-07-03 ENCOUNTER — Other Ambulatory Visit: Payer: Self-pay | Admitting: Family Medicine

## 2019-07-03 DIAGNOSIS — E1129 Type 2 diabetes mellitus with other diabetic kidney complication: Secondary | ICD-10-CM

## 2019-07-03 NOTE — Telephone Encounter (Signed)
Pt has appt 07/30/19

## 2019-07-27 DIAGNOSIS — E119 Type 2 diabetes mellitus without complications: Secondary | ICD-10-CM | POA: Diagnosis not present

## 2019-07-27 LAB — HM DIABETES EYE EXAM

## 2019-07-30 ENCOUNTER — Encounter: Payer: Self-pay | Admitting: Family Medicine

## 2019-07-30 ENCOUNTER — Other Ambulatory Visit: Payer: Self-pay

## 2019-07-30 ENCOUNTER — Ambulatory Visit (INDEPENDENT_AMBULATORY_CARE_PROVIDER_SITE_OTHER): Payer: Medicare Other | Admitting: Family Medicine

## 2019-07-30 VITALS — BP 120/68 | HR 86 | Temp 97.8°F | Resp 16 | Ht 59.0 in | Wt 147.9 lb

## 2019-07-30 DIAGNOSIS — E1129 Type 2 diabetes mellitus with other diabetic kidney complication: Secondary | ICD-10-CM | POA: Diagnosis not present

## 2019-07-30 DIAGNOSIS — M65331 Trigger finger, right middle finger: Secondary | ICD-10-CM

## 2019-07-30 DIAGNOSIS — E785 Hyperlipidemia, unspecified: Secondary | ICD-10-CM | POA: Diagnosis not present

## 2019-07-30 DIAGNOSIS — E039 Hypothyroidism, unspecified: Secondary | ICD-10-CM

## 2019-07-30 DIAGNOSIS — Z780 Asymptomatic menopausal state: Secondary | ICD-10-CM | POA: Diagnosis not present

## 2019-07-30 DIAGNOSIS — F325 Major depressive disorder, single episode, in full remission: Secondary | ICD-10-CM

## 2019-07-30 DIAGNOSIS — R809 Proteinuria, unspecified: Secondary | ICD-10-CM | POA: Diagnosis not present

## 2019-07-30 DIAGNOSIS — R232 Flushing: Secondary | ICD-10-CM | POA: Diagnosis not present

## 2019-07-30 DIAGNOSIS — I10 Essential (primary) hypertension: Secondary | ICD-10-CM | POA: Diagnosis not present

## 2019-07-30 DIAGNOSIS — R2232 Localized swelling, mass and lump, left upper limb: Secondary | ICD-10-CM | POA: Diagnosis not present

## 2019-07-30 DIAGNOSIS — E038 Other specified hypothyroidism: Secondary | ICD-10-CM

## 2019-07-30 LAB — POCT GLYCOSYLATED HEMOGLOBIN (HGB A1C): HbA1c, POC (controlled diabetic range): 6.5 % (ref 0.0–7.0)

## 2019-07-30 MED ORDER — ATORVASTATIN CALCIUM 80 MG PO TABS: 80.0000 mg | ORAL_TABLET | Freq: Every evening | ORAL | 1 refills | Status: DC

## 2019-07-30 MED ORDER — CITALOPRAM HYDROBROMIDE 40 MG PO TABS: 40.0000 mg | ORAL_TABLET | Freq: Every day | ORAL | 1 refills | Status: DC

## 2019-07-30 MED ORDER — METFORMIN HCL ER 500 MG PO TB24: 500.0000 mg | ORAL_TABLET | Freq: Two times a day (BID) | ORAL | 1 refills | Status: DC

## 2019-07-30 MED ORDER — LOSARTAN POTASSIUM 100 MG PO TABS: 100.0000 mg | ORAL_TABLET | Freq: Every day | ORAL | 1 refills | Status: DC

## 2019-07-30 MED ORDER — LEVOTHYROXINE SODIUM 75 MCG PO TABS: 75.0000 ug | ORAL_TABLET | Freq: Every day | ORAL | 1 refills | Status: DC

## 2019-07-30 NOTE — Progress Notes (Signed)
Name: Megan Short   MRN: [id number]    DOB: July 09, 1945   Date:07/30/2019       Progress Note  Subjective  Chief Complaint  Chief Complaint  Patient presents with  . Medication Refill    6 month F/U  . Dyslipidemia  . Diabetes    Checks every morning-pre-prandial around 100's  . Hypertension    Denies any symptoms  . Hypothyroidism  . Depression  . Trigger Finger    Onset-3 months, right hand has a trigger finger and also a knot on her pointer finger she would like looked at.    HPI  HTN: taking medication daily and denies side effects. No chest pain, no palpitation, or orthostatic changes. Only on Losartan at this time. 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 BP today is at goal.   Hypothyroidism: denies fatigue,dry skin, dysphagiaor constipation She is taking Levothyroxine as prescribed last TSH at goal.   DMII: She is trying to walk outside at least 3 times week for about 3 miles, HgbA1C has been well controlled at 6.4%  She denies polyphagia, polydipsia, or polyuria.  She is taking ARB and last urine micro has improved down from 100but last time it was normal.Seen byNephrologist - Dr. Candiss Norse - in the pas but has been released. Hyperparathyroidism resolved   Major Depression: it was caused by death of her son 94WHQPR ago( complications of lupus died at age 69), she is doing well on Celexa and BZD's very seldom. She denies anhedonia, or crying spells, still has difficulty making decisions ( like picking out an outfit) but not about finances. 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 She is doing well at this time.  Dyslipidemia: on Atorvastatin,denies myalgias, doing well on medication. Last LDL was a little higher than usual at 76, used to be 58   Vitamin D deficiency: last labs were at goal  Patient Active Problem List   Diagnosis Date Noted  . Umbilical hernia without obstruction and without gangrene 09/13/2016  . Diabetes mellitus with renal  manifestations, controlled ([place]) 09/07/2014  . Adult hypothyroidism 09/07/2014  . Vitamin D deficiency 09/07/2014  . Benign essential HTN 09/07/2014  . Microalbuminuria 09/07/2014  . Dyslipidemia 09/07/2014  . Major depression in full remission ([place]) 09/07/2014  . 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/07/2014  . Calculus of kidney 09/07/2014  . Memory loss or impairment 09/07/2014    Past Surgical History:  Procedure Laterality Date  . BREAST BIOPSY Right    negative times 2  . COLONOSCOPY WITH PROPOFOL N/A 01/20/2015   Procedure: COLONOSCOPY WITH PROPOFOL;  Surgeon: Manya Silvas, MD;  Location: [facility] ENDOSCOPY;  Service: Endoscopy;  Laterality: N/A;  . LITHOTRIPSY Left 12/2013    Family History  Problem Relation Age of Onset  . Osteoarthritis Mother   . Osteoarthritis Sister   . Alzheimer's disease Maternal Aunt     Social History   Tobacco Use  . Smoking status: Never Smoker  . Smokeless tobacco: Never Used  Substance Use Topics  . Alcohol use: No    Alcohol/week: 0.0 standard drinks  . Drug use: No     Current Outpatient Medications:  .  aspirin 81 MG tablet, Take by mouth., Disp: , Rfl:  .  atorvastatin (LIPITOR) 80 MG tablet, Take 1 tablet (80 mg total) by mouth every evening., Disp: 90 tablet, Rfl: 1 .  blood glucose meter kit and supplies, Dispense based on patient and insurance preference. Check bid daily  as directed. (FOR ICD-10 E10.9, E11.9)., Disp: 1 each, Rfl: 0 .  citalopram (CELEXA) 40 MG tablet, Take 1 tablet (40 mg total) by mouth daily., Disp: 90 tablet, Rfl: 1 .  clonazePAM (KLONOPIN) 0.5 MG tablet, Take 1 tablet (0.5 mg total) by mouth 2 (two) times daily as needed. (Patient taking differently: Take 0.5 mg by mouth 2 (two) times daily as needed. Rarely), Disp: 30 tablet, Rfl: 0 .  levothyroxine (SYNTHROID) 75 MCG tablet, Take 1 tablet (75 mcg total) by mouth daily before breakfast., Disp: 90 tablet, Rfl: 1 .  losartan (COZAAR) 100 MG  tablet, Take 1 tablet (100 mg total) by mouth daily., Disp: 90 tablet, Rfl: 1 .  metFORMIN (GLUCOPHAGE-XR) 500 MG 24 hr tablet, Take 1 tablet (500 mg total) by mouth 2 (two) times daily., Disp: 180 tablet, Rfl: 1  Allergies  Allergen Reactions  . Sulfa Antibiotics     I personally reviewed active problem list, medication list, allergies, family history, social history, health maintenance with the patient/caregiver today.   ROS  Constitutional: Negative for fever or weight change.  Respiratory: Negative for cough and shortness of breath.   Cardiovascular: Negative for chest pain or palpitations.  Gastrointestinal: Negative for abdominal pain, no bowel changes.  Musculoskeletal: Negative for gait problem or joint swelling.  Skin: Negative for rash.  Neurological: Negative for dizziness or headache.  No other specific complaints in a complete review of systems (except as listed in HPI above).  Objective  Vitals:   07/30/19 0904  BP: 120/68  Pulse: 86  Resp: 16  Temp: 97.8 F (36.6 C)  TempSrc: Temporal  SpO2: 93%  Weight: 147 lb 14.4 oz (67.1 kg)  Height: _0  (1.499 m)    Body mass index is 29.87 kg/m.  Physical Exam  Constitutional: Patient appears well-developed and well-nourished. Obese  No distress.  HEENT: head atraumatic, normocephalic, pupils equal and reactive to light Cardiovascular: Normal rate, regular rhythm and normal heart sounds.  No murmur heard. No BLE edema. Pulmonary/Chest: Effort normal and breath sounds normal. No respiratory distress. Abdominal: Soft.  There is no tenderness. Psychiatric: Patient has a normal mood and affect. behavior is normal. Judgment and thought content normal.  Recent Results (from the past 2160 hour(s))  POCT HgB A1C     Status: None   Collection Time: 07/30/19  9:44 AM  Result Value Ref Range   Hemoglobin A1C     HbA1c POC (<> result, manual entry)     HbA1c, POC (prediabetic range)     HbA1c, POC (controlled  diabetic range) 6.5 0.0 - 7.0 %    Diabetic Foot Exam: Diabetic Foot Exam - Simple   Simple Foot Form Visual Inspection See comments: Yes Sensation Testing Intact to touch and monofilament testing bilaterally: Yes Pulse Check Posterior Tibialis and Dorsalis pulse intact bilaterally: Yes Comments Thick toenails      PHQ2/9: Depression screen [facility] 2/9 07/30/2019 03/27/2019 01/26/2019 10/24/2018 06/27/2018  Decreased Interest 0 0 0 0 0  Down, Depressed, Hopeless 0 0 0 0 0  PHQ - 2 Score 0 0 0 0 0  Altered sleeping 0 - 0 0 0  Tired, decreased energy 0 - 0 0 0  Change in appetite 0 - 0 0 0  Feeling bad or failure about yourself  0 - 0 0 0  Trouble concentrating 0 - 0 0 0  Moving slowly or fidgety/restless 0 - 0 0 0  Suicidal thoughts 0 - 0 0 0  PHQ-9 Score  0 - 0 0 0  Difficult doing work/chores Not difficult at all - - Not difficult at all Not difficult at all  Some recent data might be hidden    phq 9 is negative   Fall Risk: Fall Risk  07/30/2019 03/27/2019 01/26/2019 10/24/2018 06/27/2018  Falls in the past year? 0 0 0 0 0  Number falls in past yr: 0 0 0 0 -  Injury with Fall? 0 0 0 0 -  Comment - - - - -  Follow up - Falls prevention discussed - - -      Functional Status Survey: Is the patient deaf or have difficulty hearing?: No Does the patient have difficulty seeing, even when wearing glasses/contacts?: Yes Does the patient have difficulty concentrating, remembering, or making decisions?: No Does the patient have difficulty walking or climbing stairs?: No Does the patient have difficulty dressing or bathing?: No Does the patient have difficulty doing errands alone such as visiting a doctor's office or shopping?: No    Assessment & Plan   1. Controlled type 2 diabetes mellitus with microalbuminuria, without long-term current use of insulin (HCC)  - POCT HgB A1C - losartan (COZAAR) 100 MG tablet; Take 1 tablet (100 mg total) by mouth daily.  Dispense: 90  tablet; Refill: 1 - metFORMIN (GLUCOPHAGE-XR) 500 MG 24 hr tablet; Take 1 tablet (500 mg total) by mouth 2 (two) times daily.  Dispense: 180 tablet; Refill: 1  1. Controlled type 2 diabetes mellitus with microalbuminuria, without long-term current use of insulin (HCC)  - POCT HgB A1C - losartan (COZAAR) 100 MG tablet; Take 1 tablet (100 mg total) by mouth daily.  Dispense: 90 tablet; Refill: 1 - metFORMIN (GLUCOPHAGE-XR) 500 MG 24 hr tablet; Take 1 tablet (500 mg total) by mouth 2 (two) times daily.  Dispense: 180 tablet; Refill: 1  2. Nodule of finger of left hand  - Ambulatory referral to Orthopedic Surgery  3. Trigger finger, right middle finger  - Ambulatory referral to Orthopedic Surgery  4. Hot flashes   5. Essential hypertension  - losartan (COZAAR) 100 MG tablet; Take 1 tablet (100 mg total) by mouth daily.  Dispense: 90 tablet; Refill: 1  6. Adult hypothyroidism   7. Postmenopausal estrogen deficiency  reassurance  8. Dyslipidemia  - atorvastatin (LIPITOR) 80 MG tablet; Take 1 tablet (80 mg total) by mouth every evening.  Dispense: 90 tablet; Refill: 1  9. Major depression in remission ([place])  - citalopram (CELEXA) 40 MG tablet; Take 1 tablet (40 mg total) by mouth daily.  Dispense: 90 tablet; Refill: 1 dism

## 2019-08-10 DIAGNOSIS — M65331 Trigger finger, right middle finger: Secondary | ICD-10-CM | POA: Diagnosis not present

## 2019-09-08 ENCOUNTER — Other Ambulatory Visit: Payer: Self-pay | Admitting: Family Medicine

## 2019-09-13 ENCOUNTER — Telehealth: Payer: Self-pay | Admitting: Family Medicine

## 2019-09-13 DIAGNOSIS — R809 Proteinuria, unspecified: Secondary | ICD-10-CM

## 2019-09-13 DIAGNOSIS — E1129 Type 2 diabetes mellitus with other diabetic kidney complication: Secondary | ICD-10-CM

## 2019-09-13 NOTE — Telephone Encounter (Signed)
Copied from CRM [id number]. Topic: General - Other >> Sep 13, 2019 10:52 AM Tamela Oddi wrote: Reason for CRM: Called to ask the doctor for a new script for ACCU-CHEK GUIDE test strip.  Stated that the diagnosis code was left off.  Please advise and call at [phone]

## 2019-09-14 MED ORDER — ACCU-CHEK GUIDE VI STRP: ORAL_STRIP | 5 refills | Status: DC

## 2019-09-17 ENCOUNTER — Other Ambulatory Visit: Payer: Self-pay

## 2019-09-17 MED ORDER — ACCU-CHEK GUIDE VI STRP: ORAL_STRIP | 5 refills | Status: DC

## 2019-09-26 NOTE — Telephone Encounter (Signed)
As per pharmacist Azalia Bilis is requesting additional information regarding why patient is testing herself more then once a day and not on insulin. Pharmacist faxed over letter reflecting this to [phone]. Patient would like a follow up call

## 2019-09-27 MED ORDER — ACCU-CHEK GUIDE VI STRP: ORAL_STRIP | 5 refills | Status: DC

## 2019-10-03 ENCOUNTER — Telehealth: Payer: Self-pay | Admitting: Family Medicine

## 2019-10-03 NOTE — Chronic Care Management (AMB) (Signed)
  Chronic Care Management   Note  10/03/2019 Name: Kimiyo Carmicheal MRN: [id number] DOB: 08/28/1945  Hazelynn Mckenny is a 74 y.o. year old female who is a primary care patient of [name], MD. I reached out to [name] by phone today in response to a referral sent by Ms. Hassan Rowan Custard's health plan.     Ms. Pieri was given information about Chronic Care Management services today including:  1. CCM service includes personalized support from designated clinical staff supervised by her physician, including individualized plan of care and coordination with other care providers 2. 24/7 contact phone numbers for assistance for urgent and routine care needs. 3. Service will only be billed when office clinical staff spend 20 minutes or more in a month to coordinate care. 4. Only one practitioner may furnish and bill the service in a calendar month. 5. The patient may stop CCM services at any time (effective at the end of the month) by phone call to the office staff. 6. The patient will be responsible for cost sharing (co-pay) of up to 20% of the service fee (after annual deductible is met).  Patient agreed to services and verbal consent obtained.   Follow up plan: Telephone appointment with care management team member scheduled for: 10/09/2019.  [place], [place] [address] Direct Dial: [phone] [email or web address]: [email or web address]

## 2019-10-09 ENCOUNTER — Other Ambulatory Visit: Payer: Self-pay

## 2019-10-09 ENCOUNTER — Ambulatory Visit: Payer: Medicare Other | Admitting: Pharmacist

## 2019-10-09 DIAGNOSIS — E039 Hypothyroidism, unspecified: Secondary | ICD-10-CM

## 2019-10-09 DIAGNOSIS — I10 Essential (primary) hypertension: Secondary | ICD-10-CM

## 2019-10-09 NOTE — Chronic Care Management (AMB) (Signed)
Chronic Care Management Pharmacy  Name: Megan Short  MRN: [id number] DOB: [birth year]  Chief Complaint/ HPI  [name],  74 y.o. , female presents for their Initial CCM visit with the clinical pharmacist via telephone due to COVID-19 Pandemic.  PCP : [name], MD  Their chronic conditions include: hypothyroidism, DM, depression, HLD, HTN  Office Visits: 2/22 DM, [place], BP 120/68 P 86, Ht 59" Wt 147 BMI 29.87 A1c 6.4%, walking,  8/31 HLD, [place], FSBG 120s, no longer sees nephrology,  Consult Visit: NA  Medications: Outpatient Encounter Medications as of 10/09/2019  Medication Sig Note  . aspirin 81 MG tablet Take by mouth. 09/07/2014: Received from: [facility]  . atorvastatin (LIPITOR) 80 MG tablet Take 1 tablet (80 mg total) by mouth every evening.   . blood glucose meter kit and supplies Dispense based on patient and insurance preference. Check bid daily as directed. (FOR ICD-10 E10.9, E11.9).   . citalopram (CELEXA) 40 MG tablet Take 1 tablet (40 mg total) by mouth daily.   . clonazePAM (KLONOPIN) 0.5 MG tablet Take 1 tablet (0.5 mg total) by mouth 2 (two) times daily as needed. (Patient taking differently: Take 0.5 mg by mouth 2 (two) times daily as needed. Rarely)   . glucose blood (ACCU-CHEK GUIDE) test strip USE TO CHECK BLOOD SUGAR TWICE DAILY AS DIRECTED   . glucose blood (ACCU-CHEK GUIDE) test strip USE TO CHECK BLOOD SUGAR DAILY AS DIRECTED  DX CODE:Controlled type 2 diabetes mellitus with microalbuminuria, without long-term current use of insulin (HCC)  E11.29, R80.9   . levothyroxine (SYNTHROID) 75 MCG tablet Take 1 tablet (75 mcg total) by mouth daily before breakfast.   . losartan (COZAAR) 100 MG tablet Take 1 tablet (100 mg total) by mouth daily.   . metFORMIN (GLUCOPHAGE-XR) 500 MG 24 hr tablet Take 1 tablet (500 mg total) by mouth 2 (two) times daily.    No facility-administered encounter medications on file as of 10/09/2019.     Physical  Activity: Sufficiently Active  . Days of Exercise per Week: 4 days  . Minutes of Exercise per Session: 40 min    Current Diagnosis/Assessment:  Goals Addressed   None    Diabetes   Recent Relevant Labs: Lab Results  Component Value Date/Time   HGBA1C 6.5 07/30/2019 09:44 AM   HGBA1C 6.4 (H) 03/07/2019 12:00 AM   HGBA1C 6.2 10/24/2018 09:22 AM   HGBA1C 6.2 11/17/2017 09:10 AM   HGBA1C 6.8 09/04/2015 10:23 AM   MICROALBUR 1.4 03/07/2019 12:00 AM   MICROALBUR 5.6 03/21/2018 11:10 AM   MICROALBUR 50 06/12/2015 10:18 AM     Checking BG: Daily  Recent FBG Readings: 120s will check other times Patient has failed these meds in past: glipizide  Patient is currently controlled on the following medications: metformin  Lab Results  Component Value Date/Time   HMDIABEYEEXA No Retinopathy 07/27/2019 12:00 AM    Last diabetic Eye exam: No results found for: HMDIABFOOTEX  GFR: 59 ml/min We discussed: metformin twice daily, now takes all in morning Vitamin D3 1000iu daily - no dairy Bowels regular recently, 32 oz water per day Walk 6 - 9 miles each week track at gym  Sweating at rest - asked to do FSBG, no hx HRT, goes to fan for relief, 2 - 3 times daily  Husband smokes in house pack a day, sometimes outside (encouraged)  Small scratches - 8 years, using special soap  Plan   Continue current medications  Start Claritin  for mystery marks on skin Increase dairy in diet such as yogurt Ask husband to smoke outside  Hyperlipidemia   Lipid Panel     Component Value Date/Time   CHOL 123 03/07/2019 0000   TRIG 89 03/07/2019 0000   HDL 30 (L) 03/07/2019 0000   CHOLHDL 4.1 03/07/2019 0000   VLDL 13 05/13/2016 0941   LDLCALC 76 03/07/2019 0000     The ASCVD Risk score (Goff DC Jr., et al., 2013) failed to calculate for the following reasons:   The valid total cholesterol range is 130 to 320 mg/dL   Patient has failed these meds in past: Crestor    Patient is currently  controlled on the following medications: Lipitor  Technically LDL currently not less that 70, but usually is.   We discussed:  denies myalgias with statin  Plan  Continue current medications   Depression   Patient has failed these meds in past: NA Patient is currently controlled on the following medications: Celexa, Klonopin  We discussed:  Uses Klonopin 0.39m as needed, filled 01/26/20 still has some left from 30.  Gets lift from walking  Plan  Patient to walk more frequently if possible Continue current medications   Hypertension   Office blood pressures are  BP Readings from Last 3 Encounters:  07/30/19 120/68  03/27/19 128/84  10/24/18 118/64    Patient has failed these meds in the past: NA  Patient checks BP at home daily  Patient home BP readings are ranging: 120/80 at home,  We discussed losartan  Plan  Continue current medications     Hypothyroidism   TSH  Date Value Ref Range Status  03/07/2019 0.68 0.40 - 4.50 mIU/L Final     Patient is currently controlled on the following medications: levothyroxine  We discussed:  TSH running consistently low  Plan  Consider dose decrease to levothyroxine 562m daily  TeMilus HeightPharmD, BCCrugersCTSierra [facility] [phone]

## 2019-10-10 NOTE — Patient Instructions (Signed)
Visit Information  Goals Addressed            This Visit's Progress    Diabetes Type 3 - goal A1c < 7%       CARE PLAN ENTRY (see longitudinal plan of care for additional care plan information)  Current Barriers:   Diabetes: mellitus type 2; complicated by chronic medical conditions including HTN, HTN, second hand smoke Lab Results  Component Value Date   HGBA1C 6.5 07/30/2019    Lab Results  Component Value Date   CREATININE 1.09 (H) 03/07/2019   CREATININE 1.03 (H) 03/21/2018   CREATININE 1.07 (H) 07/19/2017    Current antihyperglycemic regimen: metformin 1000mg  every morning  Reports hypoglycemic symptoms, including shaking, sweating  Denies hyperglycemic symptoms, including polyuria, polydipsia, polyphagia, nocturia, blurred vision, neuropathy  Current exercise: moderate walking  Current blood glucose readings: 120 range  Pharmacist Clinical Goal(s):   Over the next 30 days, patient will work with PharmD and primary care provider to address sweating of unknown etiology, potentially hypglycemia  Interventions:  Comprehensive medication review performed, medication list updated in electronic medical record  Inter-disciplinary care team collaboration (see longitudinal plan of care)  Begin taking metformin as prescribed twice daily due to possible hypoglycemia  Patient Self Care Activities:   Patient will check blood glucose when sweating , document, and provide at future appointments  Patient will focus on medication adherence by taking metformin as directed  Patient will contact provider with any episodes of hypoglycemia  Patient will report any questions or concerns to provider   Initial goal documentation      Hyperlipidemia - goal LDL < 70       CARE PLAN ENTRY (see longitudinal plan of care for additional care plan information)  Current Barriers:   Uncontrolled hyperlipidemia, complicated by DM, HTN  Current antihyperlipidemic regimen:  Lipitor 80mg  daily  Previous antihyperlipidemic medications tried Crestor  Most recent lipid panel:     Component Value Date/Time   CHOL 123 03/07/2019 0000   TRIG 89 03/07/2019 0000   HDL 30 (L) 03/07/2019 0000   CHOLHDL 4.1 03/07/2019 0000   VLDL 13 05/13/2016 0941   LDLCALC 76 03/07/2019 0000     ASCVD risk enhancing conditions: age >2, DM, HTN, CKD, CHF, current smoker second hand  Pharmacist Clinical Goal(s):   Over the next 90 days, patient will work with PharmD and providers towards optimized antihyperlipidemic therapy.  Patient typically at goal and willing to return to LDL < 70  Interventions:  Comprehensive medication review performed; medication list updated in electronic medical record.   Inter-disciplinary care team collaboration (see longitudinal plan of care)  Reduce exposure to second hand smoke since smoking does increase LDL  Patient Self Care Activities:   Patient will focus on medication adherence by trying to get husband to smoke outside.  Initial goal documentation      Hypertension - goal BP < 140/90       CARE PLAN ENTRY (see longitudinal plan of care for additional care plan information)  Current Barriers:   Controlled hypertension, complicated by DM, HLD  Current antihypertensive regimen: losartan 100mg  daily  Previous antihypertensives tried: NA  Last practice recorded BP readings:  BP Readings from Last 3 Encounters:  07/30/19 120/68  03/27/19 128/84  10/24/18 118/64     Current home BP readings: near 120/80  Pharmacist Clinical Goal(s):   Over the next 90 days, patient will work with PharmD and providers to continue optimized antihypertensive regimen  Interventions:  Inter-disciplinary care team collaboration (see longitudinal plan of care)  Comprehensive medication review performed; medication list updated in the electronic medical record.   Counseled on need for ARB, for kidney protection, even when BP is  normal  Patient Self Care Activities:   Patient will continue to check BP daily , document, and provide at future appointments  Patient will focus on medication adherence by taking losartan even when BP seems normal  Initial goal documentation        Ms. Leone was given information about Chronic Care Management services today including:  1. CCM service includes personalized support from designated clinical staff supervised by her physician, including individualized plan of care and coordination with other care providers 2. 24/7 contact phone numbers for assistance for urgent and routine care needs. 3. Standard insurance, coinsurance, copays and deductibles apply for chronic care management only during months in which we provide at least 20 minutes of these services. Most insurances cover these services at 100%, however patients may be responsible for any copay, coinsurance and/or deductible if applicable. This service may help you avoid the need for more expensive face-to-face services. 4. Only one practitioner may furnish and bill the service in a calendar month. 5. The patient may stop CCM services at any time (effective at the end of the month) by phone call to the office staff.  Patient agreed to services and verbal consent obtained.   Print copy of patient instructions provided.  Telephone follow up appointment with pharmacy team member scheduled for: 1 month  Felton Clinton, PharmD, [place], CTTS Clinical Pharmacist [facility] [phone]  Hypothyroidism  Hypothyroidism is when the thyroid gland does not make enough of certain hormones (it is underactive). The thyroid gland is a small gland located in the lower front part of the neck, just in front of the windpipe (trachea). This gland makes hormones that help control how the body uses food for energy (metabolism) as well as how the heart and brain function. These hormones also play a role in keeping your bones strong.  When the thyroid is underactive, it produces too little of the hormones thyroxine (T4) and triiodothyronine (T3). What are the causes? This condition may be caused by:  Hashimoto's disease. This is a disease in which the body's disease-fighting system (immune system) attacks the thyroid gland. This is the most common cause.  Viral infections.  Pregnancy.  Certain medicines.  Birth defects.  Past radiation treatments to the head or neck for cancer.  Past treatment with radioactive iodine.  Past exposure to radiation in the environment.  Past surgical removal of part or all of the thyroid.  Problems with a gland in the center of the brain (pituitary gland).  Lack of enough iodine in the diet. What increases the risk? You are more likely to develop this condition if:  You are female.  You have a family history of thyroid conditions.  You use a medicine called lithium.  You take medicines that affect the immune system (immunosuppressants). What are the signs or symptoms? Symptoms of this condition include:  Feeling as though you have no energy (lethargy).  Not being able to tolerate cold.  Weight gain that is not explained by a change in diet or exercise habits.  Lack of appetite.  Dry skin.  Coarse hair.  Menstrual irregularity.  Slowing of thought processes.  Constipation.  Sadness or depression. How is this diagnosed? This condition may be diagnosed based on:  Your symptoms, your medical history, and  a physical exam.  Blood tests. You may also have imaging tests, such as an ultrasound or MRI. How is this treated? This condition is treated with medicine that replaces the thyroid hormones that your body does not make. After you begin treatment, it may take several weeks for symptoms to go away. Follow these instructions at home:  Take over-the-counter and prescription medicines only as told by your health care provider.  If you start taking any new  medicines, tell your health care provider.  Keep all follow-up visits as told by your health care provider. This is important. ? As your condition improves, your dosage of thyroid hormone medicine may change. ? You will need to have blood tests regularly so that your health care provider can monitor your condition. Contact a health care provider if:  Your symptoms do not get better with treatment.  You are taking thyroid replacement medicine and you: ? Sweat a lot. ? Have tremors. ? Feel anxious. ? Lose weight rapidly. ? Cannot tolerate heat. ? Have emotional swings. ? Have diarrhea. ? Feel weak. Get help right away if you have:  Chest pain.  An irregular heartbeat.  A rapid heartbeat.  Difficulty breathing. Summary  Hypothyroidism is when the thyroid gland does not make enough of certain hormones (it is underactive).  When the thyroid is underactive, it produces too little of the hormones thyroxine (T4) and triiodothyronine (T3).  The most common cause is Hashimoto's disease, a disease in which the body's disease-fighting system (immune system) attacks the thyroid gland. The condition can also be caused by viral infections, medicine, pregnancy, or past radiation treatment to the head or neck.  Symptoms may include weight gain, dry skin, constipation, feeling as though you do not have energy, and not being able to tolerate cold.  This condition is treated with medicine to replace the thyroid hormones that your body does not make. This information is not intended to replace advice given to you by your health care provider. Make sure you discuss any questions you have with your health care provider. Document Revised: 05/06/2017 Document Reviewed: 05/04/2017 Elsevier Patient Education  2020 [employer].

## 2019-10-24 ENCOUNTER — Other Ambulatory Visit: Payer: Self-pay | Admitting: Family Medicine

## 2019-10-24 DIAGNOSIS — Z1231 Encounter for screening mammogram for malignant neoplasm of breast: Secondary | ICD-10-CM

## 2019-11-06 ENCOUNTER — Ambulatory Visit: Payer: Medicare Other | Admitting: Pharmacist

## 2019-11-06 ENCOUNTER — Other Ambulatory Visit: Payer: Self-pay

## 2019-11-06 DIAGNOSIS — R809 Proteinuria, unspecified: Secondary | ICD-10-CM

## 2019-11-06 DIAGNOSIS — I10 Essential (primary) hypertension: Secondary | ICD-10-CM

## 2019-11-06 NOTE — Patient Instructions (Addendum)
Visit Information  Goals Addressed   None     Print copy of patient instructions provided.   Telephone follow up appointment with pharmacy team member scheduled for: 3 months  Felton Clinton, PharmD, [place], CTTS Clinical Pharmacist [facility] [phone]  Mindfulness-Based Stress Reduction Mindfulness-based stress reduction (MBSR) is a program that helps people learn to practice mindfulness. Mindfulness is the practice of intentionally paying attention to the present moment. It can be learned and practiced through techniques such as education, breathing exercises, meditation, and yoga. MBSR includes several mindfulness techniques in one program. MBSR works best when you understand the treatment, are willing to try new things, and can commit to spending time practicing what you learn. MBSR training may include learning about:  How your emotions, thoughts, and reactions affect your body.  New ways to respond to things that cause negative thoughts to start (triggers).  How to notice your thoughts and let go of them.  Practicing awareness of everyday things that you normally do without thinking.  The techniques and goals of different types of meditation. What are the benefits of MBSR? MBSR can have many benefits, which include helping you to:  Develop self-awareness. This refers to knowing and understanding yourself.  Learn skills and attitudes that help you to participate in your own health care.  Learn new ways to care for yourself.  Be more accepting about how things are, and let things go.  Be less judgmental and approach things with an open mind.  Be patient with yourself and trust yourself more. MBSR has also been shown to:  Reduce negative emotions, such as depression and anxiety.  Improve memory and focus.  Change how you sense and approach pain.  Boost your body's ability to fight infections.  Help you connect better with other people.  Improve  your sense of well-being. Follow these instructions at home:   Find a local in-person or online MBSR program.  Set aside some time regularly for mindfulness practice.  Find a mindfulness practice that works best for you. This may include one or more of the following: ? Meditation. Meditation involves focusing your mind on a certain thought or activity. ? Breathing awareness exercises. These help you to stay present by focusing on your breath. ? Body scan. For this practice, you lie down and pay attention to each part of your body from head to toe. You can identify tension and soreness and intentionally relax parts of your body. ? Yoga. Yoga involves stretching and breathing, and it can improve your ability to move and be flexible. It can also provide an experience of testing your body's limits, which can help you release stress. ? Mindful eating. This way of eating involves focusing on the taste, texture, color, and smell of each bite of food. Because this slows down eating and helps you feel full sooner, it can be an important part of a weight-loss plan.  Find a podcast or recording that provides guidance for breathing awareness, body scan, or meditation exercises. You can listen to these any time when you have a free moment to rest without distractions.  Follow your treatment plan as told by your health care provider. This may include taking regular medicines and making changes to your diet or lifestyle as recommended. How to practice mindfulness To do a basic awareness exercise:  Find a comfortable place to sit.  Pay attention to the present moment. Observe your thoughts, feelings, and surroundings just as they are.  Avoid placing judgment  on yourself, your feelings, or your surroundings. Make note of any judgment that comes up, and let it go.  Your mind may wander, and that is okay. Make note of when your thoughts drift, and return your attention to the present moment. To do basic  mindfulness meditation:  Find a comfortable place to sit. This may include a stable chair or a firm floor cushion. ? Sit upright with your back straight. Let your arms fall next to your side with your hands resting on your legs. ? If sitting in a chair, rest your feet flat on the floor. ? If sitting on a cushion, cross your legs in front of you.  Keep your head in a neutral position with your chin dropped slightly. Relax your jaw and rest the tip of your tongue on the roof of your mouth. Drop your gaze to the floor. You can close your eyes if you like.  Breathe normally and pay attention to your breath. Feel the air moving in and out of your nose. Feel your belly expanding and relaxing with each breath.  Your mind may wander, and that is okay. Make note of when your thoughts drift, and return your attention to your breath.  Avoid placing judgment on yourself, your feelings, or your surroundings. Make note of any judgment or feelings that come up, let them go, and bring your attention back to your breath.  When you are ready, lift your gaze or open your eyes. Pay attention to how your body feels after the meditation. Where to find more information You can find more information about MBSR from:  Your health care provider.  Community-based meditation centers or programs.  Programs offered near you. Summary  Mindfulness-based stress reduction (MBSR) is a program that teaches you how to intentionally pay attention to the present moment. It is used with other treatments to help you cope better with daily stress, emotions, and pain.  MBSR focuses on developing self-awareness, which allows you to respond to life stress without judgment or negative emotions.  MBSR programs may involve learning different mindfulness practices, such as breathing exercises, meditation, yoga, body scan, or mindful eating. Find a mindfulness practice that works best for you, and set aside time for it on a regular  basis. This information is not intended to replace advice given to you by your health care provider. Make sure you discuss any questions you have with your health care provider. Document Revised: 05/06/2017 Document Reviewed: 09/30/2016 Elsevier Patient Education  Center.

## 2019-11-06 NOTE — Progress Notes (Signed)
Chronic Care Management Pharmacy  Name: Megan Short  MRN: [id number] DOB: 10-23-1945  Chief Complaint/ HPI  Megan Short,  74 y.o. , female presents for their Follow-Up CCM visit with the clinical pharmacist via telephone due to COVID-19 Pandemic.  PCP : [name], MD  Their chronic conditions include: COPD, DM  Office Visits:NA  Consult Visit:NA  Medications: Outpatient Encounter Medications as of 11/06/2019  Medication Sig Note  . aspirin 81 MG tablet Take by mouth. 09/07/2014: Received from: [facility]  . atorvastatin (LIPITOR) 80 MG tablet Take 1 tablet (80 mg total) by mouth every evening.   . blood glucose meter kit and supplies Dispense based on patient and insurance preference. Check bid daily as directed. (FOR ICD-10 E10.9, E11.9).   . citalopram (CELEXA) 40 MG tablet Take 1 tablet (40 mg total) by mouth daily.   . clonazePAM (KLONOPIN) 0.5 MG tablet Take 1 tablet (0.5 mg total) by mouth 2 (two) times daily as needed. (Patient taking differently: Take 0.5 mg by mouth 2 (two) times daily as needed. Rarely)   . glucose blood (ACCU-CHEK GUIDE) test strip USE TO CHECK BLOOD SUGAR TWICE DAILY AS DIRECTED   . glucose blood (ACCU-CHEK GUIDE) test strip USE TO CHECK BLOOD SUGAR DAILY AS DIRECTED  DX CODE:Controlled type 2 diabetes mellitus with microalbuminuria, without long-term current use of insulin (HCC)  E11.29, R80.9   . levothyroxine (SYNTHROID) 75 MCG tablet Take 1 tablet (75 mcg total) by mouth daily before breakfast.   . losartan (COZAAR) 100 MG tablet Take 1 tablet (100 mg total) by mouth daily.   . metFORMIN (GLUCOPHAGE-XR) 500 MG 24 hr tablet Take 1 tablet (500 mg total) by mouth 2 (two) times daily.    No facility-administered encounter medications on file as of 11/06/2019.      Physical Activity: Sufficiently Active  . Days of Exercise per Week: 4 days  . Minutes of Exercise per Session: 40 min   Current Diagnosis/Assessment:  Goals  Addressed   None     COPD / Asthma / Tobacco   Eosinophil count:   Lab Results  Component Value Date/Time   EOSPCT 3.0 03/07/2019 12:00 AM  %                               Eos (Absolute):  Lab Results  Component Value Date/Time   EOSABS 177 03/07/2019 12:00 AM    Tobacco Status:  Social History   Tobacco Use  Smoking Status Never Smoker  Smokeless Tobacco Never Used    We discussed:   Husband smoking outside due to patient's Mom living with them.  Plan  Continue family smoking outside  Diabetes   Recent Relevant Labs: Lab Results  Component Value Date/Time   HGBA1C 6.5 07/30/2019 09:44 AM   HGBA1C 6.4 (H) 03/07/2019 12:00 AM   HGBA1C 6.2 10/24/2018 09:22 AM   HGBA1C 6.2 11/17/2017 09:10 AM   HGBA1C 6.8 09/04/2015 10:23 AM   MICROALBUR 1.4 03/07/2019 12:00 AM   MICROALBUR 5.6 03/21/2018 11:10 AM   MICROALBUR 50 06/12/2015 10:18 AM    Last diabetic Foot exam:  Lab Results  Component Value Date/Time   HMDIABEYEEXA No Retinopathy 07/27/2019 12:00 AM    Last diabetic Eye exam: No results found for: HMDIABFOOTEX   We discussed:  Scratches worsen with carbs - didn't try Claritin  Walking 1 hour 2 - 3 weekly Sweating easing up  Plan  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 Yes Pt endorses 100% compliance  We discussed:  [facility]stream pharmacy. Doesn't want to switch.  Plan  Continue current medication management strategy  Follow up: 3 month phone visit

## 2019-12-06 ENCOUNTER — Ambulatory Visit
Admission: RE | Admit: 2019-12-06 | Discharge: 2019-12-06 | Disposition: A | Discharge status: Home / Self Care | Payer: Medicare Other | Source: Ambulatory Visit | Attending: Family Medicine | Admitting: Family Medicine

## 2019-12-06 DIAGNOSIS — Z1231 Encounter for screening mammogram for malignant neoplasm of breast: Secondary | ICD-10-CM | POA: Diagnosis present

## 2020-01-16 IMAGING — MG MM DIGITAL SCREENING BILAT W/ TOMO W/ CAD
8 series · 9 of 24 positions shown · non-contrast
Comparison: Previous exam(s).

CLINICAL DATA: Screening.

EXAM:
DIGITAL SCREENING BILATERAL MAMMOGRAM WITH TOMO AND CAD

[R CC synth-2D]
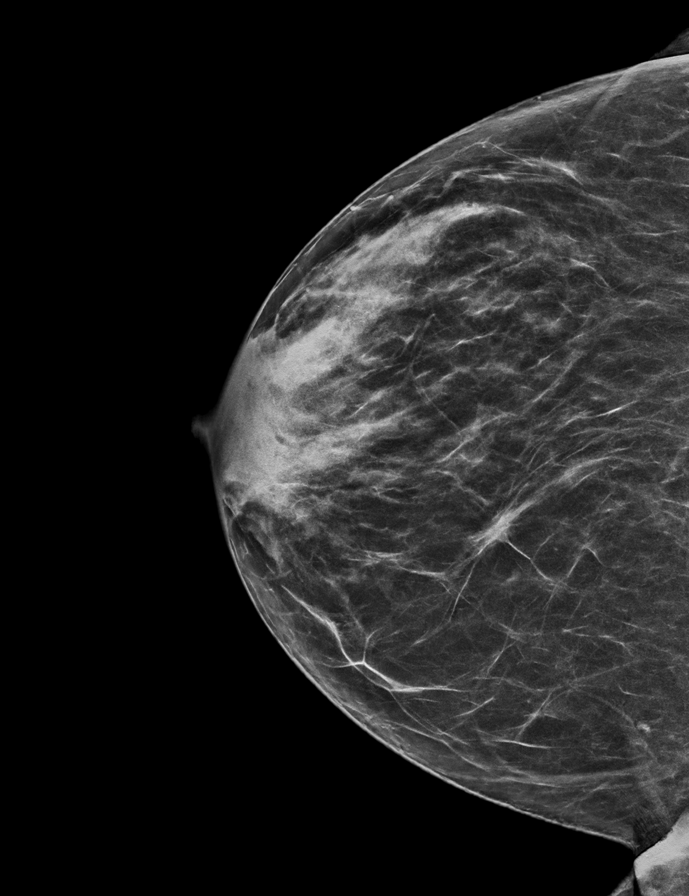

[L MLO synth-2D]
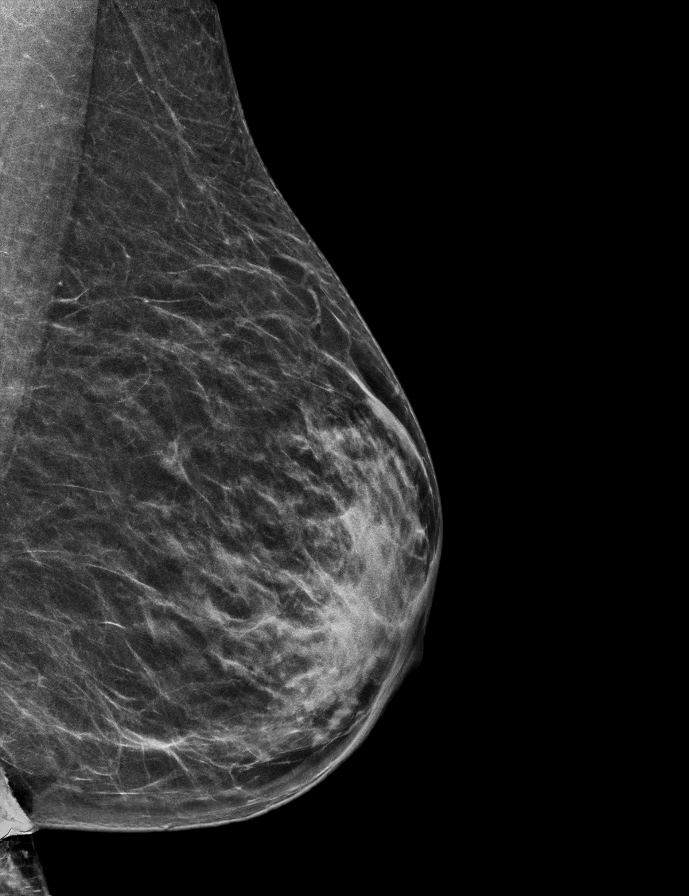

[L CC synth-2D]
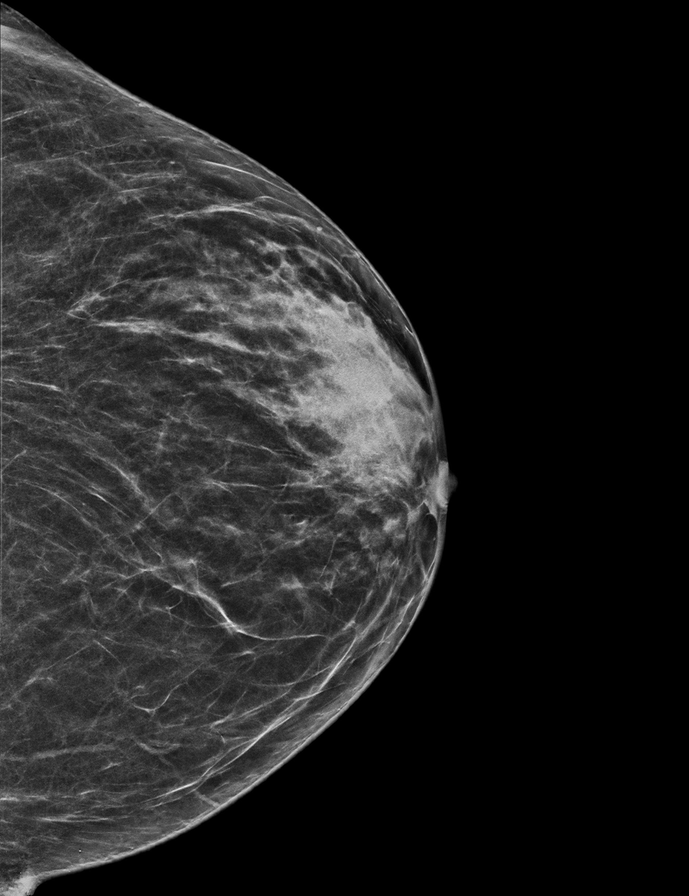

[R MLO synth-2D]
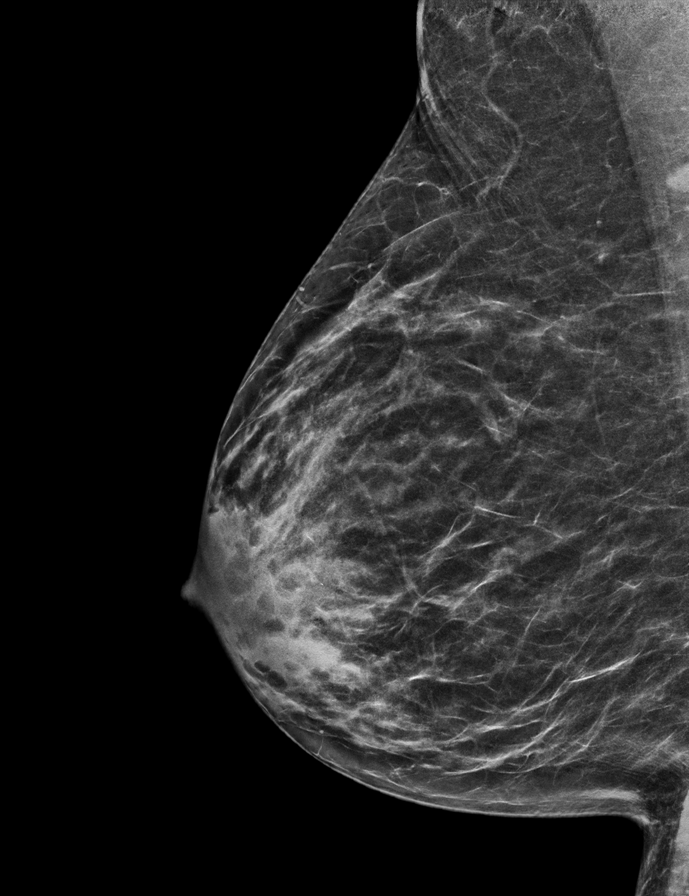

[R CC tomo · 2 of 55 frames shown]
[frame 18/55]
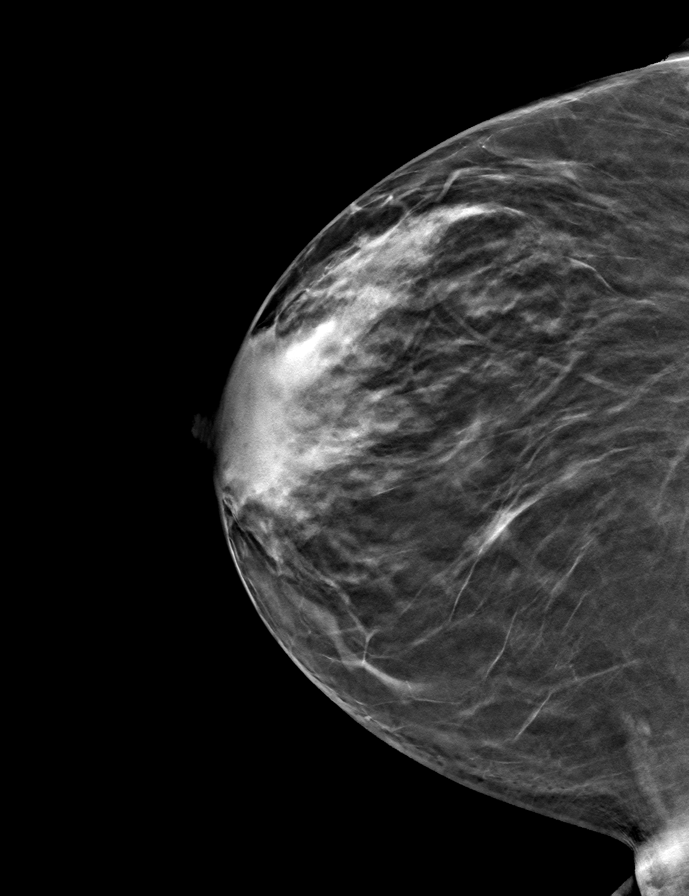
[frame 28/55]
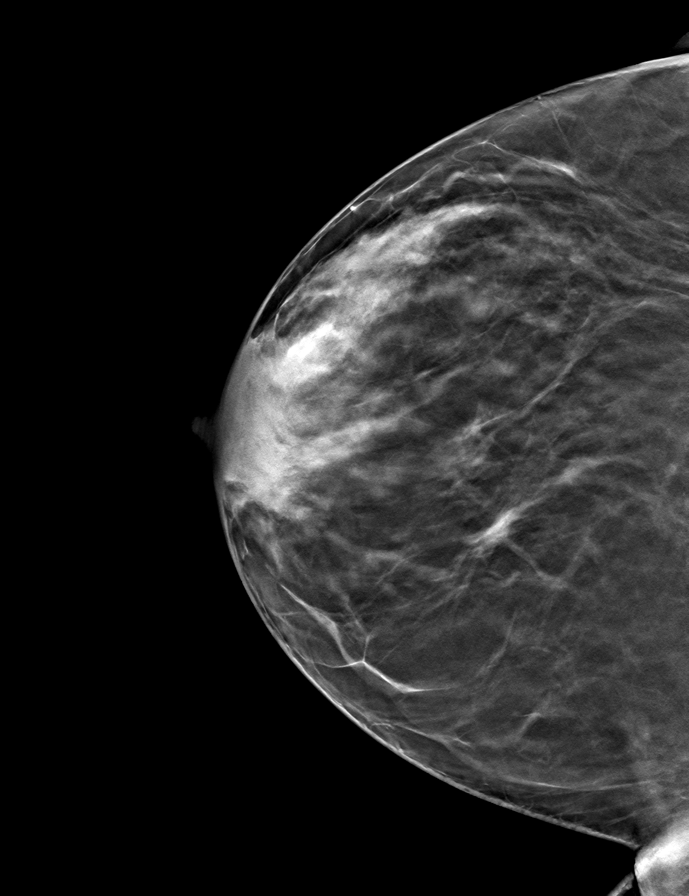

[L CC tomo · tomo slice 25/49.0]
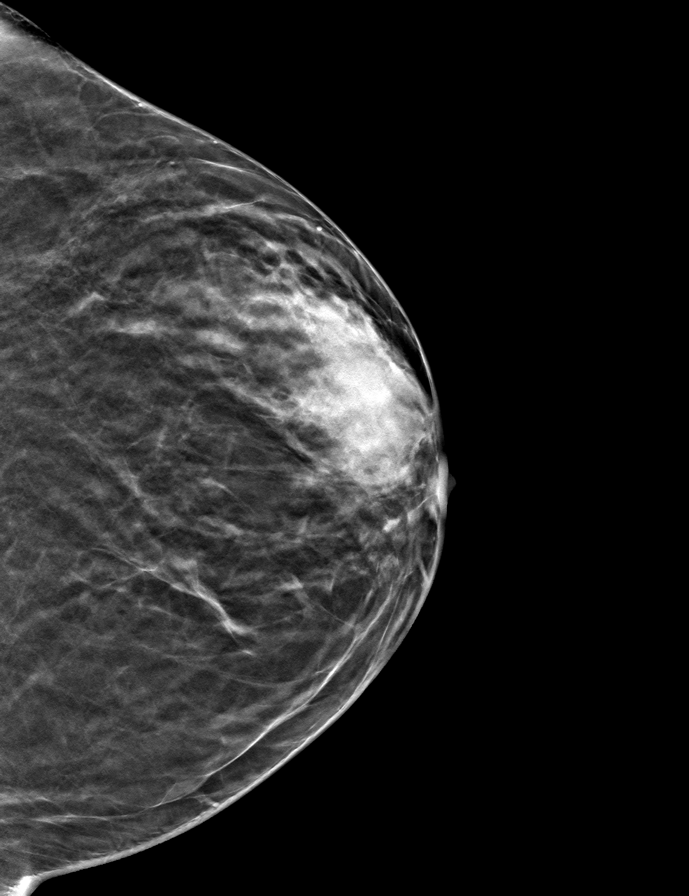

[R MLO tomo · tomo slice 28/55.0]
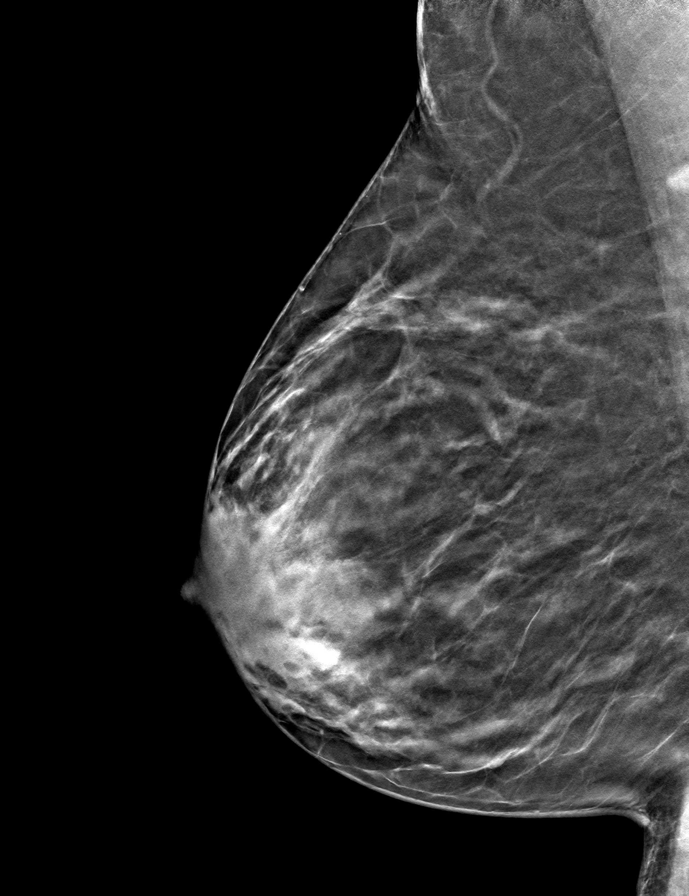

[L MLO tomo · tomo slice 27/52.0]
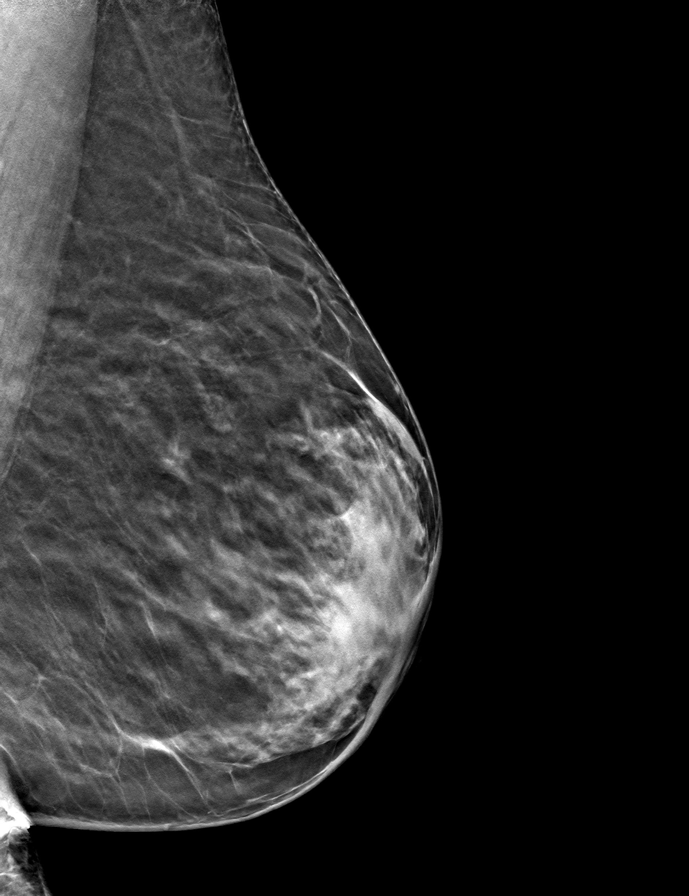

[9 of 24 positions shown; findings below may reference images not displayed]

ACR Breast Density Category c: The breast tissue is heterogeneously
dense, which may obscure small masses.
FINDINGS: There are no findings suspicious for malignancy. Images were
processed with CAD.
IMPRESSION: No mammographic evidence of malignancy. A result letter of this
screening mammogram will be mailed directly to the patient.

RECOMMENDATION:
Screening mammogram in one year. (Code:[id number])

BI-RADS CATEGORY  1: Negative.

## 2020-01-28 NOTE — Progress Notes (Signed)
Name: Megan Short   MRN: [id number]    DOB: 13-Nov-1945   Date:01/29/2020       Progress Note  Subjective  Chief Complaint  Chief Complaint  Patient presents with  . Follow-up  . Diabetes    HPI  HTN: taking medication - Losartan  daily and denies side effects. No chest pain, no palpitation, or orthostatic changes.  Last GFR was down to 59 again, we will recheck labs today   Hypothyroidism: denies fatigue, hair loss,dry skin, or constipation . She has noticed intermittent dysphagia - feels like she is choking, happening about once a week, discussed [place], but she would like to hold off for now since she has been under more stress. She is taking Levothyroxine as prescribed last TSH at goal. We will recheck labs today    DMII: She states her mother got sicker and went under hospice care and died a few weeks ago, she has not been as active for the past few months and not as compliant with her diet. She was eating desserts to cope with stress and A1C has gone up from 6.5 % to 7.5 %.  She denies polyphagia, polydipsia, or polyuria. She is current on Metformin 1000 mg daily we will increase to 1500 mg daily and recheck next visit. She will resume  her diet and has resumed walks this past week.   She is taking ARB and last urine micro has improved down from 100but last time it was normal.Seen byNephrologist - Dr. Candiss Norse - in the pasbut has been released. Hyperparathyroidism resolved.     Major Depression: it was caused by death of her son 74BSWHQ ago( complications of lupus died at age 54), she is doing well on Celexa and BZD's very seldom - last rx was given one year ago . She denies anhedonia, or crying spells, still has difficulty making decisions ( like picking out an outfit) but not about finances. Her mother died recently phq 23 is 5 she states improving now, she was very upset about one week ago   Dyslipidemia: on Atorvastatin,denies myalgias, doing well on medication. Last LDL was a  little higher than usual at 76, used to be 58 , recheck it today   Vitamin D deficiency: last labs were at goal, taking otc supplementation    Patient Active Problem List   Diagnosis Date Noted  . Umbilical hernia without obstruction and without gangrene 09/13/2016  . Diabetes mellitus with renal manifestations, controlled ([place]) 09/07/2014  . Adult hypothyroidism 09/07/2014  . Vitamin D deficiency 09/07/2014  . Benign essential HTN 09/07/2014  . Microalbuminuria 09/07/2014  . Dyslipidemia 09/07/2014  . Major depression in full remission ([place]) 09/07/2014  . 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/07/2014  . Calculus of kidney 09/07/2014  . Memory loss or impairment 09/07/2014    Past Surgical History:  Procedure Laterality Date  . BREAST BIOPSY Right    negative times 2  . COLONOSCOPY WITH PROPOFOL N/A 01/20/2015   Procedure: COLONOSCOPY WITH PROPOFOL;  Surgeon: Manya Silvas, MD;  Location[facility] ENDOSCOPY;  Service: Endoscopy;  Laterality: N/A;  . LITHOTRIPSY Left 12/2013    Family History  Problem Relation Age of Onset  . Osteoarthritis Mother   . Kidney failure Mother   . Failure to thrive Mother   . Osteoarthritis Sister   . Alzheimer's disease Maternal Aunt   . Breast cancer Neg Hx     Social History   Tobacco Use  . Smoking status: Never Smoker  .  Smokeless tobacco: Never Used  Substance Use Topics  . Alcohol use: No    Alcohol/week: 0.0 standard drinks     Current Outpatient Medications:  .  aspirin 81 MG tablet, Take by mouth., Disp: , Rfl:  .  atorvastatin (LIPITOR) 80 MG tablet, Take 1 tablet (80 mg total) by mouth every evening., Disp: 90 tablet, Rfl: 1 .  blood glucose meter kit and supplies, Dispense based on patient and insurance preference. Check bid daily as directed. (FOR ICD-10 E10.9, E11.9)., Disp: 1 each, Rfl: 0 .  citalopram (CELEXA) 40 MG tablet, Take 1 tablet (40 mg total) by mouth daily., Disp: 90 tablet, Rfl: 1 .   clonazePAM (KLONOPIN) 0.5 MG tablet, Take 1 tablet (0.5 mg total) by mouth 2 (two) times daily as needed. (Patient taking differently: Take 0.5 mg by mouth 2 (two) times daily as needed. Rarely), Disp: 30 tablet, Rfl: 0 .  glucose blood (ACCU-CHEK GUIDE) test strip, USE TO CHECK BLOOD SUGAR TWICE DAILY AS DIRECTED, Disp: 100 each, Rfl: 5 .  glucose blood (ACCU-CHEK GUIDE) test strip, USE TO CHECK BLOOD SUGAR DAILY AS DIRECTED  DX CODE:Controlled type 2 diabetes mellitus with microalbuminuria, without long-term current use of insulin (HCC)  E11.29, R80.9, Disp: 100 each, Rfl: 5 .  levothyroxine (SYNTHROID) 75 MCG tablet, Take 1 tablet (75 mcg total) by mouth daily before breakfast., Disp: 90 tablet, Rfl: 1 .  loratadine (CLARITIN) 10 MG tablet, Take 10 mg by mouth daily., Disp: , Rfl:  .  losartan (COZAAR) 100 MG tablet, Take 1 tablet (100 mg total) by mouth daily., Disp: 90 tablet, Rfl: 1 .  metFORMIN (GLUCOPHAGE-XR) 500 MG 24 hr tablet, Take 1 tablet (500 mg total) by mouth 2 (two) times daily., Disp: 180 tablet, Rfl: 1  Allergies  Allergen Reactions  . Sulfa Antibiotics     I personally reviewed active problem list, medication list, allergies, family history, social history, health maintenance with the patient/caregiver today.   ROS  Constitutional: Negative for fever, positive for  weight change.  Respiratory: Negative for cough and shortness of breath.   Cardiovascular: Negative for chest pain or palpitations.  Gastrointestinal: Negative for abdominal pain, no bowel changes.  Musculoskeletal: Negative for gait problem or joint swelling.  Skin: Negative for rash.  Neurological: Negative for dizziness or headache.  No other specific complaints in a complete review of systems (except as listed in HPI above).  Objective  Vitals:   01/29/20 0909  BP: 122/70  Pulse: 87  Resp: 14  Temp: 97.7 F (36.5 C)  TempSrc: Oral  SpO2: 100%  Weight: 155 lb 1.6 oz (70.4 kg)  Height: _0   (1.499 m)    Body mass index is 31.33 kg/m.  Physical Exam  Constitutional: Patient appears well-developed and well-nourished. Obese  No distress.  HEENT: head atraumatic, normocephalic, pupils equal and reactive to light,  neck supple Cardiovascular: Normal rate, regular rhythm and normal heart sounds.  No murmur heard. No BLE edema. Pulmonary/Chest: Effort normal and breath sounds normal. No respiratory distress. Abdominal: Soft.  There is no tenderness. Psychiatric: Patient has a normal mood and affect. behavior is normal. Judgment and thought content normal.  Diabetic Foot Exam - Simple   Simple Foot Form Diabetic Foot exam was performed with the following findings: Yes 01/29/2020 10:20 AM  Visual Inspection See comments: Yes Sensation Testing Intact to touch and monofilament testing bilaterally: Yes Pulse Check Comments Thick toenails     PHQ2/9: Depression screen [facility] 2/9 01/29/2020 07/30/2019  03/27/2019 01/26/2019 10/24/2018  Decreased Interest 0 0 0 0 0  Down, Depressed, Hopeless 1 0 0 0 0  PHQ - 2 Score 1 0 0 0 0  Altered sleeping 0 0 - 0 0  Tired, decreased energy 0 0 - 0 0  Change in appetite 3 0 - 0 0  Feeling bad or failure about yourself  0 0 - 0 0  Trouble concentrating 0 0 - 0 0  Moving slowly or fidgety/restless 1 0 - 0 0  Suicidal thoughts 0 0 - 0 0  PHQ-9 Score 5 0 - 0 0  Difficult doing work/chores Not difficult at all Not difficult at all - - Not difficult at all  Some recent data might be hidden    phq 9 is positive   Fall Risk: Fall Risk  01/29/2020 07/30/2019 03/27/2019 01/26/2019 10/24/2018  Falls in the past year? 0 0 0 0 0  Number falls in past yr: 0 0 0 0 0  Injury with Fall? 0 0 0 0 0  Comment - - - - -  Follow up - - Falls prevention discussed - -     Functional Status Survey: Is the patient deaf or have difficulty hearing?: No Does the patient have difficulty seeing, even when wearing glasses/contacts?: No Does the patient have  difficulty concentrating, remembering, or making decisions?: No Does the patient have difficulty walking or climbing stairs?: No Does the patient have difficulty dressing or bathing?: No Does the patient have difficulty doing errands alone such as visiting a doctor's office or shopping?: No    Assessment & Plan  1. Controlled type 2 diabetes mellitus with microalbuminuria, without long-term current use of insulin (HCC)  - POCT HgB A1C - losartan (COZAAR) 100 MG tablet; Take 1 tablet (100 mg total) by mouth daily.  Dispense: 90 tablet; Refill: 1 - metFORMIN (GLUCOPHAGE-XR) 750 MG 24 hr tablet; Take 1 tablet (750 mg total) by mouth 2 (two) times daily.  Dispense: 180 tablet; Refill: 1  2. Dyslipidemia  - atorvastatin (LIPITOR) 80 MG tablet; Take 1 tablet (80 mg total) by mouth every evening.  Dispense: 90 tablet; Refill: 1  3. Major depression in remission ([place])  - citalopram (CELEXA) 40 MG tablet; Take 1 tablet (40 mg total) by mouth daily.  Dispense: 90 tablet; Refill: 1 - clonazePAM (KLONOPIN) 0.5 MG tablet; Take 1 tablet (0.5 mg total) by mouth 2 (two) times daily as needed.  Dispense: 30 tablet; Refill: 0  4. Adult hypothyroidism  - levothyroxine (SYNTHROID) 75 MCG tablet; Take 1 tablet (75 mcg total) by mouth daily before breakfast.  Dispense: 90 tablet; Refill: 1  5. Essential hypertension  - losartan (COZAAR) 100 MG tablet; Take 1 tablet (100 mg total) by mouth daily.  Dispense: 90 tablet; Refill: 1  6. Benign essential HTN   7. Vitamin D deficiency   8. Stage 3a chronic kidney disease  Recheck labs   9. Benign hypertension with chronic kidney disease, stage III   10. Microalbuminuria   11. Allergic to food   She is now on loratadine and is doing better - loratadine (CLARITIN) 10 MG tablet; Take 1 tablet (10 mg total) by mouth daily.  Dispense: 90 tablet; Refill: 1

## 2020-01-29 ENCOUNTER — Other Ambulatory Visit: Payer: Self-pay

## 2020-01-29 ENCOUNTER — Encounter: Payer: Self-pay | Admitting: Family Medicine

## 2020-01-29 ENCOUNTER — Ambulatory Visit (INDEPENDENT_AMBULATORY_CARE_PROVIDER_SITE_OTHER): Payer: Medicare Other | Admitting: Family Medicine

## 2020-01-29 VITALS — BP 122/70 | HR 87 | Temp 97.7°F | Resp 14 | Ht 59.0 in | Wt 155.1 lb

## 2020-01-29 DIAGNOSIS — I129 Hypertensive chronic kidney disease with stage 1 through stage 4 chronic kidney disease, or unspecified chronic kidney disease: Secondary | ICD-10-CM

## 2020-01-29 DIAGNOSIS — E1129 Type 2 diabetes mellitus with other diabetic kidney complication: Secondary | ICD-10-CM

## 2020-01-29 DIAGNOSIS — I10 Essential (primary) hypertension: Secondary | ICD-10-CM | POA: Diagnosis not present

## 2020-01-29 DIAGNOSIS — F325 Major depressive disorder, single episode, in full remission: Secondary | ICD-10-CM

## 2020-01-29 DIAGNOSIS — R809 Proteinuria, unspecified: Secondary | ICD-10-CM

## 2020-01-29 DIAGNOSIS — E785 Hyperlipidemia, unspecified: Secondary | ICD-10-CM

## 2020-01-29 DIAGNOSIS — E559 Vitamin D deficiency, unspecified: Secondary | ICD-10-CM | POA: Diagnosis not present

## 2020-01-29 DIAGNOSIS — N1831 Chronic kidney disease, stage 3a: Secondary | ICD-10-CM | POA: Diagnosis not present

## 2020-01-29 DIAGNOSIS — N183 Chronic kidney disease, stage 3 unspecified: Secondary | ICD-10-CM | POA: Diagnosis not present

## 2020-01-29 DIAGNOSIS — E039 Hypothyroidism, unspecified: Secondary | ICD-10-CM

## 2020-01-29 DIAGNOSIS — Z91018 Allergy to other foods: Secondary | ICD-10-CM | POA: Diagnosis not present

## 2020-01-29 LAB — POCT GLYCOSYLATED HEMOGLOBIN (HGB A1C): Hemoglobin A1C: 7.5 % — AB (ref 4.0–5.6)

## 2020-01-29 MED ORDER — CITALOPRAM HYDROBROMIDE 40 MG PO TABS: 40.0000 mg | ORAL_TABLET | Freq: Every day | ORAL | 1 refills | Status: DC

## 2020-01-29 MED ORDER — LOSARTAN POTASSIUM 100 MG PO TABS: 100.0000 mg | ORAL_TABLET | Freq: Every day | ORAL | 1 refills | Status: DC

## 2020-01-29 MED ORDER — LEVOTHYROXINE SODIUM 75 MCG PO TABS: 75.0000 ug | ORAL_TABLET | Freq: Every day | ORAL | 1 refills | Status: DC

## 2020-01-29 MED ORDER — CLONAZEPAM 0.5 MG PO TABS: 0.5000 mg | ORAL_TABLET | Freq: Two times a day (BID) | ORAL | 0 refills | Status: DC | PRN

## 2020-01-29 MED ORDER — METFORMIN HCL ER 750 MG PO TB24: 750.0000 mg | ORAL_TABLET | Freq: Two times a day (BID) | ORAL | 1 refills | Status: DC

## 2020-01-29 MED ORDER — LORATADINE 10 MG PO TABS: 10.0000 mg | ORAL_TABLET | Freq: Every day | ORAL | 1 refills | Status: DC

## 2020-01-29 MED ORDER — ATORVASTATIN CALCIUM 80 MG PO TABS: 80.0000 mg | ORAL_TABLET | Freq: Every evening | ORAL | 1 refills | Status: DC

## 2020-01-29 NOTE — Patient Instructions (Signed)

## 2020-01-30 LAB — COMPLETE METABOLIC PANEL WITH GFR
AG Ratio: 1.2 (calc) (ref 1.0–2.5)
ALT: 30 U/L — ABNORMAL HIGH (ref 6–29)
AST: 52 U/L — ABNORMAL HIGH (ref 10–35)
Albumin: 3.8 g/dL (ref 3.6–5.1)
Alkaline phosphatase (APISO): 105 U/L (ref 37–153)
BUN/Creatinine Ratio: 18 (calc) (ref 6–22)
BUN: 17 mg/dL (ref 7–25)
CO2: 23 mmol/L (ref 20–32)
Calcium: 9.7 mg/dL (ref 8.6–10.4)
Chloride: 104 mmol/L (ref 98–110)
Creat: 0.97 mg/dL — ABNORMAL HIGH (ref 0.60–0.93)
GFR, Est African American: 67 mL/min/{1.73_m2} (ref >=60)
GFR, Est Non African American: 58 mL/min/{1.73_m2} — ABNORMAL LOW (ref >=60)
Globulin: 3.3 g/dL (calc) (ref 1.9–3.7)
Glucose, Bld: 158 mg/dL — ABNORMAL HIGH (ref 65–99)
Potassium: 4.7 mmol/L (ref 3.5–5.3)
Sodium: 136 mmol/L (ref 135–146)
Total Bilirubin: 0.3 mg/dL (ref 0.2–1.2)
Total Protein: 7.1 g/dL (ref 6.1–8.1)

## 2020-01-30 LAB — CBC WITH DIFFERENTIAL/PLATELET
Absolute Monocytes: 521 cells/uL (ref 200–950)
Basophils Absolute: 40 cells/uL (ref 0–200)
Basophils Relative: 0.6 %
Eosinophils Absolute: 152 cells/uL (ref 15–500)
Eosinophils Relative: 2.3 %
HCT: 40.1 % (ref 35.0–45.0)
Hemoglobin: 13.1 g/dL (ref 11.7–15.5)
Lymphs Abs: 2343 cells/uL (ref 850–3900)
MCH: 30.5 pg (ref 27.0–33.0)
MCHC: 32.7 g/dL (ref 32.0–36.0)
MCV: 93.5 fL (ref 80.0–100.0)
MPV: 10.2 fL (ref 7.5–12.5)
Monocytes Relative: 7.9 %
Neutro Abs: 3544 cells/uL (ref 1500–7800)
Neutrophils Relative %: 53.7 %
Platelets: 286 10*3/uL (ref 140–400)
RBC: 4.29 10*6/uL (ref 3.80–5.10)
RDW: 13.1 % (ref 11.0–15.0)
Total Lymphocyte: 35.5 %
WBC: 6.6 10*3/uL (ref 3.8–10.8)

## 2020-01-30 LAB — MICROALBUMIN / CREATININE URINE RATIO
Creatinine, Urine: 123 mg/dL (ref 20–275)
Microalb Creat Ratio: 27 mcg/mg creat (ref <30)
Microalb, Ur: 3.3 mg/dL

## 2020-01-30 LAB — LIPID PANEL
Cholesterol: 105 mg/dL (ref <200)
HDL: 34 mg/dL — ABNORMAL LOW (ref >=50)
LDL Cholesterol (Calc): 53 mg/dL (calc)
Non-HDL Cholesterol (Calc): 71 mg/dL (calc) (ref <130)
Total CHOL/HDL Ratio: 3.1 (calc) (ref <5.0)
Triglycerides: 98 mg/dL (ref <150)

## 2020-01-30 LAB — TSH: TSH: 1.72 mIU/L (ref 0.40–4.50)

## 2020-02-06 ENCOUNTER — Ambulatory Visit: Payer: Medicare Other | Admitting: Pharmacist

## 2020-02-06 ENCOUNTER — Other Ambulatory Visit: Payer: Self-pay

## 2020-02-06 DIAGNOSIS — E785 Hyperlipidemia, unspecified: Secondary | ICD-10-CM

## 2020-02-06 DIAGNOSIS — R809 Proteinuria, unspecified: Secondary | ICD-10-CM

## 2020-02-06 NOTE — Chronic Care Management (AMB) (Signed)
   Chronic Care Management Pharmacy  Name: Megan Short  MRN: [id number] DOB: 1945-11-03  Chief Complaint/ HPI  [name],  74 y.o. , female presents for their Initial CCM visit with the clinical pharmacist via telephone due to COVID-19 Pandemic.  PCP : [name], MD  Their chronic conditions include: HTN, HLD, DM  Office Visits:NA  Consult Visit:NA  Medications: Outpatient Encounter Medications as of 02/06/2020  Medication Sig Note  . aspirin 81 MG tablet Take by mouth. 09/07/2014: Received from: [facility]  . atorvastatin (LIPITOR) 80 MG tablet Take 1 tablet (80 mg total) by mouth every evening.   . blood glucose meter kit and supplies Dispense based on patient and insurance preference. Check bid daily as directed. (FOR ICD-10 E10.9, E11.9).   . citalopram (CELEXA) 40 MG tablet Take 1 tablet (40 mg total) by mouth daily.   . clonazePAM (KLONOPIN) 0.5 MG tablet Take 1 tablet (0.5 mg total) by mouth 2 (two) times daily as needed.   Marland Kitchen glucose blood (ACCU-CHEK GUIDE) test strip USE TO CHECK BLOOD SUGAR TWICE DAILY AS DIRECTED   . glucose blood (ACCU-CHEK GUIDE) test strip USE TO CHECK BLOOD SUGAR DAILY AS DIRECTED  DX CODE:Controlled type 2 diabetes mellitus with microalbuminuria, without long-term current use of insulin (HCC)  E11.29, R80.9   . levothyroxine (SYNTHROID) 75 MCG tablet Take 1 tablet (75 mcg total) by mouth daily before breakfast.   . loratadine (CLARITIN) 10 MG tablet Take 1 tablet (10 mg total) by mouth daily.   Marland Kitchen losartan (COZAAR) 100 MG tablet Take 1 tablet (100 mg total) by mouth daily.   . metFORMIN (GLUCOPHAGE-XR) 750 MG 24 hr tablet Take 1 tablet (750 mg total) by mouth 2 (two) times daily.    No facility-administered encounter medications on file as of 02/06/2020.      Financial Resource Strain:   . Difficulty of Paying Living Expenses: Not on file    Current Diagnosis/Assessment:  Goals Addressed   None    Diabetes   Recent  Relevant Labs: Lab Results  Component Value Date/Time   HGBA1C 7.5 (A) 01/29/2020 10:30 AM   HGBA1C 6.5 07/30/2019 09:44 AM   HGBA1C 6.4 (H) 03/07/2019 12:00 AM   HGBA1C 6.2 10/24/2018 09:22 AM   HGBA1C 6.2 11/17/2017 09:10 AM   HGBA1C 6.8 09/04/2015 10:23 AM   MICROALBUR 3.3 01/29/2020 10:34 AM   MICROALBUR 1.4 03/07/2019 12:00 AM   MICROALBUR 50 06/12/2015 10:18 AM     Checking BG: Rarely  Patient is currently uncontrolled on the following medications: metformin  Last diabetic Foot exam:  Lab Results  Component Value Date/Time   HMDIABEYEEXA No Retinopathy 07/27/2019 12:00 AM    Last diabetic Eye exam: No results found for: HMDIABFOOTEX   We discussed:  Stress eating responsible for A1c Increased metformin at PCP visit last week Will check A1c next f/u  Plan  Will improve diet Continue current medications   Medication Management   Pt uses [facility] for all medications Uses pill box? Yes Pt endorses 100% compliance  We discussed:  Scratches improved with Claritin Husband back to smoking inside  Plan  Continue current medication management strategy  Follow up: 3 month phone visit  Milus Height, PharmD, [place], [facility] [phone]

## 2020-02-09 NOTE — Patient Instructions (Addendum)
Visit Information  Goals Addressed            This Visit's Progress   . Diabetes Type 2 - goal A1c < 7%       CARE PLAN ENTRY (see longitudinal plan of care for additional care plan information)  Current Barriers:  . Diabetes: mellitus type 2; complicated by chronic medical conditions including HTN, HTN, second hand smoke Lab Results  Component Value Date   HGBA1C 6.5 07/30/2019 .   Lab Results  Component Value Date   CREATININE 1.09 (H) 03/07/2019   CREATININE 1.03 (H) 03/21/2018   CREATININE 1.07 (H) 07/19/2017   . Current antihyperglycemic regimen: metformin 1000mg  every morning . Reports hypoglycemic symptoms, including shaking, sweating . Denies hyperglycemic symptoms, including polyuria, polydipsia, polyphagia, nocturia, blurred vision, neuropathy . Current exercise: moderate walking . Current blood glucose readings: 120 range  Pharmacist Clinical Goal(s):  Over the next 30 days, patient will work with PharmD and primary care provider to address sweating of unknown etiology, potentially hypglycemia  Interventions: . Comprehensive medication review performed, medication list updated in electronic medical record . Inter-disciplinary care team collaboration (see longitudinal plan of care) . Now taking metformin as prescribed twice daily due to possible hypoglycemia  Patient Self Care Activities:  . Patient will check blood glucose when sweating , document, and provide at future appointments . Patient will focus on medication adherence by continuing to take metformin as directed . Patient will contact provider with any episodes of hypoglycemia . Patient will report any questions or concerns to provider   Initial goal documentation        Print copy of patient instructions provided.   Telephone follow up appointment with pharmacy team member scheduled for: 3 months  Marland Kitchen, PharmD, [place], CTTS Clinical Pharmacist [facility] [phone]  Dyslipidemia Dyslipidemia is an imbalance of waxy, fat-like substances (lipids) in the blood. The body needs lipids in small amounts. Dyslipidemia often involves a high level of cholesterol or triglycerides, which are types of lipids. Common forms of dyslipidemia include:  High levels of LDL cholesterol. LDL is the type of cholesterol that causes fatty deposits (plaques) to build up in the blood vessels that carry blood away from your heart (arteries).  Low levels of HDL cholesterol. HDL cholesterol is the type of cholesterol that protects against heart disease. High levels of HDL remove the LDL buildup from arteries.  High levels of triglycerides. Triglycerides are a fatty substance in the blood that is linked to a buildup of plaques in the arteries. What are the causes? Primary dyslipidemia is caused by changes (mutations) in genes that are passed down through families (inherited). These mutations cause several types of dyslipidemia. Secondary dyslipidemia is caused by lifestyle choices and diseases that lead to dyslipidemia, such as:  Eating a diet that is high in animal fat.  Not getting enough exercise.  Having diabetes, kidney disease, liver disease, or thyroid disease.  Drinking large amounts of alcohol.  Using certain medicines. What increases the risk? You are more likely to develop this condition if you are an older man or if you are a woman who has gone through menopause. Other risk factors include:  Having a family history of dyslipidemia.  Taking certain medicines, including birth control pills, steroids, some diuretics, and beta-blockers.  Smoking cigarettes.  Eating a high-fat diet.  Having certain medical conditions such as diabetes, polycystic ovary syndrome (PCOS), kidney disease, liver disease, or hypothyroidism.  Not exercising regularly.  Being overweight  or obese with too much belly fat. What are the signs or symptoms? In most  cases, dyslipidemia does not usually cause any symptoms. In severe cases, very high lipid levels can cause:  Fatty bumps under the skin (xanthomas).  White or gray ring around the black center (pupil) of the eye. Very high triglyceride levels can cause inflammation of the pancreas (pancreatitis). How is this diagnosed? Your health care provider may diagnose dyslipidemia based on a routine blood test (fasting blood test). Because most people do not have symptoms of the condition, this blood testing (lipid profile) is done on adults age 42 and older and is repeated every 5 years. This test checks:  Total cholesterol. This measures the total amount of cholesterol in your blood, including LDL cholesterol, HDL cholesterol, and triglycerides. A healthy number is below 200.  LDL cholesterol. The target number for LDL cholesterol is different for each person, depending on individual risk factors. Ask your health care provider what your LDL cholesterol should be.  HDL cholesterol. An HDL level of 60 or higher is best because it helps to protect against heart disease. A number below 40 for men or below 50 for women increases the risk for heart disease.  Triglycerides. A healthy triglyceride number is below 150. If your lipid profile is abnormal, your health care provider may do other blood tests. How is this treated? Treatment depends on the type of dyslipidemia that you have and your other risk factors for heart disease and stroke. Your health care provider will have a target range for your lipid levels based on this information. For many people, this condition may be treated by lifestyle changes, such as diet and exercise. Your health care provider may recommend that you:  Get regular exercise.  Make changes to your diet.  Quit smoking if you smoke. If diet changes and exercise do not help you reach your goals, your health care provider may also prescribe medicine to lower lipids. The most  commonly prescribed type of medicine lowers your LDL cholesterol (statin drug). If you have a high triglyceride level, your provider may prescribe another type of drug (fibrate) or an omega-3 fish oil supplement, or both. Follow these instructions at home:  Eating and drinking  Follow instructions from your health care provider or dietitian about eating or drinking restrictions.  Eat a healthy diet as told by your health care provider. This can help you reach and maintain a healthy weight, lower your LDL cholesterol, and raise your HDL cholesterol. This may include: ? Limiting your calories, if you are overweight. ? Eating more fruits, vegetables, whole grains, fish, and lean meats. ? Limiting saturated fat, trans fat, and cholesterol.  If you drink alcohol: ? Limit how much you use. ? Be aware of how much alcohol is in your drink. In the [place] one drink equals one 12 oz bottle of beer (355 mL), one 5 oz glass of wine (148 mL), or one 1 oz glass of hard liquor (44 mL).  Do not drink alcohol if: ? Your health care provider tells you not to drink. ? You are pregnant, may be pregnant, or are planning to become pregnant. Activity  Get regular exercise. Start an exercise and strength training program as told by your health care provider. Ask your health care provider what activities are safe for you. Your health care provider may recommend: ? 30 minutes of aerobic activity 4-6 days a week. Brisk walking is an example of aerobic activity. ? Strength training  2 days a week. General instructions  Do not use any products that contain nicotine or tobacco, such as cigarettes, e-cigarettes, and chewing tobacco. If you need help quitting, ask your health care provider.  Take over-the-counter and prescription medicines only as told by your health care provider. This includes supplements.  Keep all follow-up visits as told by your health care provider. Contact a health care provider if:  You  are: ? Having trouble sticking to your exercise or diet plan. ? Struggling to quit smoking or control your use of alcohol. Summary  Dyslipidemia often involves a high level of cholesterol or triglycerides, which are types of lipids.  Treatment depends on the type of dyslipidemia that you have and your other risk factors for heart disease and stroke.  For many people, treatment starts with lifestyle changes, such as diet and exercise.  Your health care provider may prescribe medicine to lower lipids. This information is not intended to replace advice given to you by your health care provider. Make sure you discuss any questions you have with your health care provider. Document Revised: 01/16/2018 Document Reviewed: 12/23/2017 Elsevier Patient Education  2020 [employer].

## 2020-02-26 ENCOUNTER — Telehealth: Payer: Self-pay

## 2020-02-26 NOTE — Telephone Encounter (Signed)
Copied from CRM [id number]. Topic: Quick Communication - See Telephone Encounter >> Feb 25, 2020  3:54 PM Aretta Nip wrote: CRM for notification. See Telephone encounter for: 02/25/20. States that her Blood Glucose is running high in the am's in the high 100's like 194 today. However she just took and was 249. She also admitted that she is still eating watermelon. Pls FU at [phone] Mother also just died.

## 2020-02-27 NOTE — Telephone Encounter (Signed)
Left detailed vm °

## 2020-03-01 DIAGNOSIS — Z23 Encounter for immunization: Secondary | ICD-10-CM | POA: Diagnosis not present

## 2020-03-12 ENCOUNTER — Telehealth: Payer: Self-pay

## 2020-03-12 NOTE — Progress Notes (Signed)
..   Recent Relevant Labs: Lab Results  Component Value Date/Time   HGBA1C 7.5 (A) 01/29/2020 10:30 AM   HGBA1C 6.5 07/30/2019 09:44 AM   HGBA1C 6.4 (H) 03/07/2019 12:00 AM   HGBA1C 6.2 10/24/2018 09:22 AM   HGBA1C 6.2 11/17/2017 09:10 AM   HGBA1C 6.8 09/04/2015 10:23 AM   MICROALBUR 3.3 01/29/2020 10:34 AM   MICROALBUR 1.4 03/07/2019 12:00 AM   MICROALBUR 50 06/12/2015 10:18 AM    Kidney Function Lab Results  Component Value Date/Time   CREATININE 0.97 (H) 01/29/2020 10:34 AM   CREATININE 1.09 (H) 03/07/2019 12:00 AM   GFRNONAA 58 (L) 01/29/2020 10:34 AM   GFRAA 67 01/29/2020 10:34 AM    . Current antihyperglycemic regimen:  Metformin,  . What recent interventions/DTPs have been made to improve glycemic control:  o Increase excercise . Have there been any recent hospitalizations or ED visits since last visit with CPP? No . Patient denies hypoglycemic symptoms, including None . Patient denies hyperglycemic symptoms, including none . How often are you checking your blood sugar? before breakfast . What are your blood sugars ranging?  o Fasting: 160-270  . During the week, how often does your blood glucose drop below 70? Never . Are you checking your feet daily/regularly?   Adherence Review: Is the patient currently on a STATIN medication? Yes Is the patient currently on ACE/ARB medication? Yes Does the patient have >5 day gap between last estimated fill dates? No  Reviewed diet and exercise with patient. Explained fruit can increase sugar. Also reviewed impacts of stress on blood glucose levels. Reiterated Dr. Carlynn Purl request that patient move follow up to an earlier date if fasting blood glucose continues to be above 180

## 2020-04-01 ENCOUNTER — Ambulatory Visit (INDEPENDENT_AMBULATORY_CARE_PROVIDER_SITE_OTHER): Payer: Medicare Other

## 2020-04-01 ENCOUNTER — Other Ambulatory Visit: Payer: Self-pay

## 2020-04-01 VITALS — BP 140/80 | HR 86 | Temp 98.0°F | Resp 16 | Ht 59.0 in | Wt 157.4 lb

## 2020-04-01 DIAGNOSIS — Z0000 Encounter for general adult medical examination without abnormal findings: Secondary | ICD-10-CM

## 2020-04-01 DIAGNOSIS — Z23 Encounter for immunization: Secondary | ICD-10-CM

## 2020-04-01 NOTE — Progress Notes (Signed)
Subjective:   Megan Short is a 74 y.o. female who presents for Medicare Annual (Subsequent) preventive examination.  Review of Systems     Cardiac Risk Factors include: advanced age (>68mn, >>50women);diabetes mellitus;dyslipidemia;hypertension;obesity (BMI >30kg/m2)     Objective:    Today's Vitals   04/01/20 1002  BP: 140/80  Pulse: 86  Resp: 16  Temp: 98 F (36.7 C)  TempSrc: Oral  SpO2: 99%  Weight: 157 lb 6.4 oz (71.4 kg)  Height: 4' 11" (1.499 m)   Body mass index is 31.79 kg/m.  Advanced Directives 04/01/2020 03/27/2019 12/14/2016 09/13/2016 05/13/2016 01/12/2016 09/10/2015  Does Patient Have a Medical Advance Directive? No Yes _0   Type of Advance Directive - [place] - - - - -  Copy of [place]? - No - copy requested - - - - -  Would patient like information on creating a medical advance directive? Yes (MAU/Ambulatory/Procedural Areas - Information given) - - - - No - patient declined information No - patient declined information    Current Medications (verified) Outpatient Encounter Medications as of 04/01/2020  Medication Sig  . aspirin 81 MG tablet Take by mouth.  .Marland Kitchenatorvastatin (LIPITOR) 80 MG tablet Take 1 tablet (80 mg total) by mouth every evening.  . blood glucose meter kit and supplies Dispense based on patient and insurance preference. Check bid daily as directed. (FOR ICD-10 E10.9, E11.9).  . citalopram (CELEXA) 40 MG tablet Take 1 tablet (40 mg total) by mouth daily.  . clonazePAM (KLONOPIN) 0.5 MG tablet Take 1 tablet (0.5 mg total) by mouth 2 (two) times daily as needed.  .Marland Kitchenglucose blood (ACCU-CHEK GUIDE) test strip USE TO CHECK BLOOD SUGAR TWICE DAILY AS DIRECTED  . glucose blood (ACCU-CHEK GUIDE) test strip USE TO CHECK BLOOD SUGAR DAILY AS DIRECTED  DX CODE:Controlled type 2 diabetes mellitus with microalbuminuria, without long-term current use of insulin (HCC)  E11.29, R80.9  .  levothyroxine (SYNTHROID) 75 MCG tablet Take 1 tablet (75 mcg total) by mouth daily before breakfast.  . losartan (COZAAR) 100 MG tablet Take 1 tablet (100 mg total) by mouth daily.  . metFORMIN (GLUCOPHAGE-XR) 750 MG 24 hr tablet Take 1 tablet (750 mg total) by mouth 2 (two) times daily.  .Marland Kitchenloratadine (CLARITIN) 10 MG tablet Take 1 tablet (10 mg total) by mouth daily.   No facility-administered encounter medications on file as of 04/01/2020.    Allergies (verified) Sulfa antibiotics   History: Past Medical History:  Diagnosis Date  . Anxiety   . Depression   . Diabetes mellitus without complication ([place]   . Hyperlipidemia   . Hypertension   . Hypothyroidism    Past Surgical History:  Procedure Laterality Date  . BREAST BIOPSY Right    negative times 2  . COLONOSCOPY WITH PROPOFOL N/A 01/20/2015   Procedure: COLONOSCOPY WITH PROPOFOL;  Surgeon: RManya Silvas MD;  [facility]  Service: Endoscopy;  Laterality: N/A;  . LITHOTRIPSY Left 12/2013   Family History  Problem Relation Age of Onset  . Osteoarthritis Mother   . Kidney failure Mother   . Failure to thrive Mother   . Osteoarthritis Sister   . Alzheimer's disease Maternal Aunt   . Breast cancer Neg Hx    Social History   Socioeconomic History  . Marital status: Married    Spouse name: BMortimer Short  . Number of children: 1  . Years of education:  Not on file  . Highest education level: Some college, no degree  Occupational History  . Occupation: retired     Comment: Therapist, art rep   Tobacco Use  . Smoking status: Never Smoker  . Smokeless tobacco: Never Used  Vaping Use  . Vaping Use: Never used  Substance and Sexual Activity  . Alcohol use: No    Alcohol/week: 0.0 standard drinks  . Drug use: No  . Sexual activity: Not Currently  Other Topics Concern  . Not on file  Social History Narrative   Married, they had one son that died with complications of lupus   Social Determinants of  Health   Financial Resource Strain: Low Risk   . Difficulty of Paying Living Expenses: Not hard at all  Food Insecurity: No Food Insecurity  . Worried About Charity fundraiser in the Last Year: Never true  . Ran Out of Food in the Last Year: Never true  Transportation Needs: No Transportation Needs  . Lack of Transportation (Medical): No  . Lack of Transportation (Non-Medical): No  Physical Activity: Sufficiently Active  . Days of Exercise per Week: 4 days  . Minutes of Exercise per Session: 40 min  Stress: No Stress Concern Present  . Feeling of Stress : Not at all  Social Connections: Moderately Integrated  . Frequency of Communication with Friends and Family: More than three times a week  . Frequency of Social Gatherings with Friends and Family: Three times a week  . Attends Religious Services: More than 4 times per year  . Active Member of Clubs or Organizations: No  . Attends Archivist Meetings: Never  . Marital Status: Married    Tobacco Counseling Counseling given: Not Answered   Clinical Intake:  Pre-visit preparation completed: Yes  Pain : No/denies pain     BMI - recorded: 31.79 Nutritional Status: BMI > 30  Obese Nutritional Risks: None Diabetes: Yes CBG done?: No Did pt. bring in CBG monitor from home?: No  How often do you need to have someone help you when you read instructions, pamphlets, or other written materials from your doctor or pharmacy?: 1 - Never  Nutrition Risk Assessment:  Has the patient had any N/V/D within the last 2 months?  No  Does the patient have any non-healing wounds?  No  Has the patient had any unintentional weight loss or weight gain?  No   Diabetes:  Is the patient diabetic?  Yes  If diabetic, was a CBG obtained today?  No  Did the patient bring in their glucometer from home?  No  How often do you monitor your CBG's? daily.   Financial Strains and Diabetes Management:  Are you having any financial strains  with the device, your supplies or your medication? No .  Does the patient want to be seen by Chronic Care Management for management of their diabetes?  No   - already enrolled Would the patient like to be referred to a Nutritionist or for Diabetic Management?  No   Diabetic Exams:  Diabetic Eye Exam: Completed 07/27/19 negative retinopathy.   Diabetic Foot Exam: Completed 01/29/20.  Interpreter Needed?: No  Information entered by :: Clemetine Marker LPN   Activities of Daily Living In your present state of health, do you have any difficulty performing the following activities: 04/01/2020 01/29/2020  Hearing? N N  Comment declines hearing aids -  Vision? N N  Difficulty concentrating or making decisions? N N  Walking or climbing  stairs? N N  Dressing or bathing? N N  Doing errands, shopping? N N  Preparing Food and eating ? N -  Using the Toilet? N -  In the past six months, have you accidently leaked urine? N -  Do you have problems with loss of bowel control? N -  Managing your Medications? N -  Managing your Finances? N -  Housekeeping or managing your Housekeeping? N -  Some recent data might be hidden    Patient Care Team: [name], MD as PCP - General (Family Medicine) Gardiner Barefoot, DPM as Consulting Physician (Podiatry) Baxter Kail, MD as Consulting Physician (Dermatology) Verdia Kuba, [facility] (Pharmacist)  Indicate any recent Medical Services you may have received from other than [facility] providers in the past year (date may be approximate).     Assessment:   This is a routine wellness examination for [place].  Hearing/Vision screen  Hearing Screening   125Hz 250Hz 500Hz 1000Hz 2000Hz 3000Hz 4000Hz 6000Hz 8000Hz  Right ear:           Left ear:           Comments: Pt denies hearing difficulty  Vision Screening Comments: [facility] screen[facility] Dr. Wallace Going   Dietary issues and exercise activities discussed: Current Exercise  Habits: Home exercise routine, Type of exercise: walking, Time (Minutes): 40, Frequency (Times/Week): 4, Weekly Exercise (Minutes/Week): 160, Intensity: Moderate, Exercise limited by: None identified  Goals    . Diabetes Type 2 - goal A1c < 7%     CARE PLAN ENTRY (see longitudinal plan of care for additional care plan information)  Current Barriers:  . Diabetes: mellitus type 2; complicated by chronic medical conditions including HTN, HTN, second hand smoke Lab Results  Component Value Date   HGBA1C 6.5 07/30/2019 .   Lab Results  Component Value Date   CREATININE 1.09 (H) 03/07/2019   CREATININE 1.03 (H) 03/21/2018   CREATININE 1.07 (H) 07/19/2017   . Current antihyperglycemic regimen: metformin 1069m every morning . Reports hypoglycemic symptoms, including shaking, sweating . Denies hyperglycemic symptoms, including polyuria, polydipsia, polyphagia, nocturia, blurred vision, neuropathy . Current exercise: moderate walking . Current blood glucose readings: 120 range  Pharmacist Clinical Goal(s):  .Marland KitchenOver the next 30 days, patient will work with PharmD and primary care provider to address sweating of unknown etiology, potentially hypglycemia  Interventions: . Comprehensive medication review performed, medication list updated in electronic medical record . Inter-disciplinary care team collaboration (see longitudinal plan of care) . Now taking metformin as prescribed twice daily due to possible hypoglycemia  Patient Self Care Activities:  . Patient will check blood glucose when sweating , document, and provide at future appointments . Patient will focus on medication adherence by continuing to take metformin as directed . Patient will contact provider with any episodes of hypoglycemia . Patient will report any questions or concerns to provider   Initial goal documentation     . DIET - EAT MORE FRUITS AND VEGETABLES    . DIET - INCREASE WATER INTAKE     Recommend drinking 6-8  glasses of water per day     . Hyperlipidemia - goal LDL < 70     CARE PLAN ENTRY (see longitudinal plan of care for additional care plan information)  Current Barriers:  . Uncontrolled hyperlipidemia, complicated by DM, HTN . Current antihyperlipidemic regimen: Lipitor 866mdaily . Previous antihyperlipidemic medications tried Crestor . Most recent lipid panel:     Component  Value Date/Time   CHOL 123 03/07/2019 0000   TRIG 89 03/07/2019 0000   HDL 30 (L) 03/07/2019 0000   CHOLHDL 4.1 03/07/2019 0000   VLDL 13 05/13/2016 0941   LDLCALC 76 03/07/2019 0000 .   Marland Kitchen ASCVD risk enhancing conditions: age >79, DM, HTN, CKD, CHF, current smoker second hand  Pharmacist Clinical Goal(s):  Marland Kitchen Over the next 90 days, patient will work with PharmD and providers towards optimized antihyperlipidemic therapy. . Patient typically at goal and willing to return to LDL < 70  Interventions: . Comprehensive medication review performed; medication list updated in electronic medical record.  Bertram Savin care team collaboration (see longitudinal plan of care) . Reduce exposure to second hand smoke since smoking does increase LDL  Patient Self Care Activities:  . Patient will focus on medication adherence by trying to get husband to smoke outside.  Initial goal documentation     . Hypertension - goal BP < 140/90     CARE PLAN ENTRY (see longitudinal plan of care for additional care plan information)  Current Barriers:  . Controlled hypertension, complicated by DM, HLD . Current antihypertensive regimen: losartan 15m daily . Previous antihypertensives tried: NA . Last practice recorded BP readings:  BP Readings from Last 3 Encounters:  07/30/19 120/68  03/27/19 128/84  10/24/18 118/64 .   .Marland KitchenCurrent home BP readings: near 120/80  Pharmacist Clinical Goal(s):  .Marland KitchenOver the next 90 days, patient will work with PharmD and providers to continue optimized antihypertensive  regimen  Interventions: . Inter-disciplinary care team collaboration (see longitudinal plan of care) . Comprehensive medication review performed; medication list updated in the electronic medical record.  . Counseled on need for ARB, for kidney protection, even when BP is normal  Patient Self Care Activities:  . Patient will continue to check BP daily , document, and provide at future appointments . Patient will focus on medication adherence by taking losartan even when BP seems normal  Initial goal documentation       Depression Screen PHQ 2/9 Scores 04/01/2020 01/29/2020 07/30/2019 03/27/2019 01/26/2019 10/24/2018 06/27/2018  PHQ - 2 Score 0 1 0 0 0 0 0  PHQ- 9 Score - 5 0 - 0 0 0    Fall Risk Fall Risk  04/01/2020 01/29/2020 07/30/2019 03/27/2019 01/26/2019  Falls in the past year? 0 0 0 0 0  Number falls in past yr: 0 0 0 0 0  Injury with Fall? 0 0 0 0 0  Comment - - - - -  Risk for fall due to : No Fall Risks - - - -  Follow up Falls prevention discussed - - Falls prevention discussed -    Any stairs in or around the home? Yes  If so, are there any without handrails? No  Home free of loose throw rugs in walkways, pet beds, electrical cords, etc? Yes  Adequate lighting in your home to reduce risk of falls? Yes   ASSISTIVE DEVICES UTILIZED TO PREVENT FALLS:  Life alert? No  Use of a cane, walker or w/c? No  Grab bars in the bathroom? Yes  Shower chair or bench in shower? Yes  Elevated toilet seat or a handicapped toilet? Yes   TIMED UP AND GO:  Was the test performed? Yes .  Length of time to ambulate 10 feet: 5 sec.   Gait steady and fast without use of assistive device  Cognitive Function:     6CIT Screen 04/01/2020 03/27/2019 03/24/2018  What Year?  0 points 0 points 0 points  What month? 0 points 0 points 0 points  What time? 0 points 0 points 0 points  Count back from 20 0 points 0 points 0 points  Months in reverse 0 points 0 points 0 points  Repeat phrase 0  points 0 points 4 points  Total Score 0 0 4    Immunizations Immunization History  Administered Date(s) Administered  . Fluad Quad(high Dose 65+) 03/07/2019, 04/01/2020  . Influenza Split 01/30/2014  . Influenza, High Dose Seasonal PF 02/12/2015, 05/13/2016, 03/23/2017, 03/21/2018  . PFIZER SARS-COV-2 Vaccination 06/29/2019, 07/20/2019, 03/01/2020  . Pneumococcal Conjugate-13 08/08/2014  . Pneumococcal Polysaccharide-23 05/27/2010, 09/10/2015  . Tdap 05/27/2010  . Zoster 05/31/2011    TDAP status: Up to date   Flu Vaccine status: Completed at today's visit   Pneumococcal vaccine status: Up to date   Covid-19 vaccine status: Completed vaccines  Qualifies for Shingles Vaccine? Yes   Zostavax completed Yes   Shingrix Completed?: No.    Education has been provided regarding the importance of this vaccine. Patient has been advised to call insurance company to determine out of pocket expense if they have not yet received this vaccine. Advised may also receive vaccine at local pharmacy or Health Dept. Verbalized acceptance and understanding.  Screening Tests Health Maintenance  Topic Date Due  . COLONOSCOPY  01/20/2020  . TETANUS/TDAP  05/27/2020  . OPHTHALMOLOGY EXAM  07/26/2020  . HEMOGLOBIN A1C  07/31/2020  . MAMMOGRAM  12/05/2020  . FOOT EXAM  01/28/2021  . INFLUENZA VACCINE  Completed  . DEXA SCAN  Completed  . COVID-19 Vaccine  Completed  . Hepatitis C Screening  Completed  . PNA vac Low Risk Adult  Completed    Health Maintenance  Health Maintenance Due  Topic Date Due  . COLONOSCOPY  01/20/2020    Colorectal cancer screening: Completed 01/20/15. Repeat every 5 years   Mammogram status: Completed 12/06/19. Repeat every year   Bone Density status: Completed 09/06/13. Results reflect: Bone density results: NORMAL. Repeat every 2 years.  Lung Cancer Screening: (Low Dose CT Chest recommended if Age 36-80 years, 30 pack-year currently smoking OR have quit w/in  15years.) does not qualify.   Additional Screening:  Hepatitis C Screening: does qualify; Completed 03/07/12  Vision Screening: Recommended annual ophthalmology exams for early detection of glaucoma and other disorders of the eye. Is the patient up to date with their annual eye exam?  Yes  Who is the provider or what is the name of the office in which the patient attends annual eye exams? [place] Screening: Recommended annual dental exams for proper oral hygiene  Community Resource Referral / Chronic Care Management: CRR required this visit?  No   CCM required this visit?  No      Plan:     I have personally reviewed and noted the following in the patient's chart:   . Medical and social history . Use of alcohol, tobacco or illicit drugs  . Current medications and supplements . Functional ability and status . Nutritional status . Physical activity . Advanced directives . List of other physicians . Hospitalizations, surgeries, and ER visits in previous 12 months . Vitals . Screenings to include cognitive, depression, and falls . Referrals and appointments  In addition, I have reviewed and discussed with patient certain preventive protocols, quality metrics, and best practice recommendations. A written personalized care plan for preventive services as well as general preventive health recommendations were provided  to patient.     Clemetine Marker, LPN   [id number]/[id number]   Nurse Notes: pt has small white colored knot on right index finger that she has had for the past 2-3 months and discussed with Dr. Ancil Boozer before but states it does not cause pain and has not changed but would like to keep an eye on it.

## 2020-04-01 NOTE — Patient Instructions (Signed)
Megan Short , Thank you for taking time to come for your Medicare Wellness Visit. I appreciate your ongoing commitment to your health goals. Please review the following plan we discussed and let me know if I can assist you in the future.   Screening recommendations/referrals: Colonoscopy: done 01/20/15. Scheduled with [facility].  Mammogram: done 12/06/19 Bone Density: done 09/06/13 Recommended yearly ophthalmology/optometry visit for glaucoma screening and checkup Recommended yearly dental visit for hygiene and checkup  Vaccinations: Influenza vaccine: done today Pneumococcal vaccine: done 09/10/15 Tdap vaccine: done 05/27/10 Shingles vaccine: Shingrix discussed. Please contact your pharmacy for coverage information.  Covid-19: done 06/29/19, 07/20/19, 03/01/20  Advanced directives: Advance directive discussed with you today. I have provided a copy for you to complete at home and have notarized. Once this is complete please bring a copy in to our office so we can scan it into your chart.  Conditions/risks identified: recommend drinking 6-8 glasses of water per day   Next appointment: Follow up in one year for your annual wellness visit    Preventive Care 65 Years and Older, Female Preventive care refers to lifestyle choices and visits with your health care provider that can promote health and wellness. What does preventive care include?  A yearly physical exam. This is also called an annual well check.  Dental exams once or twice a year.  Routine eye exams. Ask your health care provider how often you should have your eyes checked.  Personal lifestyle choices, including:  Daily care of your teeth and gums.  Regular physical activity.  Eating a healthy diet.  Avoiding tobacco and drug use.  Limiting alcohol use.  Practicing safe sex.  Taking low-dose aspirin every day.  Taking vitamin and mineral supplements as recommended by your health care provider. What happens during an  annual well check? The services and screenings done by your health care provider during your annual well check will depend on your age, overall health, lifestyle risk factors, and family history of disease. Counseling  Your health care provider may ask you questions about your:  Alcohol use.  Tobacco use.  Drug use.  Emotional well-being.  Home and relationship well-being.  Sexual activity.  Eating habits.  History of falls.  Memory and ability to understand (cognition).  Work and work Astronomer.  Reproductive health. Screening  You may have the following tests or measurements:  Height, weight, and BMI.  Blood pressure.  Lipid and cholesterol levels. These may be checked every 5 years, or more frequently if you are over 58 years old.  Skin check.  Lung cancer screening. You may have this screening every year starting at age 59 if you have a 30-pack-year history of smoking and currently smoke or have quit within the past 15 years.  Fecal occult blood test (FOBT) of the stool. You may have this test every year starting at age 63.  Flexible sigmoidoscopy or colonoscopy. You may have a sigmoidoscopy every 5 years or a colonoscopy every 10 years starting at age 93.  Hepatitis C blood test.  Hepatitis B blood test.  Sexually transmitted disease (STD) testing.  Diabetes screening. This is done by checking your blood sugar (glucose) after you have not eaten for a while (fasting). You may have this done every 1-3 years.  Bone density scan. This is done to screen for osteoporosis. You may have this done starting at age 28.  Mammogram. This may be done every 1-2 years. Talk to your health care provider about how often  you should have regular mammograms. Talk with your health care provider about your test results, treatment options, and if necessary, the need for more tests. Vaccines  Your health care provider may recommend certain vaccines, such as:  Influenza  vaccine. This is recommended every year.  Tetanus, diphtheria, and acellular pertussis (Tdap, Td) vaccine. You may need a Td booster every 10 years.  Zoster vaccine. You may need this after age 5.  Pneumococcal 13-valent conjugate (PCV13) vaccine. One dose is recommended after age 32.  Pneumococcal polysaccharide (PPSV23) vaccine. One dose is recommended after age 19. Talk to your health care provider about which screenings and vaccines you need and how often you need them. This information is not intended to replace advice given to you by your health care provider. Make sure you discuss any questions you have with your health care provider. Document Released: 06/20/2015 Document Revised: 02/11/2016 Document Reviewed: 03/25/2015 Elsevier Interactive Patient Education  2017 [place] Prevention in the Home Falls can cause injuries. They can happen to people of all ages. There are many things you can do to make your home safe and to help prevent falls. What can I do on the outside of my home?  Regularly fix the edges of walkways and driveways and fix any cracks.  Remove anything that might make you trip as you walk through a door, such as a raised step or threshold.  Trim any bushes or trees on the path to your home.  Use bright outdoor lighting.  Clear any walking paths of anything that might make someone trip, such as rocks or tools.  Regularly check to see if handrails are loose or broken. Make sure that both sides of any steps have handrails.  Any raised decks and porches should have guardrails on the edges.  Have any leaves, snow, or ice cleared regularly.  Use sand or salt on walking paths during winter.  Clean up any spills in your garage right away. This includes oil or grease spills. What can I do in the bathroom?  Use night lights.  Install grab bars by the toilet and in the tub and shower. Do not use towel bars as grab bars.  Use non-skid mats or decals  in the tub or shower.  If you need to sit down in the shower, use a plastic, non-slip stool.  Keep the floor dry. Clean up any water that spills on the floor as soon as it happens.  Remove soap buildup in the tub or shower regularly.  Attach bath mats securely with double-sided non-slip rug tape.  Do not have throw rugs and other things on the floor that can make you trip. What can I do in the bedroom?  Use night lights.  Make sure that you have a light by your bed that is easy to reach.  Do not use any sheets or blankets that are too big for your bed. They should not hang down onto the floor.  Have a firm chair that has side arms. You can use this for support while you get dressed.  Do not have throw rugs and other things on the floor that can make you trip. What can I do in the kitchen?  Clean up any spills right away.  Avoid walking on wet floors.  Keep items that you use a lot in easy-to-reach places.  If you need to reach something above you, use a strong step stool that has a grab bar.  Keep electrical cords  out of the way.  Do not use floor polish or wax that makes floors slippery. If you must use wax, use non-skid floor wax.  Do not have throw rugs and other things on the floor that can make you trip. What can I do with my stairs?  Do not leave any items on the stairs.  Make sure that there are handrails on both sides of the stairs and use them. Fix handrails that are broken or loose. Make sure that handrails are as long as the stairways.  Check any carpeting to make sure that it is firmly attached to the stairs. Fix any carpet that is loose or worn.  Avoid having throw rugs at the top or bottom of the stairs. If you do have throw rugs, attach them to the floor with carpet tape.  Make sure that you have a light switch at the top of the stairs and the bottom of the stairs. If you do not have them, ask someone to add them for you. What else can I do to help  prevent falls?  Wear shoes that:  Do not have high heels.  Have rubber bottoms.  Are comfortable and fit you well.  Are closed at the toe. Do not wear sandals.  If you use a stepladder:  Make sure that it is fully opened. Do not climb a closed stepladder.  Make sure that both sides of the stepladder are locked into place.  Ask someone to hold it for you, if possible.  Clearly mark and make sure that you can see:  Any grab bars or handrails.  First and last steps.  Where the edge of each step is.  Use tools that help you move around (mobility aids) if they are needed. These include:  Canes.  Walkers.  Scooters.  Crutches.  Turn on the lights when you go into a dark area. Replace any light bulbs as soon as they burn out.  Set up your furniture so you have a clear path. Avoid moving your furniture around.  If any of your floors are uneven, fix them.  If there are any pets around you, be aware of where they are.  Review your medicines with your doctor. Some medicines can make you feel dizzy. This can increase your chance of falling. Ask your doctor what other things that you can do to help prevent falls. This information is not intended to replace advice given to you by your health care provider. Make sure you discuss any questions you have with your health care provider. Document Released: 03/20/2009 Document Revised: 10/30/2015 Document Reviewed: 06/28/2014 Elsevier Interactive Patient Education  2017 [employer].

## 2020-04-11 DIAGNOSIS — Z8371 Family history of colonic polyps: Secondary | ICD-10-CM | POA: Diagnosis not present

## 2020-04-11 DIAGNOSIS — R7401 Elevation of levels of liver transaminase levels: Secondary | ICD-10-CM | POA: Diagnosis not present

## 2020-05-05 ENCOUNTER — Other Ambulatory Visit: Payer: Self-pay

## 2020-05-05 ENCOUNTER — Other Ambulatory Visit
Admission: RE | Admit: 2020-05-05 | Discharge: 2020-05-05 | Disposition: A | Discharge status: Home / Self Care | Payer: Medicare Other | Source: Ambulatory Visit | Attending: Internal Medicine | Admitting: Internal Medicine

## 2020-05-05 DIAGNOSIS — Z01812 Encounter for preprocedural laboratory examination: Secondary | ICD-10-CM | POA: Diagnosis not present

## 2020-05-05 DIAGNOSIS — Z20822 Contact with and (suspected) exposure to covid-19: Secondary | ICD-10-CM | POA: Diagnosis not present

## 2020-05-05 LAB — SARS CORONAVIRUS 2 (TAT 6-24 HRS): SARS Coronavirus 2: NEGATIVE

## 2020-05-06 ENCOUNTER — Encounter: Payer: Self-pay | Admitting: Internal Medicine

## 2020-05-07 ENCOUNTER — Ambulatory Visit
Admission: RE | Admit: 2020-05-07 | Discharge: 2020-05-07 | Disposition: A | Discharge status: Home / Self Care | Payer: Medicare Other | Attending: Internal Medicine | Admitting: Internal Medicine

## 2020-05-07 ENCOUNTER — Encounter
Admission: RE | Disposition: A | Discharge status: Home / Self Care | Payer: Self-pay | Source: Home / Self Care | Attending: Internal Medicine

## 2020-05-07 ENCOUNTER — Ambulatory Visit: Payer: Medicare Other | Admitting: Anesthesiology

## 2020-05-07 ENCOUNTER — Encounter: Payer: Self-pay | Admitting: Internal Medicine

## 2020-05-07 DIAGNOSIS — E1129 Type 2 diabetes mellitus with other diabetic kidney complication: Secondary | ICD-10-CM | POA: Diagnosis not present

## 2020-05-07 DIAGNOSIS — Z7982 Long term (current) use of aspirin: Secondary | ICD-10-CM | POA: Insufficient documentation

## 2020-05-07 DIAGNOSIS — Z79899 Other long term (current) drug therapy: Secondary | ICD-10-CM | POA: Diagnosis not present

## 2020-05-07 DIAGNOSIS — Z7984 Long term (current) use of oral hypoglycemic drugs: Secondary | ICD-10-CM | POA: Diagnosis not present

## 2020-05-07 DIAGNOSIS — I10 Essential (primary) hypertension: Secondary | ICD-10-CM | POA: Diagnosis not present

## 2020-05-07 DIAGNOSIS — Z882 Allergy status to sulfonamides status: Secondary | ICD-10-CM | POA: Diagnosis not present

## 2020-05-07 DIAGNOSIS — Z79890 Hormone replacement therapy (postmenopausal): Secondary | ICD-10-CM | POA: Insufficient documentation

## 2020-05-07 DIAGNOSIS — Z1211 Encounter for screening for malignant neoplasm of colon: Secondary | ICD-10-CM | POA: Diagnosis not present

## 2020-05-07 DIAGNOSIS — K640 First degree hemorrhoids: Secondary | ICD-10-CM | POA: Diagnosis not present

## 2020-05-07 DIAGNOSIS — Z8371 Family history of colonic polyps: Secondary | ICD-10-CM | POA: Diagnosis not present

## 2020-05-07 DIAGNOSIS — E039 Hypothyroidism, unspecified: Secondary | ICD-10-CM | POA: Diagnosis not present

## 2020-05-07 DIAGNOSIS — F418 Other specified anxiety disorders: Secondary | ICD-10-CM | POA: Diagnosis not present

## 2020-05-07 HISTORY — PX: COLONOSCOPY WITH PROPOFOL: SHX5780

## 2020-05-07 LAB — GLUCOSE, CAPILLARY: Glucose-Capillary: 128 mg/dL — ABNORMAL HIGH (ref 70–99)

## 2020-05-07 SURGERY — COLONOSCOPY WITH PROPOFOL
Anesthesia: General

## 2020-05-07 MED ORDER — SODIUM CHLORIDE 0.9 % IV SOLN
INTRAVENOUS | Status: DC
  Administered 2020-05-07: 1000 mL via INTRAVENOUS

## 2020-05-07 MED ORDER — PROPOFOL 500 MG/50ML IV EMUL
INTRAVENOUS | Status: DC | PRN
  Administered 2020-05-07: 155 ug/kg/min via INTRAVENOUS

## 2020-05-07 MED ORDER — PROPOFOL 10 MG/ML IV BOLUS
INTRAVENOUS | Status: DC | PRN
  Administered 2020-05-07: 10 mg via INTRAVENOUS

## 2020-05-07 MED ORDER — LIDOCAINE HCL (CARDIAC) PF 100 MG/5ML IV SOSY
PREFILLED_SYRINGE | INTRAVENOUS | Status: DC | PRN
  Administered 2020-05-07: 20 mg via INTRAVENOUS

## 2020-05-07 NOTE — Anesthesia Procedure Notes (Signed)
Procedure Name: General with mask airway Performed by: Fletcher-Harrison, Florrie Ramires, CRNA Pre-anesthesia Checklist: Patient identified, Emergency Drugs available, Suction available and Patient being monitored Patient Re-evaluated:Patient Re-evaluated prior to induction Oxygen Delivery Method: Simple face mask Induction Type: IV induction Placement Confirmation: positive ETCO2 and CO2 detector Dental Injury: Teeth and Oropharynx as per pre-operative assessment        

## 2020-05-07 NOTE — Anesthesia Postprocedure Evaluation (Signed)
Anesthesia Post Note  Patient: Megan Short  Procedure(s) Performed: COLONOSCOPY WITH PROPOFOL (N/A )  Patient location during evaluation: PACU Anesthesia Type: General Level of consciousness: awake and awake and alert Pain management: pain level controlled Vital Signs Assessment: post-procedure vital signs reviewed and stable Respiratory status: spontaneous breathing Cardiovascular status: stable Postop Assessment: no apparent nausea or vomiting Anesthetic complications: no   No complications documented.   Last Vitals:  Vitals:   05/07/20 1242 05/07/20 1425  BP: (!) 169/90 119/89  Pulse: 92   Resp: 17   Temp: 36.7 C 36.5 C  SpO2: 98%     Last Pain:  Vitals:   05/07/20 1445  TempSrc:   PainSc: 0-No pain                 Emilio Math

## 2020-05-07 NOTE — H&P (Signed)
  Outpatient short stay form Pre-procedure 05/07/2020 1:51 PM Anibal Quinby K. Alice Reichert, M.D.  Primary Physician: [name], M.D.  Reason for visit: Family history of colon polyps  History of present illness:   74 year old patient presenting for family history of colon polyps. Patient denies any change in bowel habits, rectal bleeding or involuntary weight loss.     Current Facility-Administered Medications:  .  0.9 %  sodium chloride infusion, , Intravenous, Continuous, [place], Benay Pike, MD, Last Rate: 20 mL/hr at 05/07/20 1329, Continued from Pre-op at 05/07/20 1329  Medications Prior to Admission  Medication Sig Dispense Refill Last Dose  . aspirin 81 MG tablet Take by mouth.   Past Week at Unknown time  . atorvastatin (LIPITOR) 80 MG tablet Take 1 tablet (80 mg total) by mouth every evening. 90 tablet 1 Past Week at Unknown time  . blood glucose meter kit and supplies Dispense based on patient and insurance preference. Check bid daily as directed. (FOR ICD-10 E10.9, E11.9). 1 each 0 Past Week at Unknown time  . citalopram (CELEXA) 40 MG tablet Take 1 tablet (40 mg total) by mouth daily. 90 tablet 1 Past Week at Unknown time  . clonazePAM (KLONOPIN) 0.5 MG tablet Take 1 tablet (0.5 mg total) by mouth 2 (two) times daily as needed. 30 tablet 0 Past Week at Unknown time  . glucose blood (ACCU-CHEK GUIDE) test strip USE TO CHECK BLOOD SUGAR TWICE DAILY AS DIRECTED 100 each 5 Past Week at Unknown time  . glucose blood (ACCU-CHEK GUIDE) test strip USE TO CHECK BLOOD SUGAR DAILY AS DIRECTED  DX CODE:Controlled type 2 diabetes mellitus with microalbuminuria, without long-term current use of insulin (HCC)  E11.29, R80.9 100 each 5 Past Week at Unknown time  . levothyroxine (SYNTHROID) 75 MCG tablet Take 1 tablet (75 mcg total) by mouth daily before breakfast. 90 tablet 1 Past Week at Unknown time  . losartan (COZAAR) 100 MG tablet Take 1 tablet (100 mg total) by mouth daily. 90 tablet 1 05/07/2020  at 0600am  . metFORMIN (GLUCOPHAGE-XR) 750 MG 24 hr tablet Take 1 tablet (750 mg total) by mouth 2 (two) times daily. 180 tablet 1 Past Week at Unknown time  . loratadine (CLARITIN) 10 MG tablet Take 1 tablet (10 mg total) by mouth daily. 90 tablet 1      Allergies  Allergen Reactions  . Sulfa Antibiotics      Past Medical History:  Diagnosis Date  . Anxiety   . Depression   . Diabetes mellitus without complication ([place])   . Hyperlipidemia   . Hypertension   . Hypothyroidism     Review of systems:  Otherwise negative.    Physical Exam  Gen: Alert, oriented. Appears stated age.  HEENT: [place]/AT. PERRLA. Lungs: CTA, no wheezes. CV: RR nl S1, S2. Abd: soft, benign, no masses. BS+ Ext: No edema. Pulses 2+    Planned procedures: Proceed with colonoscopy. The patient understands the nature of the planned procedure, indications, risks, alternatives and potential complications including but not limited to bleeding, infection, perforation, damage to internal organs and possible oversedation/side effects from anesthesia. The patient agrees and gives consent to proceed.  Please refer to procedure notes for findings, recommendations and patient disposition/instructions.     Avynn Klassen K. Alice Reichert, M.D. Gastroenterology 05/07/2020  1:51 PM

## 2020-05-07 NOTE — Transfer of Care (Signed)
Immediate Anesthesia Transfer of Care Note  Patient: Megan Short  Procedure(s) Performed: COLONOSCOPY WITH PROPOFOL (N/A )  Patient Location: Endoscopy Unit  Anesthesia Type:General  Level of Consciousness: drowsy and responds to stimulation  Airway & Oxygen Therapy: Patient Spontanous Breathing and Patient connected to face mask oxygen  Post-op Assessment: Report given to RN and Post -op Vital signs reviewed and stable  Post vital signs: Reviewed and stable  Last Vitals:  Vitals Value Taken Time  BP 119/89 05/07/20 1425  Temp 36.5 C 05/07/20 1425  Pulse 76 05/07/20 1428  Resp 17 05/07/20 1428  SpO2 100 % 05/07/20 1428  Vitals shown include unvalidated device data.  Last Pain:  Vitals:   05/07/20 1425  TempSrc: Temporal  PainSc: 0-No pain      Patients Stated Pain Goal: 0 (05/07/20 1242)  Complications: No complications documented.

## 2020-05-07 NOTE — Anesthesia Preprocedure Evaluation (Signed)
Anesthesia Evaluation  Patient identified by MRN, date of birth, ID band Patient awake    Reviewed: Allergy & Precautions, NPO status , Patient's Chart, lab work & pertinent test results  Airway Mallampati: II       Dental no notable dental hx. (+) Teeth Intact   Pulmonary neg pulmonary ROS,    breath sounds clear to auscultation       Cardiovascular hypertension, negative cardio ROS   Rhythm:Regular Rate:Normal     Neuro/Psych PSYCHIATRIC DISORDERS Anxiety Depression negative neurological ROS     GI/Hepatic negative GI ROS, Neg liver ROS,   Endo/Other  diabetesHypothyroidism   Renal/GU Renal disease  negative genitourinary   Musculoskeletal negative musculoskeletal ROS (+)   Abdominal   Peds negative pediatric ROS (+)  Hematology negative hematology ROS (+)   Anesthesia Other Findings Past Medical History: No date: Anxiety No date: Depression No date: Diabetes mellitus without complication (HCC) No date: Hyperlipidemia No date: Hypertension No date: Hypothyroidism   Reproductive/Obstetrics negative OB ROS                             Anesthesia Physical Anesthesia Plan  ASA: III  Anesthesia Plan: General   Post-op Pain Management:    Induction: Intravenous  PONV Risk Score and Plan: 3 and Propofol infusion  Airway Management Planned: Nasal Cannula  Additional Equipment:   Intra-op Plan:   Post-operative Plan:   Informed Consent: I have reviewed the patients History and Physical, chart, labs and discussed the procedure including the risks, benefits and alternatives for the proposed anesthesia with the patient or authorized representative who has indicated his/her understanding and acceptance.       Plan Discussed with: CRNA, Anesthesiologist and Surgeon  Anesthesia Plan Comments:         Anesthesia Quick Evaluation

## 2020-05-07 NOTE — Op Note (Signed)
[facility] Gastroenterology Patient Name: Laykin Rainone Procedure Date: 05/07/2020 1:47 PM MRN: [id number] Account #: [id number] Date of Birth: February 10, 1946 Admit Type: Outpatient Age: 74 Room: [facility] ENDO ROOM 2 Gender: Female Note Status: Finalized Procedure:             Colonoscopy Indications:           Colon cancer screening in patient at increased risk:                         Family history of 1st-degree relative with colon polyps Providers:             Boykin Nearing. Delorean Knutzen MD, MD Medicines:             Propofol per Anesthesia Complications:         No immediate complications. Procedure:             Pre-Anesthesia Assessment:                        - The risks and benefits of the procedure and the                         sedation options and risks were discussed with the                         patient. All questions were answered and informed                         consent was obtained.                        - Patient identification and proposed procedure were                         verified prior to the procedure by the nurse. The                         procedure was verified in the procedure room.                        - ASA Grade Assessment: III - A patient with severe                         systemic disease.                        - After reviewing the risks and benefits, the patient                         was deemed in satisfactory condition to undergo the                         procedure.                        After obtaining informed consent, the colonoscope was                         passed under direct vision. Throughout the procedure,  the patient's blood pressure, pulse, and oxygen                         saturations were monitored continuously. The                         Colonoscope was introduced through the anus and                         advanced to the the cecum, identified by appendiceal                          orifice and ileocecal valve. The colonoscopy was                         technically difficult and complex due to a redundant                         colon. Successful completion of the procedure was                         aided by applying abdominal pressure. The patient                         tolerated the procedure well. The quality of the bowel                         preparation was adequate. The ileocecal valve,                         appendiceal orifice, and rectum were photographed. Findings:      The perianal and digital rectal examinations were normal. Pertinent       negatives include normal sphincter tone and no palpable rectal lesions.      The colon (entire examined portion) was moderately tortuous. Advancing       the scope required changing the patient's position.      Normal mucosa was found in the entire colon.      Non-bleeding internal hemorrhoids were found during retroflexion. The       hemorrhoids were Grade I (internal hemorrhoids that do not prolapse).      The exam was otherwise without abnormality. Impression:            - Tortuous colon.                        - Normal mucosa in the entire examined colon.                        - Non-bleeding internal hemorrhoids.                        - The examination was otherwise normal.                        - No specimens collected. Recommendation:        - Patient has a contact number available for                         emergencies. The  signs and symptoms of potential                         delayed complications were discussed with the patient.                         Return to normal activities tomorrow. Written                         discharge instructions were provided to the patient.                        - Resume previous diet.                        - Continue present medications.                        - No repeat colonoscopy due to current age (55 years                         or older) and the absence  of colonic polyps.                        - You do NOT require further colon cancer screening                         measures (Annual stool testing (i.e. hemoccult, FIT,                         cologuard), sigmoidoscopy, colonoscopy or CT                         colonography). You should share this recommendation                         with your Primary Care provider.                        - Return to GI office PRN.                        - The findings and recommendations were discussed with                         the patient. Procedure Code(s):     --- Professional ---                        S0109, Colorectal cancer screening; colonoscopy on                         individual at high risk Diagnosis Code(s):     --- Professional ---                        Q43.8, Other specified congenital malformations of                         intestine  K64.0, First degree hemorrhoids                        Z83.71, Family history of colonic polyps CPT copyright 2019 American Medical Association. All rights reserved. The codes documented in this report are preliminary and upon coder review may  be revised to meet current compliance requirements. Stanton Kidneyeodoro K Dolton Shaker MD, MD 05/07/2020 2:26:04 PM This report has been signed electronically. Number of Addenda: 0 Note Initiated On: 05/07/2020 1:47 PM Scope Withdrawal Time: 0 hours 4 minutes 58 seconds  Total Procedure Duration: 0 hours 15 minutes 0 seconds  Estimated Blood Loss:  Estimated blood loss: none.      [facility]

## 2020-05-07 NOTE — Interval H&P Note (Signed)
History and Physical Interval Note:  05/07/2020 1:59 PM  Megan Short  has presented today for surgery, with the diagnosis of FM HX COLON POLYPS.  The various methods of treatment have been discussed with the patient and family. After consideration of risks, benefits and other options for treatment, the patient has consented to  Procedure(s): COLONOSCOPY WITH PROPOFOL (N/A) as a surgical intervention.  The patient's history has been reviewed, patient examined, no change in status, stable for surgery.  I have reviewed the patient's chart and labs.  Questions were answered to the patient's satisfaction.     George West, [place]

## 2020-05-08 ENCOUNTER — Encounter: Payer: Self-pay | Admitting: Internal Medicine

## 2020-05-14 ENCOUNTER — Ambulatory Visit: Payer: Medicare Other

## 2020-05-14 DIAGNOSIS — R809 Proteinuria, unspecified: Secondary | ICD-10-CM

## 2020-05-14 DIAGNOSIS — E1129 Type 2 diabetes mellitus with other diabetic kidney complication: Secondary | ICD-10-CM

## 2020-05-14 DIAGNOSIS — F325 Major depressive disorder, single episode, in full remission: Secondary | ICD-10-CM

## 2020-05-14 NOTE — Patient Instructions (Signed)
Visit Information It was great speaking with you today!  Please let me know if you have any questions about our visit. Goals Addressed            This Visit's Progress   . Diabetes Type 2 - goal A1c < 7%   Not on track    CARE PLAN ENTRY (see longitudinal plan of care for additional care plan information)  Current Barriers:  . Diabetes: mellitus type 2; complicated by chronic medical conditions including HTN, HTN, second hand smoke Lab Results  Component Value Date   HGBA1C 6.5 07/30/2019 .   Lab Results  Component Value Date   CREATININE 1.09 (H) 03/07/2019   CREATININE 1.03 (H) 03/21/2018   CREATININE 1.07 (H) 07/19/2017   . Current antihyperglycemic regimen: metformin 1000mg  every morning . Reports hypoglycemic symptoms, including shaking, sweating . Denies hyperglycemic symptoms, including polyuria, polydipsia, polyphagia, nocturia, blurred vision, neuropathy . Current exercise: moderate walking . Current blood glucose readings: 120 range  Pharmacist Clinical Goal(s):  Over the next 30 days, patient will work with PharmD and primary care provider to address sweating of unknown etiology, potentially hypglycemia  Interventions: . Comprehensive medication review performed, medication list updated in electronic medical record . Inter-disciplinary care team collaboration (see longitudinal plan of care) . Now taking metformin as prescribed twice daily due to possible hypoglycemia  Patient Self Care Activities:  . Patient will check blood glucose when sweating , document, and provide at future appointments . Patient will focus on medication adherence by continuing to take metformin as directed . Patient will contact provider with any episodes of hypoglycemia . Patient will report any questions or concerns to provider     . Hyperlipidemia - goal LDL < 70   On track    CARE PLAN ENTRY (see longitudinal plan of care for additional care plan information)  Current Barriers:   . Uncontrolled hyperlipidemia, complicated by DM, HTN . Current antihyperlipidemic regimen: Lipitor 80mg  daily . Previous antihyperlipidemic medications tried Crestor . Most recent lipid panel:     Component Value Date/Time   CHOL 123 03/07/2019 0000   TRIG 89 03/07/2019 0000   HDL 30 (L) 03/07/2019 0000   CHOLHDL 4.1 03/07/2019 0000   VLDL 13 05/13/2016 0941   LDLCALC 76 03/07/2019 0000 .   14/12/2015 ASCVD risk enhancing conditions: age >19, DM, HTN, CKD, CHF, current smoker second hand  Pharmacist Clinical Goal(s):  Marland Kitchen Over the next 90 days, patient will work with PharmD and providers towards optimized antihyperlipidemic therapy. . Patient typically at goal and willing to return to LDL < 70  Interventions: . Comprehensive medication review performed; medication list updated in electronic medical record.  >73 care team collaboration (see longitudinal plan of care) . Reduce exposure to second hand smoke since smoking does increase LDL  Patient Self Care Activities:  . Patient will focus on medication adherence by trying to get husband to smoke outside.    . Hypertension - goal BP < 140/90   On track    CARE PLAN ENTRY (see longitudinal plan of care for additional care plan information)  Current Barriers:  . Controlled hypertension, complicated by DM, HLD . Current antihypertensive regimen: losartan 100mg  daily . Previous antihypertensives tried: NA . Last practice recorded BP readings:  BP Readings from Last 3 Encounters:  07/30/19 120/68  03/27/19 128/84  10/24/18 118/64 .   08/01/19 Current home BP readings: near 120/80  Pharmacist Clinical Goal(s):  03/29/19 Over the next 90 days,  patient will work with PharmD and providers to continue optimized antihypertensive regimen  Interventions: . Inter-disciplinary care team collaboration (see longitudinal plan of care) . Comprehensive medication review performed; medication list updated in the electronic medical record.   . Counseled on need for ARB, for kidney protection, even when BP is normal  Patient Self Care Activities:  . Patient will continue to check BP daily , document, and provide at future appointments . Patient will focus on medication adherence by taking losartan even when BP seems normal       The patient verbalized understanding of instructions, educational materials, and care plan provided today and agreed to receive a mailed copy of patient instructions, educational materials, and care plan.   Telephone follow up appointment with pharmacy team member scheduled for: 07/16/20 at 2:00 PM  Garey Ham Clinical Pharmacist [facility] [phone]

## 2020-05-14 NOTE — Chronic Care Management (AMB) (Signed)
Chronic Care Management Pharmacy  Name: Megan Short  MRN: [id number] DOB: [birth year]  Chief Complaint/ HPI  Megan Short,  74 y.o. , female presents for their Follow-Up CCM visit with the clinical pharmacist via telephone.  PCP : [name], MD  Their chronic conditions include: Hypertension, Hyperlipidemia, Type 2 Diabetes, Depression, Hypothyroidism  Office Visits: 04/01/20: Patient presented to Clemetine Marker, LPN for AWV.  6/86/16: Patient presented to Dr. Ancil Boozer for follow-up. Metformin increased to 750 mg twice daily.   Consult Visit: 05/07/20: Patient presented for colonoscopy.   Medications: Outpatient Encounter Medications as of 05/14/2020  Medication Sig Note  . aspirin 81 MG tablet Take by mouth.   Marland Kitchen atorvastatin (LIPITOR) 80 MG tablet Take 1 tablet (80 mg total) by mouth every evening.   . blood glucose meter kit and supplies Dispense based on patient and insurance preference. Check bid daily as directed. (FOR ICD-10 E10.9, E11.9).   . citalopram (CELEXA) 40 MG tablet Take 1 tablet (40 mg total) by mouth daily.   . clonazePAM (KLONOPIN) 0.5 MG tablet Take 1 tablet (0.5 mg total) by mouth 2 (two) times daily as needed. 04/01/2020: Rarely  . glucose blood (ACCU-CHEK GUIDE) test strip USE TO CHECK BLOOD SUGAR TWICE DAILY AS DIRECTED   . glucose blood (ACCU-CHEK GUIDE) test strip USE TO CHECK BLOOD SUGAR DAILY AS DIRECTED  DX CODE:Controlled type 2 diabetes mellitus with microalbuminuria, without long-term current use of insulin (HCC)  E11.29, R80.9   . levothyroxine (SYNTHROID) 75 MCG tablet Take 1 tablet (75 mcg total) by mouth daily before breakfast.   . loratadine (CLARITIN) 10 MG tablet Take 1 tablet (10 mg total) by mouth daily.   Marland Kitchen losartan (COZAAR) 100 MG tablet Take 1 tablet (100 mg total) by mouth daily.   . metFORMIN (GLUCOPHAGE-XR) 750 MG 24 hr tablet Take 1 tablet (750 mg total) by mouth 2 (two) times daily.    No facility-administered encounter medications  on file as of 05/14/2020.   Current Diagnosis/Assessment:  SDOH Interventions     Most Recent Value  SDOH Interventions  Financial Strain Interventions Intervention Not Indicated  Transportation Interventions Intervention Not Indicated     Goals Addressed            This Visit's Progress   . Diabetes Type 2 - goal A1c < 7%   Not on track    [place] (see longitudinal plan of care for additional care plan information)  Current Barriers:  . Diabetes: mellitus type 2; complicated by chronic medical conditions including HTN, HTN, second hand smoke Lab Results  Component Value Date   HGBA1C 6.5 07/30/2019 .   Lab Results  Component Value Date   CREATININE 1.09 (H) 03/07/2019   CREATININE 1.03 (H) 03/21/2018   CREATININE 1.07 (H) 07/19/2017   . Current antihyperglycemic regimen: metformin 1064m every morning . Reports hypoglycemic symptoms, including shaking, sweating . Denies hyperglycemic symptoms, including polyuria, polydipsia, polyphagia, nocturia, blurred vision, neuropathy . Current exercise: moderate walking . Current blood glucose readings: 120 range  Pharmacist Clinical Goal(s):  .Marland KitchenOver the next 30 days, patient will work with PharmD and primary care provider to address sweating of unknown etiology, potentially hypglycemia  Interventions: . Comprehensive medication review performed, medication list updated in electronic medical record . Inter-disciplinary care team collaboration (see longitudinal plan of care) . Now taking metformin as prescribed twice daily due to possible hypoglycemia  Patient Self Care Activities:  . Patient will check blood glucose when sweating ,  document, and provide at future appointments . Patient will focus on medication adherence by continuing to take metformin as directed . Patient will contact provider with any episodes of hypoglycemia . Patient will report any questions or concerns to provider     . Hyperlipidemia - goal  LDL < 70   On track    [place] (see longitudinal plan of care for additional care plan information)  Current Barriers:  . Uncontrolled hyperlipidemia, complicated by DM, HTN . Current antihyperlipidemic regimen: Lipitor 6m daily . Previous antihyperlipidemic medications tried Crestor . Most recent lipid panel:     Component Value Date/Time   CHOL 123 03/07/2019 0000   TRIG 89 03/07/2019 0000   HDL 30 (L) 03/07/2019 0000   CHOLHDL 4.1 03/07/2019 0000   VLDL 13 05/13/2016 0941   LDLCALC 76 03/07/2019 0000 .   .Marland KitchenASCVD risk enhancing conditions: age >>37 DM, HTN, CKD, CHF, current smoker second hand  Pharmacist Clinical Goal(s):  .Marland KitchenOver the next 90 days, patient will work with PharmD and providers towards optimized antihyperlipidemic therapy. . Patient typically at goal and willing to return to LDL < 70  Interventions: . Comprehensive medication review performed; medication list updated in electronic medical record.  .[facility] team collaboration (see longitudinal plan of care) . Reduce exposure to second hand smoke since smoking does increase LDL  Patient Self Care Activities:  . Patient will focus on medication adherence by trying to get husband to smoke outside.    . Hypertension - goal BP < 140/90   On track    [place](see [place] plan of care for additional care plan information)  Current Barriers:  . Controlled hypertension, complicated by DM, HLD . Current antihypertensive regimen: losartan 108mdaily . Previous antihypertensives tried: NA . Last practice recorded BP readings:  BP Readings from Last 3 Encounters:  07/30/19 120/68  03/27/19 128/84  10/24/18 118/64 .   . Marland Kitchenurrent home BP readings: near 120/80  Pharmacist Clinical Goal(s):  . Marland Kitchenver the next 90 days, patient will work with PharmD and providers to continue optimized antihypertensive regimen  Interventions: . Inter-disciplinary care team collaboration (see  longitudinal plan of care) . Comprehensive medication review performed; medication list updated in the electronic medical record.  . Counseled on need for ARB, for kidney protection, even when BP is normal  Patient Self Care Activities:  . Patient will continue to check BP daily , document, and provide at future appointments . Patient will focus on medication adherence by taking losartan even when BP seems normal      Diabetes   Recent Relevant Labs: Lab Results  Component Value Date/Time   HGBA1C 7.5 (A) 01/29/2020 10:30 AM   HGBA1C 6.5 07/30/2019 09:44 AM   HGBA1C 6.4 (H) 03/07/2019 12:00 AM   HGBA1C 6.2 10/24/2018 09:22 AM   HGBA1C 6.2 11/17/2017 09:10 AM   HGBA1C 6.8 09/04/2015 10:23 AM   MICROALBUR 3.3 01/29/2020 10:34 AM   MICROALBUR 1.4 03/07/2019 12:00 AM   MICROALBUR 50 06/12/2015 10:18 AM    Checking BG: Daily   Fasting  HS  8-Dec 183   7-Dec 156   6-Dec 189   5-Dec 188   4-Dec 195   3-Dec 201   2-Dec 198   1-Dec 149   30-Nov 193 104  29-Nov 157   Average 181    Patient is currently uncontrolled on the following medications:   Metformin XR 750 mg twice daily   Last diabetic  Foot exam:  Lab Results  Component Value Date/Time   HMDIABEYEEXA No Retinopathy 07/27/2019 12:00 AM    Last diabetic Eye exam: No results found for: HMDIABFOOTEX   We discussed: Patient has been exercising twice daily, walking or aerobics. However she continues to struggle with her diet, and reports she is not always eating what she should. Last night she had a [employer] Sandwich + Lucendia Herrlich + lemonade. She also ate M&Ms for dessert.   Counseled patient at length on portion control, healthy food selections and PLATE Method   Plan  Recommend increasing Metformin XR to 1000 mg twice daily   Hypertension   BP goal is:  <130/80  Office blood pressures are  BP Readings from Last 3 Encounters:  05/07/20 119/89  04/01/20 140/80  01/29/20 122/70   Patient checks BP at home  infrequently Patient home BP readings are ranging: n/a  Patient has failed these meds in the past: n/a Patient is currently controlled on the following medications:  . Losartan 100 mg daily   We discussed diet and exercise extensively  Plan  Continue current medications     Hyperlipidemia   LDL goal < 70  Last lipids Lab Results  Component Value Date   CHOL 105 01/29/2020   HDL 34 (L) 01/29/2020   LDLCALC 53 01/29/2020   TRIG 98 01/29/2020   CHOLHDL 3.1 01/29/2020   Hepatic Function Latest Ref Rng & Units 01/29/2020 03/07/2019 03/21/2018  Total Protein 6.1 - 8.1 g/dL 7.1 7.2 7.3  Albumin 3.6 - 5.1 g/dL - - -  AST 10 - 35 U/L 52(H) 43(H) 44(H)  ALT 6 - 29 U/L 30(H) 19 22  Alk Phosphatase 33 - 130 U/L - - -  Total Bilirubin 0.2 - 1.2 mg/dL 0.3 0.6 1.0     The ASCVD Risk score Mikey Bussing DC Jr., et al., 2013) failed to calculate for the following reasons:   The valid total cholesterol range is 130 to 320 mg/dL   Patient has failed these meds in past: n/a Patient is currently controlled on the following medications:  . Atorvastatin 80 mg daily   We discussed:  diet and exercise extensively  Plan  Continue current medications  Hypothyroidism   Lab Results  Component Value Date/Time   TSH 1.72 01/29/2020 10:34 AM   TSH 0.68 03/07/2019 12:00 AM    Patient has failed these meds in past: n/a Patient is currently controlled on the following medications:  . Levothyroxine 75 mcg daily   We discussed: Stable, endorses adherence.   Plan  Continue current medications  Depression / Anxiety   PHQ9 Score:  PHQ9 SCORE ONLY 04/01/2020 01/29/2020 07/30/2019  PHQ-9 Total Score 0 5 0   GAD7 Score: GAD 7 : Generalized Anxiety Score 06/27/2018 11/17/2017  Nervous, Anxious, on Edge 1 0  Control/stop worrying 1 1  Worry too much - different things 1 1  Trouble relaxing 0 1  Restless 0 0  Easily annoyed or irritable 1 1  Afraid - awful might happen 1 1  Total GAD 7 Score 5  5  Anxiety Difficulty Not difficult at all -   Patient has failed these meds in past: n/a Patient is currently controlled on the following medications:  . Citalopram 40 mg daily  . Clonazepam 0.5 mg twice daily PRN   We discussed:  Patient's mood stable. Max recommended dose in age >20 20 mg due to risk of QTc prolongation.   Plan  Recommend reducing citalopram to 20 mg daily  to minimize risk of QTc Prolongation   Medication Management   Pt uses [facility] for all medications Uses pill box? Yes Pt endorses 100% compliance  We discussed:   Plan  Continue current medication management strategy  Follow up: 2 month phone visit  [facility] [phone]

## 2020-05-21 ENCOUNTER — Other Ambulatory Visit: Payer: Self-pay | Admitting: Family Medicine

## 2020-05-21 DIAGNOSIS — Z780 Asymptomatic menopausal state: Secondary | ICD-10-CM

## 2020-06-23 ENCOUNTER — Telehealth: Payer: Self-pay

## 2020-06-23 NOTE — Chronic Care Management (AMB) (Signed)
Chronic Care Management Pharmacy Assistant   Name: Megan Short  MRN: [id number] DOB: [birth year]  Reason for Encounter: Disease State - Hypertension Adherence Call.   Patient Questions:   1.  Have you seen any other providers since your last visit?  No   2.  Any changes in your medicines or health? No    PCP : [name], MD  Allergies:   Allergies  Allergen Reactions   Sulfa Antibiotics     Medications: Outpatient Encounter Medications as of 06/23/2020  Medication Sig Note   aspirin 81 MG tablet Take by mouth.    atorvastatin (LIPITOR) 80 MG tablet Take 1 tablet (80 mg total) by mouth every evening.    blood glucose meter kit and supplies Dispense based on patient and insurance preference. Check bid daily as directed. (FOR ICD-10 E10.9, E11.9).    citalopram (CELEXA) 40 MG tablet Take 1 tablet (40 mg total) by mouth daily.    clonazePAM (KLONOPIN) 0.5 MG tablet Take 1 tablet (0.5 mg total) by mouth 2 (two) times daily as needed. 04/01/2020: Rarely   glucose blood (ACCU-CHEK GUIDE) test strip USE TO CHECK BLOOD SUGAR TWICE DAILY AS DIRECTED    glucose blood (ACCU-CHEK GUIDE) test strip USE TO CHECK BLOOD SUGAR DAILY AS DIRECTED  DX CODE:Controlled type 2 diabetes mellitus with microalbuminuria, without long-term current use of insulin (HCC)  E11.29, R80.9    levothyroxine (SYNTHROID) 75 MCG tablet Take 1 tablet (75 mcg total) by mouth daily before breakfast.    loratadine (CLARITIN) 10 MG tablet Take 1 tablet (10 mg total) by mouth daily.    losartan (COZAAR) 100 MG tablet Take 1 tablet (100 mg total) by mouth daily.    metFORMIN (GLUCOPHAGE-XR) 750 MG 24 hr tablet Take 1 tablet (750 mg total) by mouth 2 (two) times daily.    No facility-administered encounter medications on file as of 06/23/2020.    Current Diagnosis: Patient Active Problem List   Diagnosis Date Noted   Umbilical hernia without obstruction and without gangrene 09/13/2016   Diabetes  mellitus with renal manifestations, controlled ([place]) 09/07/2014   Adult hypothyroidism 09/07/2014   Vitamin D deficiency 09/07/2014   Benign essential HTN 09/07/2014   Microalbuminuria 09/07/2014   Dyslipidemia 09/07/2014   Major depression in full remission ([place]) 09/07/2014   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/07/2014   Calculus of kidney 09/07/2014   Memory loss or impairment 09/07/2014   Reviewed chart prior to disease state call. Spoke with patient regarding BP  Recent Office Vitals: BP Readings from Last 3 Encounters:  05/07/20 119/89  04/01/20 140/80  01/29/20 122/70   Pulse Readings from Last 3 Encounters:  05/07/20 92  04/01/20 86  01/29/20 87    Wt Readings from Last 3 Encounters:  05/07/20 145 lb (65.8 kg)  04/01/20 157 lb 6.4 oz (71.4 kg)  01/29/20 155 lb 1.6 oz (70.4 kg)     Kidney Function Lab Results  Component Value Date/Time   CREATININE 0.97 (H) 01/29/2020 10:34 AM   CREATININE 1.09 (H) 03/07/2019 12:00 AM   GFRNONAA 58 (L) 01/29/2020 10:34 AM   GFRAA 67 01/29/2020 10:34 AM    BMP Latest Ref Rng & Units 01/29/2020 03/07/2019 03/21/2018  Glucose 65 - 99 mg/dL 158(H) 120(H) 78  BUN 7 - 25 mg/dL 17 18 20   Creatinine 0.60 - 0.93 mg/dL 0.97(H) 1.09(H) 1.03(H)  BUN/Creat Ratio 6 - 22 (calc) 18 17 19   Sodium 135 - 146 mmol/L 136 141  140  Potassium 3.5 - 5.3 mmol/L 4.7 4.4 4.1  Chloride 98 - 110 mmol/L 104 107 106  CO2 20 - 32 mmol/L 23 28 24   Calcium 8.6 - 10.4 mg/dL 9.7 10.2 10.1     Current antihypertensive regimen:  o Losartan 100 mg one tablet a day o   How often are you checking your Blood Pressure? No   Current home BP readings: None   What recent interventions/DTPs have been made by any provider to improve Blood Pressure control since last CPP Visit: Patient states she has been taking her medication as directed.   Any recent hospitalizations or ED visits since last visit with CPP? No   What diet changes have  been made to improve Blood Pressure Control?  o Patient states she had been watching her salt intake/ sugars, fats .  What exercise is being done to improve your Blood Pressure Control?  o Patient states she has been exercising every day .alos she is doing 2 miles a day on Treadmill.    Adherence Review: Is the patient currently on ACE/ARB medication? Yes  Does the patient have >5 day gap between last estimated fill dates? No   Follow-Up:  Pharmacist Review - Patient states she has been checking her blood sugars one time a day fasting - 06/23/2020 - 115 fasting. States that her blood sugars reading are doing a lot better since she has been watching her diet and exercising. Patient is taking all medications as directed by provider.  Daron Offer CPP Notified  Judithann Sheen, [facility] Clinical Pharmacist Assistant [phone]

## 2020-07-15 ENCOUNTER — Telehealth: Payer: Self-pay

## 2020-07-15 NOTE — Progress Notes (Signed)
Spoke to patient to confirmed patient telephone appointment on 07/16/2020 for CCM at 2:00 pm with Angelena Sole the Clinical pharmacist.   Patient Verbalized understanding Everlean Cherry Clinical Pharmacist Assistant [phone]

## 2020-07-16 ENCOUNTER — Ambulatory Visit (INDEPENDENT_AMBULATORY_CARE_PROVIDER_SITE_OTHER): Payer: Medicare Other

## 2020-07-16 DIAGNOSIS — E785 Hyperlipidemia, unspecified: Secondary | ICD-10-CM | POA: Diagnosis not present

## 2020-07-16 DIAGNOSIS — R809 Proteinuria, unspecified: Secondary | ICD-10-CM

## 2020-07-16 DIAGNOSIS — E1129 Type 2 diabetes mellitus with other diabetic kidney complication: Secondary | ICD-10-CM

## 2020-07-16 DIAGNOSIS — I10 Essential (primary) hypertension: Secondary | ICD-10-CM | POA: Diagnosis not present

## 2020-07-16 NOTE — Patient Instructions (Signed)
Visit Information It was great speaking with you today!  Please let me know if you have any questions about our visit.  Goals Addressed            This Visit's Progress   . Monitor and Manage My Blood Sugar-Diabetes Type 2       Timeframe:  Long-Range Goal Priority:  High Start Date:    07/16/2020                         Expected End Date:  01/15/2021                     Follow Up Date 10/04/2020    - check blood sugar at prescribed times - check blood sugar if I feel it is too high or too low    Why is this important?    Checking your blood sugar at home helps to keep it from getting very high or very low.   Writing the results in a diary or log helps the doctor know how to care for you.   Your blood sugar log should have the time, date and the results.   Also, write down the amount of insulin or other medicine that you take.   Other information, like what you ate, exercise done and how you were feeling, will also be helpful.     Notes:        Patient Care Plan: General Pharmacy (Adult)    Problem Identified: Hypertension, Hyperlipidemia, Diabetes, Hypothyroidism and Depression   Priority: High    Long-Range Goal: Patient-Specific Goal   Start Date: 07/16/2020  Expected End Date: 01/15/2021  This Visit's Progress: On track  Priority: High  Note:   Current Barriers:  . No barriers noted  Pharmacist Clinical Goal(s):  Marland Kitchen Over the next 90 days, patient will maintain control of Diabetes as evidenced by A1c less than 7%  through collaboration with PharmD and provider.   Interventions: . 1:1 collaboration with Alba Cory, MD regarding development and update of comprehensive plan of care as evidenced by provider attestation and co-signature . Inter-disciplinary care team collaboration (see longitudinal plan of care) . Comprehensive medication review performed; medication list updated in electronic medical record  Hypertension (BP goal <140/90) -controlled -Current  treatment: . Losartan 100 mg daily  -Medications previously tried: NA  -Current home readings: NA -Current dietary habits: Cut back significantly on food intake.  -Current exercise habits: recently increased activity, reports walking or  stationary bike, 4-5 times weekly  -Denies hypotensive/hypertensive symptoms -Educated on Daily salt intake goal < 2300 mg; Exercise goal of 150 minutes per week; Importance of home blood pressure monitoring; -Counseled to monitor BP at home weekly, document, and provide log at future appointments -Recommended to continue current medication  Hyperlipidemia: (LDL goal < 100) -controlled -Current treatment: . Atorvastatin 80 mg daily  -Medications previously tried: NA  -Educated on Importance of limiting foods high in cholesterol; -Recommended to continue current medication  Diabetes (A1c goal <7%) -uncontrolled -Current medications: Marland Kitchen Metformin XR 750 mg twice daily  -Medications previously tried: NA  -Current home glucose readings . fasting glucose: 100-120s . post prandial glucose: NA -Denies hypoglycemic/hyperglycemic symptoms -Educated on  A1c and blood sugar goals; Exercise goal of 150 minutes per week; -Counseled to check feet daily and get yearly eye exams -Recommended to continue current medication  Depression/Anxiety (Goal: maintain stable mood) -controlled -Current treatment: . Citalopram 40 mg daily  .  Clonazepam 0.5 mg twice daily as needed -Medications previously tried/failed: NA -PHQ9: 5 -GAD7: NA -Educated on Benefits of medication for symptom control Benefits of cognitive-behavioral therapy with or without medication. Patient not interested in CBT at this time.  -Recommended decreasing citalopram to 20 mg daily to reduce risk of QTc prolongation   Patient Goals/Self-Care Activities . Over the next 90 days, patient will:  - take medications as prescribed check glucose daily (Fasting), document, and provide at future  appointments check blood pressure every 1-2 weeks, document, and provide at future appointments  Follow Up Plan: Telephone follow up appointment with care management team member scheduled for: 01/15/2021 at 1:00 PM    Patient agreed to services and verbal consent obtained.   The patient verbalized understanding of instructions, educational materials, and care plan provided today and declined offer to receive copy of patient instructions, educational materials, and care plan.   Garey Ham Clinical Pharmacist [facility] [phone]

## 2020-07-16 NOTE — Progress Notes (Signed)
Chronic Care Management Pharmacy Note  07/16/2020 Name:  Megan Short MRN:  [id number] DOB:  Oct 17, 1945  Subjective: Megan Short is an 75 y.o. year old female who is a primary patient of [name], MD.  The CCM team was consulted for assistance with disease management and care coordination needs.    Engaged with patient by telephone for follow up visit in response to provider referral for pharmacy case management and/or care coordination services.   Consent to Services:  The patient was given the following information about Chronic Care Management services today, agreed to services, and gave verbal consent: 1. CCM service includes personalized support from designated clinical staff supervised by the primary care provider, including individualized plan of care and coordination with other care providers 2. 24/7 contact phone numbers for assistance for urgent and routine care needs. 3. Service will only be billed when office clinical staff spend 20 minutes or more in a month to coordinate care. 4. Only one practitioner may furnish and bill the service in a calendar month. 5.The patient may stop CCM services at any time (effective at the end of the month) by phone call to the office staff. 6. The patient will be responsible for cost sharing (co-pay) of up to 20% of the service fee (after annual deductible is met). Patient agreed to services and consent obtained.  Patient Care Team: [name], MD as PCP - General (Family Medicine) Gardiner Barefoot, DPM as Consulting Physician (Podiatry) Baxter Kail, MD as Consulting Physician (Dermatology) Germaine Pomfret, [facility] as Pharmacist (Pharmacist)  Recent office visits: 04/01/20: Patient presented to Clemetine Marker, LPN for AWV.  6/86/16: Patient presented to Dr. Ancil Boozer for follow-up. Metformin increased to 750 mg twice daily.   Recent consult visits: None in previous 6 months  Hospital visits: None in previous 6 months  Objective:  Lab  Results  Component Value Date   CREATININE 0.97 (H) 01/29/2020   BUN 17 01/29/2020   GFRNONAA 58 (L) 01/29/2020   GFRAA 67 01/29/2020   NA 136 01/29/2020   K 4.7 01/29/2020   CALCIUM 9.7 01/29/2020   CO2 23 01/29/2020    Lab Results  Component Value Date/Time   HGBA1C 7.5 (A) 01/29/2020 10:30 AM   HGBA1C 6.5 07/30/2019 09:44 AM   HGBA1C 6.4 (H) 03/07/2019 12:00 AM   HGBA1C 6.2 10/24/2018 09:22 AM   HGBA1C 6.2 11/17/2017 09:10 AM   HGBA1C 6.8 09/04/2015 10:23 AM   MICROALBUR 3.3 01/29/2020 10:34 AM   MICROALBUR 1.4 03/07/2019 12:00 AM   MICROALBUR 50 06/12/2015 10:18 AM    Last diabetic Eye exam:  Lab Results  Component Value Date/Time   HMDIABEYEEXA No Retinopathy 07/27/2019 12:00 AM    Last diabetic Foot exam: No results found for: HMDIABFOOTEX   Lab Results  Component Value Date   CHOL 105 01/29/2020   HDL 34 (L) 01/29/2020   LDLCALC 53 01/29/2020   TRIG 98 01/29/2020   CHOLHDL 3.1 01/29/2020    Hepatic Function Latest Ref Rng & Units 01/29/2020 03/07/2019 03/21/2018  Total Protein 6.1 - 8.1 g/dL 7.1 7.2 7.3  Albumin 3.6 - 5.1 g/dL - - -  AST 10 - 35 U/L 52(H) 43(H) 44(H)  ALT 6 - 29 U/L 30(H) 19 22  Alk Phosphatase 33 - 130 U/L - - -  Total Bilirubin 0.2 - 1.2 mg/dL 0.3 0.6 1.0    Lab Results  Component Value Date/Time   TSH 1.72 01/29/2020 10:34 AM   TSH 0.68 03/07/2019 12:00 AM  CBC Latest Ref Rng & Units 01/29/2020 03/07/2019 09/13/2016  WBC 3.8 - 10.8 Thousand/uL 6.6 5.9 6.4  Hemoglobin 11.7 - 15.5 g/dL 13.1 12.6 12.5  Hematocrit 35.0 - 45.0 % 40.1 39.4 39.8  Platelets 140 - 400 Thousand/uL 286 334 301    Lab Results  Component Value Date/Time   VD25OH 36 03/07/2019 12:00 AM   VD25OH 27 (L) 07/19/2017 10:01 AM    Clinical ASCVD: No  The ASCVD Risk score Mikey Bussing DC Jr., et al., 2013) failed to calculate for the following reasons:   The valid total cholesterol range is 130 to 320 mg/dL    Depression screen [facility] 2/9 04/01/2020 01/29/2020 07/30/2019   Decreased Interest 0 0 0  Down, Depressed, Hopeless 0 1 0  PHQ - 2 Score 0 1 0  Altered sleeping - 0 0  Tired, decreased energy - 0 0  Change in appetite - 3 0  Feeling bad or failure about yourself  - 0 0  Trouble concentrating - 0 0  Moving slowly or fidgety/restless - 1 0  Suicidal thoughts - 0 0  PHQ-9 Score - 5 0  Difficult doing work/chores - Not difficult at all Not difficult at all  Some recent data might be hidden     Social History   Tobacco Use  Smoking Status Never Smoker  Smokeless Tobacco Never Used   BP Readings from Last 3 Encounters:  05/07/20 119/89  04/01/20 140/80  01/29/20 122/70   Pulse Readings from Last 3 Encounters:  05/07/20 92  04/01/20 86  01/29/20 87   Wt Readings from Last 3 Encounters:  05/07/20 145 lb (65.8 kg)  04/01/20 157 lb 6.4 oz (71.4 kg)  01/29/20 155 lb 1.6 oz (70.4 kg)    Assessment/Interventions: Review of patient past medical history, allergies, medications, health status, including review of consultants reports, laboratory and other test data, was performed as part of comprehensive evaluation and provision of chronic care management services.   SDOH:  (Social Determinants of Health) assessments and interventions performed: Yes SDOH Interventions   Flowsheet Row Most Recent Value  SDOH Interventions   Financial Strain Interventions Intervention Not Indicated  Transportation Interventions Intervention Not Indicated      CCM Care Plan  Allergies  Allergen Reactions  . Sulfa Antibiotics     Medications Reviewed Today    Reviewed by Geraldo Pitter, RN (Registered Nurse) on 05/07/20 at 1327  Med List Status: <None>  Medication Order Taking? Sig Documenting Provider Last Dose Status Informant  aspirin 81 MG tablet [id number] Yes Take by mouth. [provider] Past Week Unknown time Active            Med Note Clemetine Marker D   Tue Apr 01, 2020 10:04 AM)    atorvastatin (LIPITOR) 80 MG tablet [id number] Yes  Take 1 tablet (80 mg total) by mouth every evening. [name], MD Past Week Unknown time Active   blood glucose meter kit and supplies [id number] Yes Dispense based on patient and insurance preference. Check bid daily as directed. (FOR ICD-10 E10.9, E11.9). [name], MD Past Week Unknown time Active   citalopram (CELEXA) 40 MG tablet [id number] Yes Take 1 tablet (40 mg total) by mouth daily. [name], MD Past Week Unknown time Active   clonazePAM (KLONOPIN) 0.5 MG tablet [id number] Yes Take 1 tablet (0.5 mg total) by mouth 2 (two) times daily as needed. [name], MD Past Week Unknown time Active  Med Note Alleen Borne   Tue Apr 01, 2020 10:04 AM) Rarely  glucose blood (ACCU-CHEK GUIDE) test strip [id number] Yes USE TO CHECK BLOOD SUGAR TWICE DAILY AS DIRECTED [name], MD Past Week Unknown time Active   glucose blood (ACCU-CHEK GUIDE) test strip [id number] Yes USE TO CHECK BLOOD SUGAR DAILY AS DIRECTED  DX CODE:Controlled type 2 diabetes mellitus with microalbuminuria, without long-term current use of insulin (HCC)  E11.29, R80.9 [name], MD Past Week Unknown time Active   levothyroxine (SYNTHROID) 75 MCG tablet [id number] Yes Take 1 tablet (75 mcg total) by mouth daily before breakfast. [name], MD Past Week Unknown time Active   loratadine (CLARITIN) 10 MG tablet [id number]  Take 1 tablet (10 mg total) by mouth daily. [name], MD  Expired 02/28/20 2359   losartan (COZAAR) 100 MG tablet [id number] Yes Take 1 tablet (100 mg total) by mouth daily. [name], MD 05/07/2020 0600am Active   metFORMIN (GLUCOPHAGE-XR) 750 MG 24 hr tablet [id number] Yes Take 1 tablet (750 mg total) by mouth 2 (two) times daily. [name], MD Past Week Unknown time Active           Patient Active Problem List   Diagnosis Date Noted  . Umbilical hernia without obstruction and without gangrene 09/13/2016  . Diabetes mellitus with renal  manifestations, controlled ([place]) 09/07/2014  . Adult hypothyroidism 09/07/2014  . Vitamin D deficiency 09/07/2014  . Benign essential HTN 09/07/2014  . Microalbuminuria 09/07/2014  . Dyslipidemia 09/07/2014  . Major depression in full remission ([place]) 09/07/2014  . 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/07/2014  . Calculus of kidney 09/07/2014  . Memory loss or impairment 09/07/2014    Immunization History  Administered Date(s) Administered  . Fluad Quad(high Dose 65+) 03/07/2019, 04/01/2020  . Influenza Split 01/30/2014  . Influenza, High Dose Seasonal PF 02/12/2015, 05/13/2016, 03/23/2017, 03/21/2018  . PFIZER(Purple Top)SARS-COV-2 Vaccination 06/29/2019, 07/20/2019, 03/01/2020  . Pneumococcal Conjugate-13 08/08/2014  . Pneumococcal Polysaccharide-23 05/27/2010, 09/10/2015  . Tdap 05/27/2010  . Zoster 05/31/2011    Conditions to be addressed/monitored:  Hypertension, Hyperlipidemia, Diabetes, Hypothyroidism and Depression  Care Plan : General Pharmacy (Adult)  Updates made by Germaine Pomfret, RPH since 07/16/2020 12:00 AM    Problem: Hypertension, Hyperlipidemia, Diabetes, Hypothyroidism and Depression   Priority: High    Long-Range Goal: Patient-Specific Goal   Start Date: 07/16/2020  Expected End Date: 01/15/2021  This Visit's Progress: On track  Priority: High  Note:   Current Barriers:  . No barriers noted  Pharmacist Clinical Goal(s):  Marland Kitchen Over the next 90 days, patient will maintain control of Diabetes as evidenced by A1c less than 7%  through collaboration with PharmD and provider.   Interventions: . 1:1 collaboration with [name], MD regarding development and update of comprehensive plan of care as evidenced by provider attestation and co-signature . Inter-disciplinary care team collaboration (see longitudinal plan of care) . Comprehensive medication review performed; medication list updated in electronic medical record  Hypertension (BP  goal <140/90) -controlled -Current treatment: . Losartan 100 mg daily  -Medications previously tried: NA  -Current home readings: NA -Current dietary habits: Cut back significantly on food intake.  -Current exercise habits: recently increased activity, reports walking or  stationary bike, 4-5 times weekly  -Denies hypotensive/hypertensive symptoms -Educated on Daily salt intake goal < 2300 mg; Exercise goal of 150 minutes per week; Importance of home blood pressure monitoring; -Counseled to monitor BP at home weekly, document, and provide log at  future appointments -Recommended to continue current medication  Hyperlipidemia: (LDL goal < 100) -controlled -Current treatment: . Atorvastatin 80 mg daily  -Medications previously tried: NA  -Educated on Importance of limiting foods high in cholesterol; -Recommended to continue current medication  Diabetes (A1c goal <7%) -uncontrolled -Current medications: Marland Kitchen Metformin XR 750 mg twice daily  -Medications previously tried: NA  -Current home glucose readings . fasting glucose: 100-120s . post prandial glucose: NA -Denies hypoglycemic/hyperglycemic symptoms -Educated on  A1c and blood sugar goals; Exercise goal of 150 minutes per week; -Counseled to check feet daily and get yearly eye exams -Recommended to continue current medication  Depression/Anxiety (Goal: maintain stable mood) -controlled -Current treatment: . Citalopram 40 mg daily  . Clonazepam 0.5 mg twice daily as needed -Medications previously tried/failed: NA -PHQ9: 5 -GAD7: NA -Educated on Benefits of medication for symptom control Benefits of cognitive-behavioral therapy with or without medication. Patient not interested in CBT at this time.  -Recommended decreasing citalopram to 20 mg daily to reduce risk of QTc prolongation   Patient Goals/Self-Care Activities . Over the next 90 days, patient will:  - take medications as prescribed check glucose daily  (Fasting), document, and provide at future appointments check blood pressure every 1-2 weeks, document, and provide at future appointments  Follow Up Plan: Telephone follow up appointment with care management team member scheduled for: 01/15/2021 at 1:00 PM      Medication Assistance: None required.  Patient affirms current coverage meets needs.  Patient's preferred pharmacy is:  [facility] [address] - [place] ([place]) [place] [address] Phone: [phone] Fax: [phone]  [id number] [place]: [phone]: [phone] Uses pill box? Yes Pt endorses 100% compliance  We discussed: Current pharmacy is preferred with insurance plan and patient is satisfied with pharmacy services Patient decided to: Continue current medication management strategy  Follow Up:  Patient agrees to Care Plan and Follow-up.  Plan: Telephone follow up appointment with care management team member scheduled for:  01/15/21 at 1:00 PM  [phone]

## 2020-07-28 DIAGNOSIS — H2513 Age-related nuclear cataract, bilateral: Secondary | ICD-10-CM | POA: Diagnosis not present

## 2020-07-28 LAB — HM DIABETES EYE EXAM

## 2020-07-30 NOTE — Progress Notes (Signed)
Name: Megan Short   MRN: [id number]    DOB: 22-Nov-1945   Date:08/01/2020       Progress Note  Subjective  Chief Complaint  Follow up   HPI   HTN: taking medication - Losartan  daily and denies side effects. No chest pain, no palpitation, or orthostatic changes.  Last GFR improved to 67   Hypothyroidism: denies fatigue, hair loss,dry skin, or constipation . She has noticed intermittent dysphagia - feels like she is choking, happening about once a week, discussed [place], but she would like to hold off for now since she has been under more stress. She is taking Levothyroxine as prescribed last TSH at goal. We will recheck labs today    DMII: She is exercising again, eating smaller portions, she is on higher dose of Metformin at 1500 mg  A1C has gone up from 6.5 % to 7.5 % and today is 7.4 % .  She denies polyphagia, polydipsia, or polyuria.. She is taking ARB and last urine micro has improved down from 100but last time it was normal.Seen byNephrologist - Dr. Candiss Norse - in the pasbut has been released. Hyperparathyroidism resolved.     Major Depression: it was caused by death of her son [id number] ago( complications of lupus died at age 62), she is doing well on Celexa and BZD's very seldom - last rx was given one year ago . She denies anhedonia, or crying spells, still has difficulty making decisions ( like picking out an outfit) but not about finances. Her mother died recently phq 13 is 5 she states improving now, she was very upset about one week ago   Dyslipidemia: on Atorvastatin,denies myalgias, doing well on medication. Last LDL was at goal- 53 . Continue medication   Vitamin D deficiency: last labs were at goal, continue supplementation   Hives: doing well since started on loratadine  Excoriation leg: she has a long scratch on left lower leg, not up to date with Tdap, not sure of the trigger but it happened this week.   Patient Active Problem List   Diagnosis Date Noted    Umbilical hernia without obstruction and without gangrene 09/13/2016   Diabetes mellitus with renal manifestations, controlled ([place]) 09/07/2014   Adult hypothyroidism 09/07/2014   Vitamin D deficiency 09/07/2014   Benign essential HTN 09/07/2014   Microalbuminuria 09/07/2014   Dyslipidemia 09/07/2014   Major depression in full remission ([place]) 09/07/2014   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/07/2014   Calculus of kidney 09/07/2014   Memory loss or impairment 09/07/2014    Past Surgical History:  Procedure Laterality Date   BREAST BIOPSY Right    negative times 2   COLONOSCOPY WITH PROPOFOL N/A 01/20/2015   Procedure: COLONOSCOPY WITH PROPOFOL;  Surgeon: Manya Silvas, MD;  Location: [facility];  Service: Endoscopy;  Laterality: N/A;   COLONOSCOPY WITH PROPOFOL N/A 05/07/2020   Procedure: COLONOSCOPY WITH PROPOFOL;  Surgeon: Toledo, Benay Pike, MD;  Location: [facility];  Service: Gastroenterology;  Laterality: N/A;   LITHOTRIPSY Left 12/2013    Family History  Problem Relation Age of Onset   Osteoarthritis Mother    Kidney failure Mother    Failure to thrive Mother    Osteoarthritis Sister    Alzheimer's disease Maternal Aunt    Breast cancer Neg Hx     Social History   Tobacco Use   Smoking status: Never Smoker   Smokeless tobacco: Never Used  Substance Use Topics   Alcohol use: No  Alcohol/week: 0.0 standard drinks     Current Outpatient Medications:    aspirin 81 MG tablet, Take by mouth., Disp: , Rfl:    atorvastatin (LIPITOR) 80 MG tablet, Take 1 tablet (80 mg total) by mouth every evening., Disp: 90 tablet, Rfl: 1   blood glucose meter kit and supplies, Dispense based on patient and insurance preference. Check bid daily as directed. (FOR ICD-10 E10.9, E11.9)., Disp: 1 each, Rfl: 0   cholecalciferol (VITAMIN D3) 25 MCG (1000 UNIT) tablet, Take 1,000 Units by mouth daily., Disp: , Rfl:    citalopram (CELEXA) 40  MG tablet, Take 1 tablet (40 mg total) by mouth daily., Disp: 90 tablet, Rfl: 1   clonazePAM (KLONOPIN) 0.5 MG tablet, Take 1 tablet (0.5 mg total) by mouth 2 (two) times daily as needed., Disp: 30 tablet, Rfl: 0   glucose blood (ACCU-CHEK GUIDE) test strip, USE TO CHECK BLOOD SUGAR TWICE DAILY AS DIRECTED, Disp: 100 each, Rfl: 5   glucose blood (ACCU-CHEK GUIDE) test strip, USE TO CHECK BLOOD SUGAR DAILY AS DIRECTED  DX CODE:Controlled type 2 diabetes mellitus with microalbuminuria, without long-term current use of insulin (HCC)  E11.29, R80.9, Disp: 100 each, Rfl: 5   levothyroxine (SYNTHROID) 75 MCG tablet, Take 1 tablet (75 mcg total) by mouth daily before breakfast., Disp: 90 tablet, Rfl: 1   losartan (COZAAR) 100 MG tablet, Take 1 tablet (100 mg total) by mouth daily., Disp: 90 tablet, Rfl: 1   metFORMIN (GLUCOPHAGE-XR) 750 MG 24 hr tablet, Take 1 tablet (750 mg total) by mouth 2 (two) times daily., Disp: 180 tablet, Rfl: 1   loratadine (CLARITIN) 10 MG tablet, Take 1 tablet (10 mg total) by mouth daily., Disp: 90 tablet, Rfl: 1  Allergies  Allergen Reactions   Sulfa Antibiotics     I personally reviewed active problem list, medication list, allergies, family history, social history, health maintenance with the patient/caregiver today.   ROS  Constitutional: Negative for fever , positive for  weight change.  Respiratory: Negative for cough and shortness of breath.   Cardiovascular: Negative for chest pain or palpitations.  Gastrointestinal: Negative for abdominal pain, no bowel changes.  Musculoskeletal: Negative for gait problem or joint swelling.  Skin: Negative for rash.  Neurological: Negative for dizziness or headache.  No other specific complaints in a complete review of systems (except as listed in HPI above).  Objective  Vitals:   08/01/20 0901  BP: 138/74  Pulse: 85  Resp: 16  Temp: 98.5 F (36.9 C)  TempSrc: Oral  SpO2: 98%  Weight: 150 lb 14.4 oz (68.4  kg)  Height: _0  (1.499 m)    Body mass index is 30.48 kg/m.  Physical Exam  Constitutional: Patient appears well-developed and well-nourished. Obese  No distress.  HEENT: head atraumatic, normocephalic, pupils equal and reactive to light, neck supple Cardiovascular: Normal rate, regular rhythm and normal heart sounds.  No murmur heard. No BLE edema. Pulmonary/Chest: Effort normal and breath sounds normal. No respiratory distress. Abdominal: Soft.  There is no tenderness. Psychiatric: Patient has a normal mood and affect. behavior is normal. Judgment and thought content normal.  Recent Results (from the past 2160 hour(s))  SARS CORONAVIRUS 2 (TAT 6-24 HRS) Nasopharyngeal Nasopharyngeal Swab     Status: None   Collection Time: 05/05/20 11:37 AM   Specimen: Nasopharyngeal Swab  Result Value Ref Range   SARS Coronavirus 2 NEGATIVE NEGATIVE    Comment: (NOTE) SARS-CoV-2 target nucleic acids are NOT DETECTED.  The SARS-CoV-2  RNA is generally detectable in upper and lower respiratory specimens during the acute phase of infection. Negative results do not preclude SARS-CoV-2 infection, do not rule out co-infections with other pathogens, and should not be used as the sole basis for treatment or other patient management decisions. Negative results must be combined with clinical observations, patient history, and epidemiological information. The expected result is Negative.  Fact Sheet for Patients: SugarRoll.be  Fact Sheet for Healthcare Providers: https://www.woods-mathews.com/  This test is not yet approved or cleared by the [place] FDA and  has been authorized for detection and/or diagnosis of SARS-CoV-2 by FDA under an Emergency Use Authorization (EUA). This EUA will remain  in effect (meaning this test can be used) for the duration of the COVID-19 declaration under Se ction 564(b)(1) of the Act, 21 U.S.C. section 360bbb-3(b)(1),  unless the authorization is terminated or revoked sooner.  Performed at [facility], [place] [address] [place], [place] [address]   Glucose, capillary     Status: Abnormal   Collection Time: 05/07/20 12:50 PM  Result Value Ref Range   Glucose-Capillary 128 (H) 70 - 99 mg/dL    Comment: Glucose reference range applies only to samples taken after fasting for at least 8 hours.  POCT HgB A1C     Status: Abnormal   Collection Time: 08/01/20  9:10 AM  Result Value Ref Range   Hemoglobin A1C 7.4 (A) 4.0 - 5.6 %   HbA1c POC (<> result, manual entry)     HbA1c, POC (prediabetic range)     HbA1c, POC (controlled diabetic range)        PHQ2/9: Depression screen [facility] 2/9 08/01/2020 04/01/2020 01/29/2020 07/30/2019 03/27/2019  Decreased Interest 0 0 0 0 0  Down, Depressed, Hopeless 0 0 1 0 0  PHQ - 2 Score 0 0 1 0 0  Altered sleeping 0 - 0 0 -  Tired, decreased energy 0 - 0 0 -  Change in appetite 0 - 3 0 -  Feeling bad or failure about yourself  0 - 0 0 -  Trouble concentrating 0 - 0 0 -  Moving slowly or fidgety/restless 0 - 1 0 -  Suicidal thoughts 0 - 0 0 -  PHQ-9 Score 0 - 5 0 -  Difficult doing work/chores - - Not difficult at all Not difficult at all -  Some recent data might be hidden    phq 9 is negative   Fall Risk: Fall Risk  08/01/2020 04/01/2020 01/29/2020 07/30/2019 03/27/2019  Falls in the past year? 0 0 0 0 0  Number falls in past yr: 0 0 0 0 0  Injury with Fall? 0 0 0 0 0  Comment - - - - -  Risk for fall due to : - No Fall Risks - - -  Follow up - Falls prevention discussed - - Falls prevention discussed    Functional Status Survey: Is the patient deaf or have difficulty hearing?: No Does the patient have difficulty seeing, even when wearing glasses/contacts?: No Does the patient have difficulty concentrating, remembering, or making decisions?: No Does the patient have difficulty walking or climbing stairs?: No Does the patient have difficulty dressing or  bathing?: No Does the patient have difficulty doing errands alone such as visiting a doctor's office or shopping?: No    Assessment & Plan  1. Controlled type 2 diabetes mellitus with microalbuminuria, without long-term current use of insulin (HCC)  - POCT HgB A1C - metFORMIN (GLUCOPHAGE-XR) 750  MG 24 hr tablet; Take 1 tablet (750 mg total) by mouth 2 (two) times daily.  Dispense: 180 tablet; Refill: 1 - losartan (COZAAR) 100 MG tablet; Take 1 tablet (100 mg total) by mouth daily.  Dispense: 90 tablet; Refill: 1   2. Dyslipidemia  - atorvastatin (LIPITOR) 80 MG tablet; Take 1 tablet (80 mg total) by mouth every evening.  Dispense: 90 tablet; Refill: 1  3. Adult hypothyroidism  - levothyroxine (SYNTHROID) 75 MCG tablet; Take 1 tablet (75 mcg total) by mouth daily before breakfast.  Dispense: 90 tablet; Refill: 1  4. Vitamin D deficiency  Continue vitamin D otc   5. Major depressive disorder with single episode, in full remission ([place])  Continue medications, compliant and doing well   6. Hypertension associated with type 2 diabetes mellitus ([place])  At goal   7. Major depression in remission ([place])  - citalopram (CELEXA) 40 MG tablet; Take 1 tablet (40 mg total) by mouth daily.  Dispense: 90 tablet; Refill: 1  8. Essential hypertension  - losartan (COZAAR) 100 MG tablet; Take 1 tablet (100 mg total) by mouth daily.  Dispense: 90 tablet; Refill: 1  9. Allergy to food   10. Hives  - loratadine (CLARITIN) 10 MG tablet; Take 1 tablet (10 mg total) by mouth daily.  Dispense: 90 tablet; Refill: 1  11. Excoriation of left lower leg, initial encounter  - Tdap vaccine greater than or equal to 7yo IM  12. Need for Tdap vaccination  - Tdap vaccine greater than or equal to 7yo IM

## 2020-08-01 ENCOUNTER — Other Ambulatory Visit: Payer: Self-pay

## 2020-08-01 ENCOUNTER — Encounter: Payer: Self-pay | Admitting: Family Medicine

## 2020-08-01 ENCOUNTER — Ambulatory Visit (INDEPENDENT_AMBULATORY_CARE_PROVIDER_SITE_OTHER): Payer: Medicare Other | Admitting: Family Medicine

## 2020-08-01 VITALS — BP 138/74 | HR 85 | Temp 98.5°F | Resp 16 | Ht 59.0 in | Wt 150.9 lb

## 2020-08-01 DIAGNOSIS — Z23 Encounter for immunization: Secondary | ICD-10-CM | POA: Diagnosis not present

## 2020-08-01 DIAGNOSIS — S80812A Abrasion, left lower leg, initial encounter: Secondary | ICD-10-CM

## 2020-08-01 DIAGNOSIS — E785 Hyperlipidemia, unspecified: Secondary | ICD-10-CM

## 2020-08-01 DIAGNOSIS — E559 Vitamin D deficiency, unspecified: Secondary | ICD-10-CM

## 2020-08-01 DIAGNOSIS — I152 Hypertension secondary to endocrine disorders: Secondary | ICD-10-CM | POA: Insufficient documentation

## 2020-08-01 DIAGNOSIS — F325 Major depressive disorder, single episode, in full remission: Secondary | ICD-10-CM

## 2020-08-01 DIAGNOSIS — Z91018 Allergy to other foods: Secondary | ICD-10-CM | POA: Diagnosis not present

## 2020-08-01 DIAGNOSIS — L509 Urticaria, unspecified: Secondary | ICD-10-CM | POA: Diagnosis not present

## 2020-08-01 DIAGNOSIS — E1159 Type 2 diabetes mellitus with other circulatory complications: Secondary | ICD-10-CM | POA: Diagnosis not present

## 2020-08-01 DIAGNOSIS — R809 Proteinuria, unspecified: Secondary | ICD-10-CM | POA: Diagnosis not present

## 2020-08-01 DIAGNOSIS — E039 Hypothyroidism, unspecified: Secondary | ICD-10-CM

## 2020-08-01 DIAGNOSIS — I10 Essential (primary) hypertension: Secondary | ICD-10-CM | POA: Diagnosis not present

## 2020-08-01 DIAGNOSIS — E1129 Type 2 diabetes mellitus with other diabetic kidney complication: Secondary | ICD-10-CM

## 2020-08-01 LAB — POCT GLYCOSYLATED HEMOGLOBIN (HGB A1C): Hemoglobin A1C: 7.4 % — AB (ref 4.0–5.6)

## 2020-08-01 MED ORDER — ATORVASTATIN CALCIUM 80 MG PO TABS: 80.0000 mg | ORAL_TABLET | Freq: Every evening | ORAL | 1 refills | Status: DC

## 2020-08-01 MED ORDER — LOSARTAN POTASSIUM 100 MG PO TABS: 100.0000 mg | ORAL_TABLET | Freq: Every day | ORAL | 1 refills | Status: DC

## 2020-08-01 MED ORDER — LORATADINE 10 MG PO TABS: 10.0000 mg | ORAL_TABLET | Freq: Every day | ORAL | 1 refills | Status: DC

## 2020-08-01 MED ORDER — CITALOPRAM HYDROBROMIDE 40 MG PO TABS: 40.0000 mg | ORAL_TABLET | Freq: Every day | ORAL | 1 refills | Status: DC

## 2020-08-01 MED ORDER — LEVOTHYROXINE SODIUM 75 MCG PO TABS: 75.0000 ug | ORAL_TABLET | Freq: Every day | ORAL | 1 refills | Status: DC

## 2020-08-01 MED ORDER — METFORMIN HCL ER 750 MG PO TB24: 750.0000 mg | ORAL_TABLET | Freq: Two times a day (BID) | ORAL | 1 refills | Status: DC

## 2020-08-25 ENCOUNTER — Other Ambulatory Visit: Payer: Self-pay | Admitting: Family Medicine

## 2020-08-25 DIAGNOSIS — L509 Urticaria, unspecified: Secondary | ICD-10-CM

## 2020-09-23 ENCOUNTER — Telehealth: Payer: Self-pay

## 2020-09-23 NOTE — Progress Notes (Signed)
  Chronic Care Management Pharmacy Assistant   Name: Megan Short  MRN: [id number] DOB: 01/27/1946  Reason for Encounter:Hypertension Disease State Call.   Recent office visits:  08/01/2020 Dr.Sowles PCP   Recent consult visits:  No recent Consult Visit  Hospital visits:  None in previous 6 months  Medications: Outpatient Encounter Medications as of 09/23/2020  Medication Sig Note  . aspirin 81 MG tablet Take by mouth.   . atorvastatin (LIPITOR) 80 MG tablet Take 1 tablet (80 mg total) by mouth every evening.   . blood glucose meter kit and supplies Dispense based on patient and insurance preference. Check bid daily as directed. (FOR ICD-10 E10.9, E11.9).   . cholecalciferol (VITAMIN D3) 25 MCG (1000 UNIT) tablet Take 1,000 Units by mouth daily.   . citalopram (CELEXA) 40 MG tablet Take 1 tablet (40 mg total) by mouth daily.   . clonazePAM (KLONOPIN) 0.5 MG tablet Take 1 tablet (0.5 mg total) by mouth 2 (two) times daily as needed. 04/01/2020: Rarely  . glucose blood (ACCU-CHEK GUIDE) test strip USE TO CHECK BLOOD SUGAR TWICE DAILY AS DIRECTED   . glucose blood (ACCU-CHEK GUIDE) test strip USE TO CHECK BLOOD SUGAR DAILY AS DIRECTED  DX CODE:Controlled type 2 diabetes mellitus with microalbuminuria, without long-term current use of insulin (HCC)  E11.29, R80.9   . levothyroxine (SYNTHROID) 75 MCG tablet Take 1 tablet (75 mcg total) by mouth daily before breakfast.   . loratadine (CLARITIN) 10 MG tablet Take 1 tablet (10 mg total) by mouth daily.   . losartan (COZAAR) 100 MG tablet Take 1 tablet (100 mg total) by mouth daily.   . metFORMIN (GLUCOPHAGE-XR) 750 MG 24 hr tablet Take 1 tablet (750 mg total) by mouth 2 (two) times daily.    No facility-administered encounter medications on file as of 09/23/2020.   Star Rating Drugs: Atorvastatin 80 mg last filled on 07/20/2020 for 90 day supply at [facility]. Losartan 100 mg last filled on 04/27/2020 for 90 day supp at [facility]. Metformin 750 mg last filled on 07/28/2020   Reviewed chart prior to disease state call. Spoke with patient regarding BP  Recent Office Vitals: BP Readings from Last 3 Encounters:  08/01/20 138/74  05/07/20 119/89  04/01/20 140/80   Pulse Readings from Last 3 Encounters:  08/01/20 85  05/07/20 92  04/01/20 86    Wt Readings from Last 3 Encounters:  08/01/20 150 lb 14.4 oz (68.4 kg)  05/07/20 145 lb (65.8 kg)  04/01/20 157 lb 6.4 oz (71.4 kg)     Kidney Function Lab Results  Component Value Date/Time   CREATININE 0.97 (H) 01/29/2020 10:34 AM   CREATININE 1.09 (H) 03/07/2019 12:00 AM   GFRNONAA 58 (L) 01/29/2020 10:34 AM   GFRAA 67 01/29/2020 10:34 AM    BMP Latest Ref Rng & Units 01/29/2020 03/07/2019 03/21/2018  Glucose 65 - 99 mg/dL 158(H) 120(H) 78  BUN 7 - 25 mg/dL 17 18 20  Creatinine 0.60 - 0.93 mg/dL 0.97(H) 1.09(H) 1.03(H)  BUN/Creat Ratio 6 - 22 (calc) 18 17 19  Sodium 135 - 146 mmol/L 136 141 140  Potassium 3.5 - 5.3 mmol/L 4.7 4.4 4.1  Chloride 98 - 110 mmol/L 104 107 106  CO2 20 - 32 mmol/L 23 28 24  Calcium 8.6 - 10.4 mg/dL 9.7 10.2 10.1    . Current antihypertensive regimen:   Losartan 100 mg daily  . How often are you checking your Blood Pressure? Patient states she is   not checking her blood pressre at home. . Current home BP readings: None ID . What recent interventions/DTPs have been made by any provider to improve Blood Pressure control since last CPP Visit: None ID . Any recent hospitalizations or ED visits since last visit with CPP? No . What diet changes have been made to improve Blood Pressure Control?  o No changes indicated . What exercise is being done to improve your Blood Pressure Control?  o Patient states she exercise 4 -5 times a week at the gym down the road from her house,and she walks depending on the weather.  Adherence Review: Is the patient currently on ACE/ARB medication? Yes Does the patient have >5 day gap between  last estimated fill dates? Yes   Megan Short,CPA Clinical Pharmacist Assistant [phone]   

## 2020-10-22 ENCOUNTER — Other Ambulatory Visit: Payer: Self-pay | Admitting: Family Medicine

## 2020-10-22 DIAGNOSIS — Z1231 Encounter for screening mammogram for malignant neoplasm of breast: Secondary | ICD-10-CM

## 2020-10-24 ENCOUNTER — Telehealth: Payer: Self-pay | Admitting: Family Medicine

## 2020-10-24 NOTE — Telephone Encounter (Signed)
Raynelle Fanning, from pharmacy, calling and is requesting to have an order placed for pts shingles shot. Please advise.   [facility] [place], [place] - [address]  [address] [place] [address]  Phone: [phone] Fax: [phone]  Hours: Not open 24 hours

## 2020-10-25 DIAGNOSIS — Z23 Encounter for immunization: Secondary | ICD-10-CM | POA: Diagnosis not present

## 2020-10-27 ENCOUNTER — Other Ambulatory Visit: Payer: Self-pay

## 2020-10-27 DIAGNOSIS — Z23 Encounter for immunization: Secondary | ICD-10-CM

## 2020-10-27 MED ORDER — SHINGRIX 50 MCG/0.5ML IM SUSR: 0.5000 mL | Freq: Once | INTRAMUSCULAR | 1 refills | Status: DC

## 2020-10-27 MED ORDER — SHINGRIX 50 MCG/0.5ML IM SUSR: 0.5000 mL | Freq: Once | INTRAMUSCULAR | 1 refills | Status: AC

## 2020-10-28 ENCOUNTER — Other Ambulatory Visit: Payer: Self-pay | Admitting: Family Medicine

## 2020-10-28 DIAGNOSIS — Z23 Encounter for immunization: Secondary | ICD-10-CM

## 2020-10-28 MED ORDER — SHINGRIX 50 MCG/0.5ML IM SUSR: 0.5000 mL | Freq: Once | INTRAMUSCULAR | 1 refills | Status: AC

## 2020-10-28 NOTE — Telephone Encounter (Signed)
Patient checking on the status of shingles vaccination best # [phone]

## 2020-10-28 NOTE — Telephone Encounter (Signed)
Patient notified

## 2020-11-04 ENCOUNTER — Telehealth: Payer: Self-pay

## 2020-11-04 NOTE — Telephone Encounter (Signed)
Copied from CRM [id number]. Topic: General - Other >> Oct 30, 2020  6:54 PM Pawlus, Maxine Glenn A wrote: Reason for CRM: Pt stated that [facility] will be faxing a form over tomorrow (10/31/20) for her shingles shot, pt requested a call back when the form has beeen completed. >> Oct 31, 2020  3:21 PM Glean Salen wrote: Patient called in regards to form for shingles shot from [facility]. Pt is requesting cb when received. Please advise

## 2020-11-04 NOTE — Telephone Encounter (Signed)
Spoke with patient and informed her that we did receive the [facility] prior authorization form for the shingrix and once the form is complete we will call to let her know.  Patient verbalized understanding.

## 2020-11-28 ENCOUNTER — Other Ambulatory Visit: Payer: Self-pay | Admitting: Family Medicine

## 2020-12-02 ENCOUNTER — Telehealth: Payer: Self-pay | Admitting: Family Medicine

## 2020-12-02 MED ORDER — ACCU-CHEK GUIDE VI STRP
ORAL_STRIP | 2 refills | Status: DC
Start: 2020-12-02 — End: 2020-12-03

## 2020-12-02 NOTE — Telephone Encounter (Signed)
Requested medication (s) are due for refill today: Yes  Requested medication (s) are on the active medication list: Yes  Last refill:  4 days ago  Future visit scheduled: Yes  Notes to clinic:  Pharmacy requesting diagnosis code, frequency of testing with new Rx     Requested Prescriptions  Pending Prescriptions Disp Refills   glucose blood (ACCU-CHEK GUIDE) test strip 100 each 0    Sig: USE TO CHECK BLOOD SUGAR DAILY AS DIRECTED      Endocrinology: Diabetes - Testing Supplies Passed - 12/02/2020 12:26 PM      Passed - Valid encounter within last 12 months    Recent Outpatient Visits           4 months ago Controlled type 2 diabetes mellitus with microalbuminuria, without long-term current use of insulin [facility])   [facility] [place], Danna Hefty, MD   10 months ago Controlled type 2 diabetes mellitus with microalbuminuria, without long-term current use of insulin [facility])   [facility] [place], Danna Hefty, MD   1 year ago Controlled type 2 diabetes mellitus with microalbuminuria, without long-term current use of insulin [facility])   [facility] Alba Cory, MD   1 year ago Dyslipidemia   [facility] [place], Danna Hefty, MD   2 years ago Controlled type 2 diabetes mellitus with microalbuminuria, without long-term current use of insulin [facility])   [facility] Alba Cory, MD       Future Appointments             In 1 month Carlynn Purl, Danna Hefty, MD [facility]   In 4 months  [facility]

## 2020-12-02 NOTE — Telephone Encounter (Signed)
Pt informed script has been sent to pharmacy 

## 2020-12-02 NOTE — Telephone Encounter (Signed)
Last seen 2.25.2022-no upcoming appts sch'd

## 2020-12-02 NOTE — Telephone Encounter (Signed)
Courtney called from [facility] , states needs new prescription with more info on it for accu-check strips, with dx codes and specific testing frequency. Phone # [phone]

## 2020-12-03 MED ORDER — ACCU-CHEK GUIDE VI STRP
ORAL_STRIP | 2 refills | Status: DC
Start: 2020-12-03 — End: 2020-12-04

## 2020-12-03 NOTE — Telephone Encounter (Signed)
Megan Short, from [facility] pharmacy, called stating that the prescription, but there was still no diagnosis code. She is requesting to have someone resend. Please advise.

## 2020-12-03 NOTE — Addendum Note (Signed)
Addended by: Alba Cory F on: 12/03/2020 01:51 PM   Modules accepted: Orders

## 2020-12-04 ENCOUNTER — Other Ambulatory Visit: Payer: Self-pay | Admitting: Family Medicine

## 2020-12-04 ENCOUNTER — Other Ambulatory Visit: Payer: Self-pay

## 2020-12-04 MED ORDER — ACCU-CHEK GUIDE VI STRP: 1.0000 | ORAL_STRIP | Freq: Every day | 2 refills | Status: DC | PRN

## 2020-12-04 NOTE — Telephone Encounter (Addendum)
Received Rx with dx code but new rx reflects 2x daily and insurance will not cover. Insurance will cover the originally Rx reflecting 1x daily.Pharmacy stated there not aware patient is insulin dependent

## 2020-12-05 MED ORDER — ACCU-CHEK GUIDE VI STRP: 1.0000 | ORAL_STRIP | Freq: Every day | 2 refills | Status: DC | PRN

## 2020-12-15 ENCOUNTER — Ambulatory Visit
Admission: RE | Admit: 2020-12-15 | Discharge: 2020-12-15 | Disposition: A | Discharge status: Home / Self Care | Payer: Medicare Other | Source: Ambulatory Visit | Attending: Family Medicine | Admitting: Family Medicine

## 2020-12-15 ENCOUNTER — Other Ambulatory Visit: Payer: Self-pay

## 2020-12-15 DIAGNOSIS — Z1231 Encounter for screening mammogram for malignant neoplasm of breast: Secondary | ICD-10-CM | POA: Diagnosis not present

## 2020-12-17 ENCOUNTER — Other Ambulatory Visit: Payer: Self-pay | Admitting: Family Medicine

## 2020-12-17 DIAGNOSIS — N6489 Other specified disorders of breast: Secondary | ICD-10-CM

## 2020-12-17 DIAGNOSIS — R928 Other abnormal and inconclusive findings on diagnostic imaging of breast: Secondary | ICD-10-CM

## 2020-12-22 ENCOUNTER — Ambulatory Visit
Admission: RE | Admit: 2020-12-22 | Discharge: 2020-12-22 | Disposition: A | Discharge status: Home / Self Care | Payer: Medicare Other | Source: Ambulatory Visit | Attending: Family Medicine | Admitting: Family Medicine

## 2020-12-22 ENCOUNTER — Other Ambulatory Visit: Payer: Self-pay

## 2020-12-22 DIAGNOSIS — R928 Other abnormal and inconclusive findings on diagnostic imaging of breast: Secondary | ICD-10-CM | POA: Insufficient documentation

## 2020-12-22 DIAGNOSIS — R922 Inconclusive mammogram: Secondary | ICD-10-CM | POA: Diagnosis not present

## 2020-12-22 DIAGNOSIS — N6489 Other specified disorders of breast: Secondary | ICD-10-CM

## 2020-12-22 NOTE — Progress Notes (Signed)
Diagnostic and ultrasounds ordered today

## 2020-12-23 ENCOUNTER — Telehealth: Payer: Self-pay

## 2020-12-23 NOTE — Progress Notes (Signed)
Chronic Care Management Pharmacy Assistant   Name: Megan Short  MRN: [id number] DOB: [birth year]  Reason for Encounter: Diabetes Disease State Call   Recent office visits:  No recent office visits  Recent consult visits:  No recent consult visits  Hospital visits:  None in previous 6 months  Medications: Outpatient Encounter Medications as of 12/23/2020  Medication Sig Note   aspirin 81 MG tablet Take by mouth.    atorvastatin (LIPITOR) 80 MG tablet Take 1 tablet (80 mg total) by mouth every evening.    blood glucose meter kit and supplies Dispense based on patient and insurance preference. Check bid daily as directed. (FOR ICD-10 E10.9, E11.9).    cholecalciferol (VITAMIN D3) 25 MCG (1000 UNIT) tablet Take 1,000 Units by mouth daily.    citalopram (CELEXA) 40 MG tablet Take 1 tablet (40 mg total) by mouth daily.    clonazePAM (KLONOPIN) 0.5 MG tablet Take 1 tablet (0.5 mg total) by mouth 2 (two) times daily as needed. 04/01/2020: Rarely   glucose blood (ACCU-CHEK GUIDE) test strip 1 each by Other route daily as needed for other.    levothyroxine (SYNTHROID) 75 MCG tablet Take 1 tablet (75 mcg total) by mouth daily before breakfast.    loratadine (CLARITIN) 10 MG tablet Take 1 tablet (10 mg total) by mouth daily.    losartan (COZAAR) 100 MG tablet Take 1 tablet (100 mg total) by mouth daily.    metFORMIN (GLUCOPHAGE-XR) 750 MG 24 hr tablet Take 1 tablet (750 mg total) by mouth 2 (two) times daily.    No facility-administered encounter medications on file as of 12/23/2020.   Care Gaps: Zoster Vaccines- Shingrix (there was an order placed in May) COVID-19 Vaccine Dose 4  Star Rating Drugs: Atorvastatin 80 mg last filled on 07/20/2020 for 90 day supply at [facility]. Losartan 100 mg last filled on 04/27/2020 for 90 day supp at [facility]. Metformin 750 mg last filled on 07/28/2020   Recent Relevant Labs: Lab Results  Component Value Date/Time   HGBA1C 7.4 (A)  08/01/2020 09:10 AM   HGBA1C 7.5 (A) 01/29/2020 10:30 AM   HGBA1C 6.5 07/30/2019 09:44 AM   HGBA1C 6.4 (H) 03/07/2019 12:00 AM   HGBA1C 6.2 10/24/2018 09:22 AM   HGBA1C 6.2 11/17/2017 09:10 AM   HGBA1C 6.8 09/04/2015 10:23 AM   MICROALBUR 3.3 01/29/2020 10:34 AM   MICROALBUR 1.4 03/07/2019 12:00 AM   MICROALBUR 50 06/12/2015 10:18 AM    Kidney Function Lab Results  Component Value Date/Time   CREATININE 0.97 (H) 01/29/2020 10:34 AM   CREATININE 1.09 (H) 03/07/2019 12:00 AM   GFRNONAA 58 (L) 01/29/2020 10:34 AM   GFRAA 67 01/29/2020 10:34 AM    Current antihyperglycemic regimen:  Metformin 750 mg twice a day  What recent interventions/DTPs have been made to improve glycemic control:  None ID  Have there been any recent hospitalizations or ED visits since last visit with CPP? No  Patient denies hypoglycemic symptoms, including Pale, Sweaty, Shaky, Hungry, Nervous/irritable, and Vision changes  Patient denies hyperglycemic symptoms, including blurry vision, excessive thirst, fatigue, polyuria, and weakness  How often are you checking your blood sugar? once daily sometimes twice a day.  What are your blood sugars ranging?  Patient frequently checks her blood sugars in the mornings before she eat's. She gave me her last 10 numbers for morning starting on 07/10 which were 142, 122, 171, 161, 154, 149, 156, 133, 149, 144. She reports that she has been  eating a lot of watermelon especially prior to bed time and she has been eating a [employer] cream crunch a few days out of the week. I did ask the patient to check her blood sugars 2 hours after her biggest meal and after she has taken her Metformin to see if her numbers are still a little elevated as she reports these numbers are more elevated than what she is used to. I also educated her on maybe trying to cut back on eating the watermelon prior to bedtime to see if her numbers start improving. She reports that she does exercise almost  daily so I told her if she exercises right after breakfast that may be a good time to have watermelon prior to her exercising. Patient also stated she will be in to see PCP on 08/03 and will have her A1C checked as she inquired about if she needs another medication to assist with the Metformin. She reports that she used to be on Glipizide. I expressed that once her provider get's the results from her A1C if the provider feels that a new medication should be added along with her Metformin to control her BS better that they would let her know. I also told her that [name] would see this note and her numbers as well and speak with her about any changes on 08/11. Patient stated that she is going to limit her watermelon and ice cream intake to see if that lowers her numbers. I also educated her that if she has to have ice cream to look for the ice cream that has low carbs as the grams of sugar in those ice creams will be less than just regular ice cream. Patient verbalized understanding to all.    During the week, how often does your blood glucose drop below 70? Never  Are you checking your feet daily/regularly? Patient denies any issues  Adherence Review: Is the patient currently on a STATIN medication? Yes Is the patient currently on ACE/ARB medication? Yes Does the patient have >5 day gap between last estimated fill dates? Yes  Patient provided my contact information for future communication.   Patient has an upcoming appointment with [name], CPP on 01/15/2021 @ 1300 Patient has her [name] for 04/02/2021 @ [place], CPA/CMA Clinical Pharmacist Assistant Phone: [phone]

## 2021-01-06 NOTE — Progress Notes (Signed)
Name: Megan Short   MRN: [id number]    DOB: November 19, 1945   Date:01/07/2021       Progress Note  Subjective  Chief Complaint  Follow Up  HPI  HTN: taking medication - Losartan  daily  and denies side effects. No chest pain, no palpitation, or orthostatic changes. BP today is towards low end of normal, she will monitor at home and we may decrease dose of losartan from 100-50 mg if remains low    Hypothyroidism: denies fatigue, hair loss, dry skin,  or constipation . She has states only occasionally chokes when eating fast, no dysphagia. She is taking Levothyroxine as prescribed last TSH at goal, we will recheck labs today    DMII:  She is exercising again, eating smaller portions, she is on higher dose of Metformin at 1500 mg  A1C has gone up from 6.5 % to 7.5 % ,7.4 % , today is down to 6.7 % .   She denies polyphagia, polydipsia, or polyuria.. She is taking ARB and last  urine micro has improved down from 100 but last time it was normal .Seen by  Nephrologist - Dr. Candiss Norse - in the pas but has been released. Hyperparathyroidism resolved. 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 She denies anhedonia, she has been going to the gym multiple times a week, denies crying spells,  still has difficulty making decisions ( like picking out an outfit) but not about finances. Her mother died 01/28/20.    Dyslipidemia: on Atorvastatin,denies myalgias, doing well on medication. Last LDL was at goal- 53 . Continue medication  Recheck labs today    Vitamin D deficiency: last labs were at goal, continue supplementation , recheck level   Hives: doing well since started on loratadine  Patient Active Problem List   Diagnosis Date Noted   Hypertension associated with type 2 diabetes mellitus ([place])  04/28/6243   Umbilical hernia without obstruction and without gangrene 09/13/2016   Diabetes mellitus with renal manifestations, controlled ([place]) 09/07/2014   Adult hypothyroidism 09/07/2014   Vitamin D deficiency 09/07/2014   Benign essential HTN 09/07/2014   Microalbuminuria 09/07/2014   Dyslipidemia 09/07/2014   Major depression in full remission ([place]) 09/07/2014   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/07/2014   Calculus of kidney 09/07/2014   Memory loss or impairment 09/07/2014    Past Surgical History:  Procedure Laterality Date   BREAST BIOPSY Right    negative times 2   COLONOSCOPY WITH PROPOFOL N/A 01/20/2015   Procedure: COLONOSCOPY WITH PROPOFOL;  Surgeon: Manya Silvas, MD;  Location: [facility];  Service: Endoscopy;  Laterality: N/A;   COLONOSCOPY WITH PROPOFOL N/A 05/07/2020   Procedure: COLONOSCOPY WITH PROPOFOL;  Surgeon: Toledo, Benay Pike, MD;  Location: [facility] ENDOSCOPY;  Service: Gastroenterology;  Laterality: N/A;   LITHOTRIPSY Left 12/2013    Family History  Problem Relation Age of Onset   Osteoarthritis Mother    Kidney failure Mother    Failure to thrive Mother    Osteoarthritis Sister    Alzheimer's disease Maternal Aunt    Breast cancer Neg Hx     Social History   Tobacco Use   Smoking status: Never  Smokeless tobacco: Never  Substance Use Topics   Alcohol use: No    Alcohol/week: 0.0 standard drinks     Current Outpatient Medications:    aspirin 81 MG tablet, Take by mouth., Disp: , Rfl:    blood glucose meter kit and supplies, Dispense based on patient and insurance preference. Check bid daily as directed. (FOR ICD-10 E10.9, E11.9)., Disp: 1 each, Rfl: 0   cholecalciferol (VITAMIN D3) 25 MCG (1000 UNIT) tablet, Take 1,000 Units by mouth daily., Disp: , Rfl:    levothyroxine (SYNTHROID) 75 MCG tablet, Take 1 tablet (75 mcg total) by mouth daily before breakfast., Disp: 90 tablet, Rfl: 1   atorvastatin (LIPITOR) 80 MG  tablet, Take 1 tablet (80 mg total) by mouth every evening., Disp: 90 tablet, Rfl: 1   citalopram (CELEXA) 40 MG tablet, Take 1 tablet (40 mg total) by mouth daily., Disp: 90 tablet, Rfl: 1   clonazePAM (KLONOPIN) 0.5 MG tablet, Take 1 tablet (0.5 mg total) by mouth 2 (two) times daily as needed., Disp: 30 tablet, Rfl: 0   glucose blood (ACCU-CHEK GUIDE) test strip, 1 each by Other route 2 (two) times daily as needed for other., Disp: 100 each, Rfl: 2   loratadine (CLARITIN) 10 MG tablet, Take 1 tablet (10 mg total) by mouth daily., Disp: 90 tablet, Rfl: 1   losartan (COZAAR) 100 MG tablet, Take 1 tablet (100 mg total) by mouth daily., Disp: 90 tablet, Rfl: 1   metFORMIN (GLUCOPHAGE-XR) 750 MG 24 hr tablet, Take 1 tablet (750 mg total) by mouth 2 (two) times daily., Disp: 180 tablet, Rfl: 1  Allergies  Allergen Reactions   Sulfa Antibiotics     I personally reviewed active problem list, medication list, allergies, family history, social history, health maintenance with the patient/caregiver today.   ROS  Constitutional: Negative for fever or weight change.  Respiratory: Negative for cough and shortness of breath.   Cardiovascular: Negative for chest pain or palpitations.  Gastrointestinal: Negative for abdominal pain, no bowel changes.  Musculoskeletal: Negative for gait problem or joint swelling.  Skin: Negative for rash.  Neurological: Negative for dizziness or headache.  No other specific complaints in a complete review of systems (except as listed in HPI above).    Objective  Vitals:   01/07/21 1039 01/07/21 1055  BP: 112/70 118/70  Pulse: 97   Resp: 16   Temp: 98.2 F (36.8 C)   SpO2: 99%   Weight: 148 lb (67.1 kg)   Height: _0  (1.499 m)     Body mass index is 29.89 kg/m.  Physical Exam  Constitutional: Patient appears well-developed and well-nourished. Obese  No distress.  HEENT: head atraumatic, normocephalic, pupils equal and reactive to light, neck  supple Cardiovascular: Normal rate, regular rhythm and normal heart sounds.  No murmur heard. No BLE edema. Pulmonary/Chest: Effort normal and breath sounds normal. No respiratory distress. Abdominal: Soft.  There is no tenderness. Psychiatric: Patient has a normal mood and affect. behavior is normal. Judgment and thought content normal.   Diabetic Foot Exam: Diabetic Foot Exam - Simple   Simple Foot Form Diabetic Foot exam was performed with the following findings: Yes 01/07/2021 11:06 AM  Visual Inspection See comments: Yes Sensation Testing Intact to touch and monofilament testing bilaterally: Yes Pulse Check Posterior Tibialis and Dorsalis pulse intact bilaterally: Yes Comments Thick toenails.       PHQ2/9: Depression screen [facility] 2/9 01/07/2021 08/01/2020 04/01/2020 01/29/2020 07/30/2019  Decreased Interest 0 0 0 0 0  Down, Depressed, Hopeless 0 0 0 1 0  PHQ - 2 Score 0 0 0 1 0  Altered sleeping 0 0 - 0 0  Tired, decreased energy 0 0 - 0 0  Change in appetite 0 0 - 3 0  Feeling bad or failure about yourself  0 0 - 0 0  Trouble concentrating 0 0 - 0 0  Moving slowly or fidgety/restless 0 0 - 1 0  Suicidal thoughts 0 0 - 0 0  PHQ-9 Score 0 0 - 5 0  Difficult doing work/chores - - - Not difficult at all Not difficult at all  Some recent data might be hidden    phq 9 is negative   Fall Risk: Fall Risk  01/07/2021 08/01/2020 04/01/2020 01/29/2020 07/30/2019  Falls in the past year? 0 0 0 0 0  Number falls in past yr: 0 0 0 0 0  Injury with Fall? 0 0 0 0 0  Comment - - - - -  Risk for fall due to : - - No Fall Risks - -  Follow up - - Falls prevention discussed - -     Functional Status Survey: Is the patient deaf or have difficulty hearing?: No Does the patient have difficulty seeing, even when wearing glasses/contacts?: No Does the patient have difficulty concentrating, remembering, or making decisions?: No Does the patient have difficulty walking or climbing stairs?:  No Does the patient have difficulty dressing or bathing?: No Does the patient have difficulty doing errands alone such as visiting a doctor's office or shopping?: No    Assessment & Plan  1. Controlled type 2 diabetes mellitus with microalbuminuria, without long-term current use of insulin (HCC) = - POCT HgB A1C - metFORMIN (GLUCOPHAGE-XR) 750 MG 24 hr tablet; Take 1 tablet (750 mg total) by mouth 2 (two) times daily.  Dispense: 180 tablet; Refill: 1 - losartan (COZAAR) 100 MG tablet; Take 1 tablet (100 mg total) by mouth daily.  Dispense: 90 tablet; Refill: 1  2. Hypertension associated with type 2 diabetes mellitus (HCC) = - COMPLETE METABOLIC PANEL WITH GFR - CBC with Differential/Platelet  3. Adult hypothyroidism = - TSH  4. Vitamin D deficiency = - VITAMIN D 25 Hydroxy (Vit-D Deficiency, Fractures)  5. Major depression in remission ([place])  - citalopram (CELEXA) 40 MG tablet; Take 1 tablet (40 mg total) by mouth daily.  Dispense: 90 tablet; Refill: 1 - clonazePAM (KLONOPIN) 0.5 MG tablet; Take 1 tablet (0.5 mg total) by mouth 2 (two) times daily as needed.  Dispense: 30 tablet; Refill: 0  6. Essential hypertension  - losartan (COZAAR) 100 MG tablet; Take 1 tablet (100 mg total) by mouth daily.  Dispense: 90 tablet; Refill: 1  7. Stage 3a chronic kidney disease (HCC)  - COMPLETE METABOLIC PANEL WITH GFR - CBC with Differential/Platelet  8. Microalbuminuria  - Microalbumin / creatinine urine ratio  9. Benign hypertension with chronic kidney disease, stage III ([place])   10. Dyslipidemia  - Lipid panel - atorvastatin (LIPITOR) 80 MG tablet; Take 1 tablet (80 mg total) by mouth every evening.  Dispense: 90 tablet; Refill: 1  11. Hives  - loratadine (CLARITIN) 10 MG tablet; Take 1 tablet (10 mg total) by mouth daily.  Dispense: 90 tablet; Refill: 1

## 2021-01-07 ENCOUNTER — Other Ambulatory Visit: Payer: Self-pay

## 2021-01-07 ENCOUNTER — Ambulatory Visit (INDEPENDENT_AMBULATORY_CARE_PROVIDER_SITE_OTHER): Payer: Medicare Other | Admitting: Family Medicine

## 2021-01-07 ENCOUNTER — Encounter: Payer: Self-pay | Admitting: Family Medicine

## 2021-01-07 VITALS — BP 118/70 | HR 97 | Temp 98.2°F | Resp 16 | Ht 59.0 in | Wt 148.0 lb

## 2021-01-07 DIAGNOSIS — E039 Hypothyroidism, unspecified: Secondary | ICD-10-CM | POA: Diagnosis not present

## 2021-01-07 DIAGNOSIS — F325 Major depressive disorder, single episode, in full remission: Secondary | ICD-10-CM | POA: Diagnosis not present

## 2021-01-07 DIAGNOSIS — E1159 Type 2 diabetes mellitus with other circulatory complications: Secondary | ICD-10-CM | POA: Diagnosis not present

## 2021-01-07 DIAGNOSIS — I129 Hypertensive chronic kidney disease with stage 1 through stage 4 chronic kidney disease, or unspecified chronic kidney disease: Secondary | ICD-10-CM

## 2021-01-07 DIAGNOSIS — I152 Hypertension secondary to endocrine disorders: Secondary | ICD-10-CM

## 2021-01-07 DIAGNOSIS — E559 Vitamin D deficiency, unspecified: Secondary | ICD-10-CM

## 2021-01-07 DIAGNOSIS — E785 Hyperlipidemia, unspecified: Secondary | ICD-10-CM

## 2021-01-07 DIAGNOSIS — R809 Proteinuria, unspecified: Secondary | ICD-10-CM | POA: Diagnosis not present

## 2021-01-07 DIAGNOSIS — N1831 Chronic kidney disease, stage 3a: Secondary | ICD-10-CM

## 2021-01-07 DIAGNOSIS — N183 Chronic kidney disease, stage 3 unspecified: Secondary | ICD-10-CM

## 2021-01-07 DIAGNOSIS — E1129 Type 2 diabetes mellitus with other diabetic kidney complication: Secondary | ICD-10-CM | POA: Diagnosis not present

## 2021-01-07 DIAGNOSIS — I10 Essential (primary) hypertension: Secondary | ICD-10-CM

## 2021-01-07 DIAGNOSIS — L509 Urticaria, unspecified: Secondary | ICD-10-CM

## 2021-01-07 LAB — POCT GLYCOSYLATED HEMOGLOBIN (HGB A1C): Hemoglobin A1C: 6.7 % — AB (ref 4.0–5.6)

## 2021-01-07 MED ORDER — CITALOPRAM HYDROBROMIDE 40 MG PO TABS: 40.0000 mg | ORAL_TABLET | Freq: Every day | ORAL | 1 refills | Status: DC

## 2021-01-07 MED ORDER — ACCU-CHEK GUIDE VI STRP: 1.0000 | ORAL_STRIP | Freq: Two times a day (BID) | 2 refills | Status: DC | PRN

## 2021-01-07 MED ORDER — LOSARTAN POTASSIUM 100 MG PO TABS: 100.0000 mg | ORAL_TABLET | Freq: Every day | ORAL | 1 refills | Status: DC

## 2021-01-07 MED ORDER — LORATADINE 10 MG PO TABS: 10.0000 mg | ORAL_TABLET | Freq: Every day | ORAL | 1 refills | Status: DC

## 2021-01-07 MED ORDER — CLONAZEPAM 0.5 MG PO TABS: 0.5000 mg | ORAL_TABLET | Freq: Two times a day (BID) | ORAL | 0 refills | Status: DC | PRN

## 2021-01-07 MED ORDER — METFORMIN HCL ER 750 MG PO TB24: 750.0000 mg | ORAL_TABLET | Freq: Two times a day (BID) | ORAL | 1 refills | Status: DC

## 2021-01-07 MED ORDER — ATORVASTATIN CALCIUM 80 MG PO TABS: 80.0000 mg | ORAL_TABLET | Freq: Every evening | ORAL | 1 refills | Status: DC

## 2021-01-08 LAB — LIPID PANEL
Cholesterol: 127 mg/dL (ref <200)
HDL: 34 mg/dL — ABNORMAL LOW (ref >=50)
LDL Cholesterol (Calc): 76 mg/dL (calc)
Non-HDL Cholesterol (Calc): 93 mg/dL (calc) (ref <130)
Total CHOL/HDL Ratio: 3.7 (calc) (ref <5.0)
Triglycerides: 89 mg/dL (ref <150)

## 2021-01-08 LAB — COMPLETE METABOLIC PANEL WITH GFR
AG Ratio: 1.4 (calc) (ref 1.0–2.5)
ALT: 25 U/L (ref 6–29)
AST: 51 U/L — ABNORMAL HIGH (ref 10–35)
Albumin: 4.3 g/dL (ref 3.6–5.1)
Alkaline phosphatase (APISO): 91 U/L (ref 37–153)
BUN/Creatinine Ratio: 17 (calc) (ref 6–22)
BUN: 18 mg/dL (ref 7–25)
CO2: 24 mmol/L (ref 20–32)
Calcium: 10.2 mg/dL (ref 8.6–10.4)
Chloride: 104 mmol/L (ref 98–110)
Creat: 1.04 mg/dL — ABNORMAL HIGH (ref 0.60–1.00)
Globulin: 3.1 g/dL (calc) (ref 1.9–3.7)
Glucose, Bld: 126 mg/dL — ABNORMAL HIGH (ref 65–99)
Potassium: 4.3 mmol/L (ref 3.5–5.3)
Sodium: 139 mmol/L (ref 135–146)
Total Bilirubin: 0.7 mg/dL (ref 0.2–1.2)
Total Protein: 7.4 g/dL (ref 6.1–8.1)
eGFR: 57 mL/min/{1.73_m2} — ABNORMAL LOW (ref >=60)

## 2021-01-08 LAB — CBC WITH DIFFERENTIAL/PLATELET
Absolute Monocytes: 462 cells/uL (ref 200–950)
Basophils Absolute: 20 cells/uL (ref 0–200)
Basophils Relative: 0.3 %
Eosinophils Absolute: 124 cells/uL (ref 15–500)
Eosinophils Relative: 1.9 %
HCT: 41.3 % (ref 35.0–45.0)
Hemoglobin: 13.1 g/dL (ref 11.7–15.5)
Lymphs Abs: 2334 cells/uL (ref 850–3900)
MCH: 30 pg (ref 27.0–33.0)
MCHC: 31.7 g/dL — ABNORMAL LOW (ref 32.0–36.0)
MCV: 94.7 fL (ref 80.0–100.0)
MPV: 9.9 fL (ref 7.5–12.5)
Monocytes Relative: 7.1 %
Neutro Abs: 3562 cells/uL (ref 1500–7800)
Neutrophils Relative %: 54.8 %
Platelets: 329 10*3/uL (ref 140–400)
RBC: 4.36 10*6/uL (ref 3.80–5.10)
RDW: 13.3 % (ref 11.0–15.0)
Total Lymphocyte: 35.9 %
WBC: 6.5 10*3/uL (ref 3.8–10.8)

## 2021-01-08 LAB — TSH: TSH: 1.24 mIU/L (ref 0.40–4.50)

## 2021-01-08 LAB — VITAMIN D 25 HYDROXY (VIT D DEFICIENCY, FRACTURES): Vit D, 25-Hydroxy: 59 ng/mL (ref 30–100)

## 2021-01-08 LAB — MICROALBUMIN / CREATININE URINE RATIO
Creatinine, Urine: 120 mg/dL (ref 20–275)
Microalb Creat Ratio: 40 mcg/mg creat — ABNORMAL HIGH (ref <30)
Microalb, Ur: 4.8 mg/dL

## 2021-01-09 ENCOUNTER — Telehealth: Payer: Self-pay

## 2021-01-09 NOTE — Telephone Encounter (Signed)
Copied from CRM [id number]. Topic: General - Other >> Jan 09, 2021 11:31 AM Jaquita Rector A wrote: Reason for CRM: Alcario Drought with [employer] called in to inquire about a form that was faxed over on 01/05/21 need to have this form completed and faxed back please. Any questions please call  Ph# [phone]

## 2021-01-15 ENCOUNTER — Telehealth: Payer: Self-pay

## 2021-01-16 ENCOUNTER — Telehealth: Payer: Self-pay

## 2021-01-16 NOTE — Progress Notes (Signed)
Chronic Care Management Pharmacy Assistant   Name: Megan Short  MRN: [id number] DOB: 1945/12/18  Reason for Encounter: Hypertension Disease State Call/Reschedule appointment with Megan Short, CPP  Recent office visits:  01/07/2021 Megan Sizer, MD (PCP Office Visit) for Follow-up- No Medication Changes indicated; labs ordered; patient instructed to follow-up in 6 months.  Recent consult visits:  No recent consult visits  Hospital visits:  None in previous 6 months  Medications: Outpatient Encounter Medications as of 01/16/2021  Medication Sig   aspirin 81 MG tablet Take by mouth.   atorvastatin (LIPITOR) 80 MG tablet Take 1 tablet (80 mg total) by mouth every evening.   blood glucose meter kit and supplies Dispense based on patient and insurance preference. Check bid daily as directed. (FOR ICD-10 E10.9, E11.9).   cholecalciferol (VITAMIN D3) 25 MCG (1000 UNIT) tablet Take 1,000 Units by mouth daily.   citalopram (CELEXA) 40 MG tablet Take 1 tablet (40 mg total) by mouth daily.   clonazePAM (KLONOPIN) 0.5 MG tablet Take 1 tablet (0.5 mg total) by mouth 2 (two) times daily as needed.   glucose blood (Megan Short) test strip 1 each by Other route 2 (two) times daily as needed for other.   levothyroxine (SYNTHROID) 75 MCG tablet Take 1 tablet (75 mcg total) by mouth daily before breakfast.   loratadine (CLARITIN) 10 MG tablet Take 1 tablet (10 mg total) by mouth daily.   Short (COZAAR) 100 MG tablet Take 1 tablet (100 mg total) by mouth daily.   Short (GLUCOPHAGE-XR) 750 MG 24 hr tablet Take 1 tablet (750 mg total) by mouth 2 (two) times daily.   No facility-administered encounter medications on file as of 01/16/2021.   Care Gaps: Zoster Vaccines- Shingrix COVID-19 Vaccine Booster 4 INFLUENZA VACCINE (last completed 04/01/2020)   Star Rating Drugs: Atorvastatin 80 mg last filled on 07/20/2020 for a 90-Day supply with Megan Short 750 mg last filled  on 04/27/2020 for a 90-Day supply with Megan Short 100 mg last filled on 04/27/2020 for a 90-Day supply with Megan Short  Reviewed chart prior to disease state call. Spoke with patient regarding BP  Recent Office Vitals: BP Readings from Last 3 Encounters:  01/07/21 118/70  08/01/20 138/74  05/07/20 119/89   Pulse Readings from Last 3 Encounters:  01/07/21 97  08/01/20 85  05/07/20 92    Wt Readings from Last 3 Encounters:  01/07/21 148 lb (67.1 kg)  08/01/20 150 lb 14.4 oz (68.4 kg)  05/07/20 145 lb (65.8 kg)     Kidney Function Lab Results  Component Value Date/Time   CREATININE 1.04 (H) 01/07/2021 11:14 AM   CREATININE 0.97 (H) 01/29/2020 10:34 AM   GFRNONAA 58 (L) 01/29/2020 10:34 AM   GFRAA 67 01/29/2020 10:34 AM    BMP Latest Ref Rng & Units 01/07/2021 01/29/2020 03/07/2019  Glucose 65 - 99 mg/dL 126(H) 158(H) 120(H)  BUN 7 - 25 mg/dL _0 Creatinine 0.60 - 1.00 mg/dL 1.04(H) 0.97(H) 1.09(H)  BUN/Creat Ratio 6 - 22 (calc) _1 Sodium 135 - 146 mmol/L 139 136 141  Potassium 3.5 - 5.3 mmol/L 4.3 4.7 4.4  Chloride 98 - 110 mmol/L 104 104 107  CO2 20 - 32 mmol/L _2 Calcium 8.6 - 10.4 mg/dL 10.2 9.7 10.2    Current antihypertensive regimen:  Short 100 mg 1 tablet daily   How often are you checking your Blood Pressure?  Patient reports that she doesn't  check her blood pressure at all at home. I did encourage her to start checking it at least twice a week and keep her numbers so she can provided them to Megan Short on her next visit.  Current home BP readings: Patient does not have a current home reading her last reading was at her PCP appointment and it was 118/70. She stated that Dr. Ancil Short is considering decreasing he Short if it stays normal form 100 mg daily to 50 mg daily.   What recent interventions/DTPs have been made by any provider to improve Blood Pressure control since last CPP Visit: None ID  Any recent hospitalizations or  ED visits since last visit with CPP? No  What diet changes have been made to improve Blood Pressure Control?  Patient states she does not eat  a lot of salt as she doesn't really like salt, and she does try to watch her sodium intake. Patient also reports that she is not a big meat eater she eats more fruits a vegetables.  What exercise is being done to improve your Blood Pressure Control?  Patient reports she normally goes to the gym twice daily.   Adherence Review: Is the patient currently on ACE/ARB medication? Yes Does the patient have >5 day gap between last estimated fill dates? Yes  Patient has been rescheduled with CPP on 02/24/2021 @ 1400 via telephone. [name] for 04/02/2021   Megan Short, CPA/CMA Clinical Pharmacist Assistant Phone: [phone]

## 2021-01-30 ENCOUNTER — Ambulatory Visit: Payer: Medicare Other | Admitting: Family Medicine

## 2021-02-02 ENCOUNTER — Other Ambulatory Visit: Payer: Self-pay | Admitting: Family Medicine

## 2021-02-02 DIAGNOSIS — R809 Proteinuria, unspecified: Secondary | ICD-10-CM

## 2021-02-02 DIAGNOSIS — E039 Hypothyroidism, unspecified: Secondary | ICD-10-CM

## 2021-02-02 DIAGNOSIS — I10 Essential (primary) hypertension: Secondary | ICD-10-CM

## 2021-02-02 DIAGNOSIS — E1129 Type 2 diabetes mellitus with other diabetic kidney complication: Secondary | ICD-10-CM

## 2021-02-05 ENCOUNTER — Encounter: Payer: Self-pay | Admitting: Family Medicine

## 2021-02-07 ENCOUNTER — Other Ambulatory Visit: Payer: Self-pay | Admitting: Family Medicine

## 2021-02-07 DIAGNOSIS — I10 Essential (primary) hypertension: Secondary | ICD-10-CM

## 2021-02-07 DIAGNOSIS — E1129 Type 2 diabetes mellitus with other diabetic kidney complication: Secondary | ICD-10-CM

## 2021-02-07 NOTE — Telephone Encounter (Signed)
Called [facility] and pharmacist stated that the prescriptions are there and will fill both today.

## 2021-02-23 ENCOUNTER — Telehealth: Payer: Self-pay

## 2021-02-23 NOTE — Progress Notes (Signed)
    Chronic Care Management Pharmacy Assistant   Name: Megan Short  MRN: [id number] DOB: 1945-07-25  Called to remind patient about her appointment with Angelena Sole, CPP on 02/24/2021, and realized it was scheduled for the wrong clinic.  I reached out to the patient to see if I could get her rescheduled with Alex on 03/04/2021 @ 1100 via telephone. Patient reports that she has no issues today that she is doing well. Patient advised if she needs something before she speaks with Trinna Post that she can give me a call directly.  Adelene Idler, CPA/CMA Clinical Pharmacist Assistant Phone: [phone]

## 2021-02-24 ENCOUNTER — Telehealth: Payer: 59

## 2021-02-26 DIAGNOSIS — Z23 Encounter for immunization: Secondary | ICD-10-CM | POA: Diagnosis not present

## 2021-03-04 ENCOUNTER — Ambulatory Visit (INDEPENDENT_AMBULATORY_CARE_PROVIDER_SITE_OTHER): Payer: Medicare Other

## 2021-03-04 DIAGNOSIS — E1129 Type 2 diabetes mellitus with other diabetic kidney complication: Secondary | ICD-10-CM

## 2021-03-04 DIAGNOSIS — I152 Hypertension secondary to endocrine disorders: Secondary | ICD-10-CM

## 2021-03-04 DIAGNOSIS — E1159 Type 2 diabetes mellitus with other circulatory complications: Secondary | ICD-10-CM

## 2021-03-04 DIAGNOSIS — R809 Proteinuria, unspecified: Secondary | ICD-10-CM

## 2021-03-04 NOTE — Progress Notes (Signed)
Chronic Care Management Pharmacy Note  03/04/2021 Name:  Megan Short MRN:  [id number] DOB:  [birth year]  Summary: Patient presents for CCM follow-up. She is doing well today overall. She continues to worry about a bunion/wart that has not gone away. Her blood sugars are slightly above goal. She is interested in switching insurances so she can get the Shingrix shot, and may be interested in upstream if affordable under new insurance.   Recommendations/Changes made from today's visit: Continue current medications  Plan: CPP follow-up 6 months   Subjective: Megan Short is an 75 y.o. year old female who is a primary patient of [name], MD.  The CCM team was consulted for assistance with disease management and care coordination needs.    Engaged with patient by telephone for follow up visit in response to provider referral for pharmacy case management and/or care coordination services.   Consent to Services:  The patient was given information about Chronic Care Management services, agreed to services, and gave verbal consent prior to initiation of services.  Please see initial visit note for detailed documentation.   Patient Care Team: [name], MD as PCP - General (Family Medicine) Gardiner Barefoot, DPM as Consulting Physician (Podiatry) Baxter Kail, MD as Consulting Physician (Dermatology) Germaine Pomfret, [facility] as Pharmacist (Pharmacist)  Recent office visits: 01/07/21: Patient presented to Dr. Ancil Boozer for follow-up. A1c improved to 6.7%. Microalbuminuria  04/01/20: Patient presented to Clemetine Marker, LPN for AWV.  1/96/22: Patient presented to Dr. Ancil Boozer for follow-up. Metformin increased to 750 mg twice daily.   Recent consult visits: None in previous 6 months  Hospital visits: None in previous 6 months  Objective:  Lab Results  Component Value Date   CREATININE 1.04 (H) 01/07/2021   BUN 18 01/07/2021   GFRNONAA 58 (L) 01/29/2020   GFRAA 67 01/29/2020   NA  139 01/07/2021   K 4.3 01/07/2021   CALCIUM 10.2 01/07/2021   CO2 24 01/07/2021    Lab Results  Component Value Date/Time   HGBA1C 6.7 (A) 01/07/2021 10:47 AM   HGBA1C 7.4 (A) 08/01/2020 09:10 AM   HGBA1C 6.5 07/30/2019 09:44 AM   HGBA1C 6.4 (H) 03/07/2019 12:00 AM   HGBA1C 6.2 10/24/2018 09:22 AM   HGBA1C 6.2 11/17/2017 09:10 AM   HGBA1C 6.8 09/04/2015 10:23 AM   MICROALBUR 4.8 01/07/2021 11:14 AM   MICROALBUR 3.3 01/29/2020 10:34 AM   MICROALBUR 50 06/12/2015 10:18 AM    Last diabetic Eye exam:  Lab Results  Component Value Date/Time   HMDIABEYEEXA No Retinopathy 07/28/2020 12:00 AM    Last diabetic Foot exam: No results found for: HMDIABFOOTEX   Lab Results  Component Value Date   CHOL 127 01/07/2021   HDL 34 (L) 01/07/2021   LDLCALC 76 01/07/2021   TRIG 89 01/07/2021   CHOLHDL 3.7 01/07/2021    Hepatic Function Latest Ref Rng & Units 01/07/2021 01/29/2020 03/07/2019  Total Protein 6.1 - 8.1 g/dL 7.4 7.1 7.2  Albumin 3.6 - 5.1 g/dL - - -  AST 10 - 35 U/L 51(H) 52(H) 43(H)  ALT 6 - 29 U/L 25 30(H) 19  Alk Phosphatase 33 - 130 U/L - - -  Total Bilirubin 0.2 - 1.2 mg/dL 0.7 0.3 0.6    Lab Results  Component Value Date/Time   TSH 1.24 01/07/2021 11:14 AM   TSH 1.72 01/29/2020 10:34 AM    CBC Latest Ref Rng & Units 01/07/2021 01/29/2020 03/07/2019  WBC 3.8 - 10.8 Thousand/uL 6.5 6.6 5.9  Hemoglobin 11.7 - 15.5 g/dL 13.1 13.1 12.6  Hematocrit 35.0 - 45.0 % 41.3 40.1 39.4  Platelets 140 - 400 Thousand/uL 329 286 334    Lab Results  Component Value Date/Time   VD25OH 59 01/07/2021 11:14 AM   VD25OH 36 03/07/2019 12:00 AM    Clinical ASCVD: No  The ASCVD Risk score (Arnett DK, et al., 2019) failed to calculate for the following reasons:   The valid total cholesterol range is 130 to 320 mg/dL    Depression screen [facility] 2/9 01/07/2021 08/01/2020 04/01/2020  Decreased Interest 0 0 0  Down, Depressed, Hopeless 0 0 0  PHQ - 2 Score 0 0 0  Altered sleeping 0 0 -  Tired,  decreased energy 0 0 -  Change in appetite 0 0 -  Feeling bad or failure about yourself  0 0 -  Trouble concentrating 0 0 -  Moving slowly or fidgety/restless 0 0 -  Suicidal thoughts 0 0 -  PHQ-9 Score 0 0 -  Difficult doing work/chores - - -  Some recent data might be hidden     Social History   Tobacco Use  Smoking Status Never  Smokeless Tobacco Never   BP Readings from Last 3 Encounters:  01/07/21 118/70  08/01/20 138/74  05/07/20 119/89   Pulse Readings from Last 3 Encounters:  01/07/21 97  08/01/20 85  05/07/20 92   Wt Readings from Last 3 Encounters:  01/07/21 148 lb (67.1 kg)  08/01/20 150 lb 14.4 oz (68.4 kg)  05/07/20 145 lb (65.8 kg)    Assessment/Interventions: Review of patient past medical history, allergies, medications, health status, including review of consultants reports, laboratory and other test data, was performed as part of comprehensive evaluation and provision of chronic care management services.   SDOH:  (Social Determinants of Health) assessments and interventions performed: Yes SDOH Interventions    Flowsheet Row Most Recent Value  SDOH Interventions   Financial Strain Interventions Intervention Not Indicated        CCM Care Plan  Allergies  Allergen Reactions   Sulfa Antibiotics     Medications Reviewed Today     Reviewed by [name], MD (Physician) on 01/07/21 at 1106  Med List Status: <None>   Medication Order Taking? Sig Documenting Provider Last Dose Status Informant  aspirin 81 MG tablet [id number] Yes Take by mouth. [provider] Taking Active            Med Note Clemetine Marker D   Tue Apr 01, 2020 10:04 AM)    atorvastatin (LIPITOR) 80 MG tablet [id number]  Take 1 tablet (80 mg total) by mouth every evening. [name], MD  Active   blood glucose meter kit and supplies [id number] Yes Dispense based on patient and insurance preference. Check bid daily as directed. (FOR ICD-10 E10.9, E11.9). [name], MD Taking Active   cholecalciferol (VITAMIN D3) 25 MCG (1000 UNIT) tablet [id number] Yes Take 1,000 Units by mouth daily. [provider] Taking Active   citalopram (CELEXA) 40 MG tablet [id number]  Take 1 tablet (40 mg total) by mouth daily. [name], MD  Active   clonazePAM Bobbye [place]) 0.5 MG tablet [id number]  Take 1 tablet (0.5 mg total) by mouth 2 (two) times daily as needed. [name], MD  Active   glucose blood (ACCU-CHEK GUIDE) test strip [id number]  1 each by Other route 2 (two) times daily as needed for other. [name], MD  Active   levothyroxine (SYNTHROID) 75 MCG  tablet [id number] Yes Take 1 tablet (75 mcg total) by mouth daily before breakfast. [name], MD Taking Active   loratadine (CLARITIN) 10 MG tablet [id number]  Take 1 tablet (10 mg total) by mouth daily. [name], MD  Active   losartan (COZAAR) 100 MG tablet [id number]  Take 1 tablet (100 mg total) by mouth daily. [name], MD  Active   metFORMIN (GLUCOPHAGE-XR) 750 MG 24 hr tablet [id number]  Take 1 tablet (750 mg total) by mouth 2 (two) times daily. [name], MD  Active             Patient Active Problem List   Diagnosis Date Noted   Hypertension associated with type 2 diabetes mellitus ([place]) 46/80/3212   Umbilical hernia without obstruction and without gangrene 09/13/2016   Diabetes mellitus with renal manifestations, controlled ([place]) 09/07/2014   Adult hypothyroidism 09/07/2014   Vitamin D deficiency 09/07/2014   Benign essential HTN 09/07/2014   Microalbuminuria 09/07/2014   Dyslipidemia 09/07/2014   Major depression in full remission ([place]) 09/07/2014   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/07/2014   Calculus of kidney 09/07/2014   Memory loss or impairment 09/07/2014    Immunization History  Administered Date(s) Administered   Fluad Quad(high Dose 65+) 03/07/2019, 04/01/2020   Influenza Split 01/30/2014   Influenza, High Dose  Seasonal PF 02/12/2015, 05/13/2016, 03/23/2017, 03/21/2018   PFIZER(Purple Top)SARS-COV-2 Vaccination 06/29/2019, 07/20/2019, 03/01/2020   Pneumococcal Conjugate-13 08/08/2014   Pneumococcal Polysaccharide-23 05/27/2010, 09/10/2015   Tdap 05/27/2010, 08/01/2020   Zoster, Live 05/31/2011    Conditions to be addressed/monitored:  Hypertension, Hyperlipidemia, Diabetes, Hypothyroidism and Depression  Care Plan : General Pharmacy (Adult)  Updates made by Germaine Pomfret, [place] since 03/04/2021 12:00 AM     Problem: Hypertension, Hyperlipidemia, Diabetes, Hypothyroidism and Depression   Priority: High     Long-Range Goal: Patient-Specific Goal   Start Date: 07/16/2020  Expected End Date: 03/04/2022  This Visit's Progress: On track  Recent Progress: On track  Priority: High  Note:   Current Barriers:  No barriers noted  Pharmacist Clinical Goal(s):  Over the next 90 days, patient will maintain control of Diabetes as evidenced by A1c less than 7%  through collaboration with PharmD and provider.   Interventions: 1:1 collaboration with [name], MD regarding development and update of comprehensive plan of care as evidenced by provider attestation and co-signature Inter-disciplinary care team collaboration (see longitudinal plan of care) Comprehensive medication review performed; medication list updated in electronic medical record  Hypertension (BP goal <140/90) -controlled -Current treatment: Losartan 100 mg daily  -Medications previously tried: NA  -Current home readings: NA -Current dietary habits: Cut back significantly on food intake.  -Current exercise habits: recently increased activity, reports walking or stationary bike, 4-5 times weekly  -Denies hypotensive/hypertensive symptoms -Educated on Daily salt intake goal < 2300 mg; Exercise goal of 150 minutes per week; Importance of home blood pressure monitoring; -Counseled to monitor BP at home weekly, document, and  provide log at future appointments -Recommended to continue current medication  Hyperlipidemia: (LDL goal < 70) -controlled -Current treatment: Atorvastatin 80 mg daily  -Medications previously tried: NA  -Educated on Importance of limiting foods high in cholesterol; -Recommended to continue current medication  Diabetes (A1c goal <7%) -Controlled -Current medications: Metformin XR 750 mg twice daily  -Medications previously tried: Glipizide (Hypoglycemia)   -Current home glucose readings fasting glucose: 124, 140, typically 130s-140s,  post prandial glucose: NA -Denies hypoglycemic/hyperglycemic symptoms -Educated on  A1c and  blood sugar goals; Exercise goal of 150 minutes per week; -Counseled to check feet daily and get yearly eye exams -Recommended to continue current medication  Depression/Anxiety (Goal: maintain stable mood) -controlled -Current treatment: Citalopram 40 mg daily  Clonazepam 0.5 mg twice daily as needed -Medications previously tried/failed: NA -PHQ9: 0 -GAD7: NA -Educated on Benefits of medication for symptom control Benefits of cognitive-behavioral therapy with or without medication. Patient not interested in CBT at this time.  -Recommended decreasing citalopram to 20 mg daily to reduce risk of QTc prolongation  Hypothyroidism (Goal: Maintain thyroid function) -Controlled -Current treatment  Euthyrox 75 mcg daily  -Medications previously tried: NA  -Recommended to continue current medication  Chronic Kidney Disease Stage 3a  -All medications assessed for renal dosing and appropriateness in chronic kidney disease. -Recommended to continue current medication  Patient Goals/Self-Care Activities Over the next 90 days, patient will:  - take medications as prescribed check glucose daily (Fasting), document, and provide at future appointments check blood pressure every 1-2 weeks, document, and provide at future appointments  Follow Up Plan:  Telephone follow up appointment with care management team member scheduled for:  09/09/21 at 3:00 PM       Medication Assistance: None required.  Patient affirms current coverage meets needs.  Patient's preferred pharmacy is:  [facility] [address] [place] [address] [place]) [place] [address] Phone: [phone] Fax: [phone]  [id number] - [place]: [phone]: [phone] [place] [address]: [phone]: [phone] Uses pill box? Yes Pt endorses 100% compliance  We discussed: Current pharmacy is preferred with insurance plan and patient is satisfied with pharmacy services Patient decided to: Continue current medication management strategy  Follow Up:  Patient agrees to Care Plan and Follow-up.  Plan: Telephone follow up appointment with care management team member scheduled for:  09/09/21 at 3:00 PM  [phone]

## 2021-03-04 NOTE — Patient Instructions (Signed)
Visit Information It was great speaking with you today!  Please let me know if you have any questions about our visit.   Goals Addressed             This Visit's Progress    Monitor and Manage My Blood Sugar-Diabetes Type 2   On track    Timeframe:  Long-Range Goal Priority:  High Start Date:    07/16/2020                         Expected End Date:  01/15/2022                     Follow Up within 90 days    - check blood sugar at prescribed times - check blood sugar if I feel it is too high or too low    Why is this important?   Checking your blood sugar at home helps to keep it from getting very high or very low.  Writing the results in a diary or log helps the doctor know how to care for you.  Your blood sugar log should have the time, date and the results.  Also, write down the amount of insulin or other medicine that you take.  Other information, like what you ate, exercise done and how you were feeling, will also be helpful.     Notes:         Patient Care Plan: General Pharmacy (Adult)     Problem Identified: Hypertension, Hyperlipidemia, Diabetes, Hypothyroidism and Depression   Priority: High     Long-Range Goal: Patient-Specific Goal   Start Date: 07/16/2020  Expected End Date: 03/04/2022  This Visit's Progress: On track  Recent Progress: On track  Priority: High  Note:   Current Barriers:  No barriers noted  Pharmacist Clinical Goal(s):  Over the next 90 days, patient will maintain control of Diabetes as evidenced by A1c less than 7%  through collaboration with PharmD and provider.   Interventions: 1:1 collaboration with Alba Cory, MD regarding development and update of comprehensive plan of care as evidenced by provider attestation and co-signature Inter-disciplinary care team collaboration (see longitudinal plan of care) Comprehensive medication review performed; medication list updated in electronic medical record  Hypertension (BP goal  <140/90) -controlled -Current treatment: Losartan 100 mg daily  -Medications previously tried: NA  -Current home readings: NA -Current dietary habits: Cut back significantly on food intake.  -Current exercise habits: recently increased activity, reports walking or stationary bike, 4-5 times weekly  -Denies hypotensive/hypertensive symptoms -Educated on Daily salt intake goal < 2300 mg; Exercise goal of 150 minutes per week; Importance of home blood pressure monitoring; -Counseled to monitor BP at home weekly, document, and provide log at future appointments -Recommended to continue current medication  Hyperlipidemia: (LDL goal < 70) -controlled -Current treatment: Atorvastatin 80 mg daily  -Medications previously tried: NA  -Educated on Importance of limiting foods high in cholesterol; -Recommended to continue current medication  Diabetes (A1c goal <7%) -Controlled -Current medications: Metformin XR 750 mg twice daily  -Medications previously tried: Glipizide (Hypoglycemia)   -Current home glucose readings fasting glucose: 124, 140, typically 130s-140s,  post prandial glucose: NA -Denies hypoglycemic/hyperglycemic symptoms -Educated on  A1c and blood sugar goals; Exercise goal of 150 minutes per week; -Counseled to check feet daily and get yearly eye exams -Recommended to continue current medication  Depression/Anxiety (Goal: maintain stable mood) -controlled -Current treatment: Citalopram 40 mg daily  Clonazepam 0.5 mg twice daily as needed -Medications previously tried/failed: NA -PHQ9: 0 -GAD7: NA -Educated on Benefits of medication for symptom control Benefits of cognitive-behavioral therapy with or without medication. Patient not interested in CBT at this time.  -Recommended decreasing citalopram to 20 mg daily to reduce risk of QTc prolongation  Hypothyroidism (Goal: Maintain thyroid function) -Controlled -Current treatment  Euthyrox 75 mcg daily   -Medications previously tried: NA  -Recommended to continue current medication  Chronic Kidney Disease Stage 3a  -All medications assessed for renal dosing and appropriateness in chronic kidney disease. -Recommended to continue current medication  Patient Goals/Self-Care Activities Over the next 90 days, patient will:  - take medications as prescribed check glucose daily (Fasting), document, and provide at future appointments check blood pressure every 1-2 weeks, document, and provide at future appointments  Follow Up Plan: Telephone follow up appointment with care management team member scheduled for:  09/09/21 at 3:00 PM      Patient agreed to services and verbal consent obtained.   Patient verbalizes understanding of instructions provided today and agrees to view in MyChart.   Cheyenne Adas, CPP Clinical Pharmacist [facility] [phone]

## 2021-03-06 DIAGNOSIS — I152 Hypertension secondary to endocrine disorders: Secondary | ICD-10-CM

## 2021-03-06 DIAGNOSIS — E1159 Type 2 diabetes mellitus with other circulatory complications: Secondary | ICD-10-CM

## 2021-03-06 DIAGNOSIS — R809 Proteinuria, unspecified: Secondary | ICD-10-CM | POA: Diagnosis not present

## 2021-03-06 DIAGNOSIS — U071 COVID-19: Secondary | ICD-10-CM | POA: Diagnosis not present

## 2021-03-06 DIAGNOSIS — E1129 Type 2 diabetes mellitus with other diabetic kidney complication: Secondary | ICD-10-CM

## 2021-03-13 ENCOUNTER — Other Ambulatory Visit: Payer: Self-pay | Admitting: Family Medicine

## 2021-03-13 DIAGNOSIS — F325 Major depressive disorder, single episode, in full remission: Secondary | ICD-10-CM

## 2021-04-02 ENCOUNTER — Ambulatory Visit: Payer: Medicare Other

## 2021-04-16 ENCOUNTER — Telehealth: Payer: Self-pay

## 2021-04-16 DIAGNOSIS — Z23 Encounter for immunization: Secondary | ICD-10-CM | POA: Diagnosis not present

## 2021-04-16 NOTE — Progress Notes (Signed)
Chronic Care Management Pharmacy Assistant   Name: Megan Short  MRN: [id number] DOB: [birth year]  Reason for Encounter: Diabetes Disease State Call   Recent office visits:  None ID  Recent consult visits:  None ID  Hospital visits:  None in previous 6 months  Medications: Outpatient Encounter Medications as of 04/16/2021  Medication Sig   aspirin 81 MG tablet Take by mouth.   atorvastatin (LIPITOR) 80 MG tablet Take 1 tablet (80 mg total) by mouth every evening.   blood glucose meter kit and supplies Dispense based on patient and insurance preference. Check bid daily as directed. (FOR ICD-10 E10.9, E11.9).   cholecalciferol (VITAMIN D3) 25 MCG (1000 UNIT) tablet Take 1,000 Units by mouth daily.   citalopram (CELEXA) 40 MG tablet Take 1 tablet by mouth once daily   clonazePAM (KLONOPIN) 0.5 MG tablet Take 1 tablet (0.5 mg total) by mouth 2 (two) times daily as needed.   EUTHYROX 75 MCG tablet TAKE 1 TABLET BY MOUTH ONCE DAILY BEFORE  BREAKFAST   glucose blood (ACCU-CHEK GUIDE) test strip 1 each by Other route 2 (two) times daily as needed for other.   loratadine (CLARITIN) 10 MG tablet Take 1 tablet (10 mg total) by mouth daily.   losartan (COZAAR) 100 MG tablet Take 1 tablet (100 mg total) by mouth daily.   metFORMIN (GLUCOPHAGE-XR) 750 MG 24 hr tablet Take 1 tablet (750 mg total) by mouth 2 (two) times daily.   No facility-administered encounter medications on file as of 04/16/2021.   Care Gaps: Zoster Vaccines COVID-19 Vaccine Booster 4 Influenza Vaccine  Star Rating Drugs: Atorvastatin 80 mg last filled on 04/04/2021 for a 90-Day supply with [facility]-No refills left Losartan 100 mg last filled on 02/08/2021 for a 90-Day supply with [place] Metformin 750 mg last filled on 02/08/2021 for a 90-Day supply with [place] isn't updated with patient's refills so I reached out to [facility] to get the correct refill dates.  Recent Relevant  Labs: Lab Results  Component Value Date/Time   HGBA1C 6.7 (A) 01/07/2021 10:47 AM   HGBA1C 7.4 (A) 08/01/2020 09:10 AM   HGBA1C 6.5 07/30/2019 09:44 AM   HGBA1C 6.4 (H) 03/07/2019 12:00 AM   HGBA1C 6.2 10/24/2018 09:22 AM   HGBA1C 6.2 11/17/2017 09:10 AM   HGBA1C 6.8 09/04/2015 10:23 AM   MICROALBUR 4.8 01/07/2021 11:14 AM   MICROALBUR 3.3 01/29/2020 10:34 AM   MICROALBUR 50 06/12/2015 10:18 AM    Kidney Function Lab Results  Component Value Date/Time   CREATININE 1.04 (H) 01/07/2021 11:14 AM   CREATININE 0.97 (H) 01/29/2020 10:34 AM   GFRNONAA 58 (L) 01/29/2020 10:34 AM   GFRAA 67 01/29/2020 10:34 AM    Current antihyperglycemic regimen:  Metformin 750 mg 1 tablet twice daily  What recent interventions/DTPs have been made to improve glycemic control:  None ID  Have there been any recent hospitalizations or ED visits since last visit with CPP? No  Patient denies hypoglycemic symptoms, including Pale, Sweaty, Shaky, Hungry, Nervous/irritable, and Vision changes  Patient denies hyperglycemic symptoms, including blurry vision, excessive thirst, fatigue, polyuria, and weakness  How often are you checking your blood sugar? once daily  What are your blood sugars ranging?  Fasting: 120-130's  During the week, how often does your blood glucose drop below 70? Never  Are you checking your feet daily/regularly? Yes  Adherence Review: Is the patient currently on a STATIN medication? Yes Is the patient currently on ACE/ARB  medication? Yes Does the patient have >5 day gap between last estimated fill dates? No  Patient reports that she is doing well. Patient reported that she received her flu injection at [facility] on 04/16/2021. Patient denies having any ill symptoms. She stated that her blood sugar numbers have been really good. She has been watching what she eats. Patient did report for 2023 she is switching to [facility] plan, but she is still keeping Dr. Ancil Boozer as her  provider. Patient stated that she is getting ready for the holidays, but still intend to watch her food intake. Patient reports taking all medications as directed, and denies needing any refills at this time. Patient has no other concerns or issues at this time, and encouraged to give me a call if she needs anything.   Patient scheduled for telephone follow-up with [name], CPP on 09/09/2021 @ [place], CPA/CMA Catering manager Phone: [phone]

## 2021-04-21 ENCOUNTER — Other Ambulatory Visit: Payer: Self-pay

## 2021-04-21 ENCOUNTER — Ambulatory Visit (INDEPENDENT_AMBULATORY_CARE_PROVIDER_SITE_OTHER): Payer: Medicare Other

## 2021-04-21 VITALS — BP 138/86 | HR 99 | Temp 97.8°F | Resp 16 | Ht 59.0 in | Wt 148.1 lb

## 2021-04-21 DIAGNOSIS — Z0000 Encounter for general adult medical examination without abnormal findings: Secondary | ICD-10-CM | POA: Diagnosis not present

## 2021-04-21 NOTE — Progress Notes (Signed)
Subjective:   Megan Short is a 75 y.o. female who presents for Medicare Annual (Subsequent) preventive examination.  Review of Systems     Cardiac Risk Factors include: advanced age (>55mn, >>67women);diabetes mellitus;dyslipidemia;hypertension;obesity (BMI >30kg/m2)     Objective:    Today's Vitals   04/21/21 0846  BP: 138/86  Pulse: 99  Resp: 16  Temp: 97.8 F (36.6 C)  TempSrc: Oral  SpO2: 98%  Weight: 148 lb 1.6 oz (67.2 kg)  Height: 4' 11"  (1.499 m)   Body mass index is 29.91 kg/m.  Advanced Directives 04/21/2021 05/07/2020 04/01/2020 03/27/2019 12/14/2016 09/13/2016 05/13/2016  Does Patient Have a Medical Advance Directive? Yes No No Yes No No No  Type of [place]amed[place] will - - [place] will - - -  Copy of [place]? No - copy requested - - No - copy requested - - -  Would patient like information on creating a medical advance directive? - No - Patient declined Yes (MAU/Ambulatory/Procedural Areas - Information given) - - - -    Current Medications (verified) Outpatient Encounter Medications as of 04/21/2021  Medication Sig   aspirin 81 MG tablet Take by mouth.   atorvastatin (LIPITOR) 80 MG tablet Take 1 tablet (80 mg total) by mouth every evening.   blood glucose meter kit and supplies Dispense based on patient and insurance preference. Check bid daily as directed. (FOR ICD-10 E10.9, E11.9).   cholecalciferol (VITAMIN D3) 25 MCG (1000 UNIT) tablet Take 1,000 Units by mouth daily.   citalopram (CELEXA) 40 MG tablet Take 1 tablet by mouth once daily   clonazePAM (KLONOPIN) 0.5 MG tablet Take 1 tablet (0.5 mg total) by mouth 2 (two) times daily as needed.   EUTHYROX 75 MCG tablet TAKE 1 TABLET BY MOUTH ONCE DAILY BEFORE  BREAKFAST   glucose blood (ACCU-CHEK GUIDE) test strip 1 each by Other route 2 (two) times daily as needed for other.   losartan (COZAAR) 100 MG tablet Take  1 tablet (100 mg total) by mouth daily.   metFORMIN (GLUCOPHAGE-XR) 750 MG 24 hr tablet Take 1 tablet (750 mg total) by mouth 2 (two) times daily.   loratadine (CLARITIN) 10 MG tablet Take 1 tablet (10 mg total) by mouth daily.   No facility-administered encounter medications on file as of 04/21/2021.    Allergies (verified) Sulfa antibiotics   History: Past Medical History:  Diagnosis Date   Anxiety    Depression    Diabetes mellitus without complication ([place]    Hyperlipidemia    Hypertension    Hypothyroidism    Past Surgical History:  Procedure Laterality Date   BREAST BIOPSY Right    negative times 2   COLONOSCOPY WITH PROPOFOL N/A 01/20/2015   Procedure: COLONOSCOPY WITH PROPOFOL;  Surgeon: RManya Silvas MD;  [place]  Service: [place]oscopy;  Laterality: N/A;   COLONOSCOPY WITH PROPOFOL N/A 05/07/2020   Procedure: COLONOSCOPY WITH PROPOFOL;  Surgeon: Toledo, TBenay Pike MD;  Location: [facility] ENDOSCOPY;  Service: Gastroenterology;  Laterality: N/A;   LITHOTRIPSY Left 12/2013   Family History  Problem Relation Age of Onset   Osteoarthritis Mother    Kidney failure Mother    Failure to thrive Mother    Osteoarthritis Sister    Alzheimer's disease Maternal Aunt    Breast cancer Neg Hx    Social History   Socioeconomic History   Marital status: Married    Spouse name: BMortimer Fries  Number of children: 1   Years of education: Not on file   Highest education level: Some college, no degree  Occupational History   Occupation: retired     Comment: Therapist, art rep   Tobacco Use   Smoking status: Never   Smokeless tobacco: Never   Tobacco comments:    Smoking cessation materials not required  Vaping Use   Vaping Use: Never used  Substance and Sexual Activity   Alcohol use: No    Alcohol/week: 0.0 standard drinks   Drug use: No   Sexual activity: Not Currently  Other Topics Concern   Not on file  Social History Narrative   Married, they had one  son that died with complications of lupus   Social Determinants of Health   Financial Resource Strain: Low Risk    Difficulty of Paying Living Expenses: Not hard at all  Food Insecurity: No Food Insecurity   Worried About Charity fundraiser in the Last Year: Never true   [place] in the Last Year: Never true  Transportation Needs: No Transportation Needs   Lack of Transportation (Medical): No   Lack of Transportation (Non-Medical): No  Physical Activity: Sufficiently Active   Days of Exercise per Week: 5 days   Minutes of Exercise per Session: 30 min  Stress: No Stress Concern Present   Feeling of Stress : Not at all  Social Connections: Moderately Integrated   Frequency of Communication with Friends and Family: More than three times a week   Frequency of Social Gatherings with Friends and Family: Three times a week   Attends Religious Services: More than 4 times per year   Active Member of Clubs or Organizations: No   Attends Archivist Meetings: Never   Marital Status: Married    Tobacco Counseling Counseling given: No Tobacco comments: Smoking cessation materials not required   Clinical Intake:  Pre-visit preparation completed: Yes  Pain : No/denies pain     BMI - recorded: 29.91 Nutritional Status: BMI 25 -29 Overweight Nutritional Risks: None Diabetes: Yes CBG done?: No Did pt. bring in CBG monitor from home?: No  How often do you need to have someone help you when you read instructions, pamphlets, or other written materials from your doctor or pharmacy?: 1 - Never  Nutrition Risk Assessment:  Has the patient had any N/V/D within the last 2 months?  No  Does the patient have any non-healing wounds?  No  Has the patient had any unintentional weight loss or weight gain?  No   Diabetes:  Is the patient diabetic?  Yes  If diabetic, was a CBG obtained today?  No  Did the patient bring in their glucometer from home?  No  How often do you  monitor your CBG's? daily.   Financial Strains and Diabetes Management:  Are you having any financial strains with the device, your supplies or your medication? No .  Does the patient want to be seen by Chronic Care Management for management of their diabetes?  No  Would the patient like to be referred to a Nutritionist or for Diabetic Management?  No   Diabetic Exams:  Diabetic Eye Exam: Completed 07/28/20 negative retinopathy.   Diabetic Foot Exam: Completed 01/07/21.  Interpreter Needed?: No  Information entered by :: Clemetine Marker LPN   Activities of Daily Living In your present state of health, do you have any difficulty performing the following activities: 04/21/2021 01/07/2021  Hearing? N N  Vision?  N N  Difficulty concentrating or making decisions? N N  Walking or climbing stairs? N N  Dressing or bathing? N N  Doing errands, shopping? N N  Preparing Food and eating ? N -  Using the Toilet? N -  In the past six months, have you accidently leaked urine? N -  Do you have problems with loss of bowel control? N -  Managing your Medications? N -  Managing your Finances? N -  Housekeeping or managing your Housekeeping? N -  Some recent data might be hidden    Patient Care Team: [name], MD as PCP - General (Family Medicine) Gardiner Barefoot, DPM as Consulting Physician (Podiatry) Baxter Kail, MD as Consulting Physician (Dermatology) Germaine Pomfret, [facility] as Pharmacist (Pharmacist)  Indicate any recent Medical Services you may have received from other than [facility] providers in the past year (date may be approximate).     Assessment:   This is a routine wellness examination for Megan Short.  Hearing/Vision screen Hearing Screening - Comments:: Pt denies hearing difficulty Vision Screening - Comments:: Annual vision screenings done at [facility] Dr. Wallace Going   Dietary issues and exercise activities discussed: Current Exercise Habits: Home exercise routine,  Type of exercise: treadmill;walking, Time (Minutes): 30, Frequency (Times/Week): 5, Weekly Exercise (Minutes/Week): 150, Intensity: Moderate, Exercise limited by: None identified   Goals Addressed             This Visit's Progress    DIET - INCREASE WATER INTAKE   On track    Recommend drinking 6-8 glasses of water per day      Weight (lb) < 135 lb (61.2 kg)   148 lb 1.6 oz (67.2 kg)    Pt states she would like to lose weight with healthy eating and physical activity        Depression Screen PHQ 2/9 Scores 04/21/2021 01/07/2021 08/01/2020 04/01/2020 01/29/2020 07/30/2019 03/27/2019  PHQ - 2 Score 0 0 0 0 1 0 0  PHQ- 9 Score - 0 0 - 5 0 -    Fall Risk Fall Risk  04/21/2021 01/07/2021 08/01/2020 04/01/2020 01/29/2020  Falls in the past year? 0 0 0 0 0  Number falls in past yr: 0 0 0 0 0  Injury with Fall? 0 0 0 0 0  Comment - - - - -  Risk for fall due to : No Fall Risks - - No Fall Risks -  Follow up Falls prevention discussed - - Falls prevention discussed -    FALL RISK PREVENTION PERTAINING TO THE HOME:  Any stairs in or around the home? Yes  If so, are there any without handrails? No  Home free of loose throw rugs in walkways, pet beds, electrical cords, etc? Yes  Adequate lighting in your home to reduce risk of falls? Yes   ASSISTIVE DEVICES UTILIZED TO PREVENT FALLS:  Life alert? No  Use of a cane, walker or w/c? No  Grab bars in the bathroom? Yes  Shower chair or bench in shower? No  Elevated toilet seat or a handicapped toilet? Yes   TIMED UP AND GO:  Was the test performed? Yes .  Length of time to ambulate 10 feet: 5 sec.   Gait steady and fast without use of assistive device  Cognitive Function: Normal cognitive status assessed by direct observation by this Nurse Health Advisor. No abnormalities found.       6CIT Screen 04/01/2020 03/27/2019 03/24/2018  What Year? 0 points 0 points  0 points  What month? 0 points 0 points 0 points  What time? 0 points 0  points 0 points  Count back from 20 0 points 0 points 0 points  Months in reverse 0 points 0 points 0 points  Repeat phrase 0 points 0 points 4 points  Total Score 0 0 4    Immunizations Immunization History  Administered Date(s) Administered   Fluad Quad(high Dose 65+) 03/07/2019, 04/01/2020   Influenza Split 01/30/2014   Influenza, High Dose Seasonal PF 02/12/2015, 05/13/2016, 03/23/2017, 03/21/2018, 04/16/2021   PFIZER(Purple Top)SARS-COV-2 Vaccination 06/29/2019, 07/20/2019, 03/01/2020, 10/24/2020   Pneumococcal Conjugate-13 08/08/2014   Pneumococcal Polysaccharide-23 05/27/2010, 09/10/2015   Tdap 05/27/2010, 08/01/2020   Zoster, Live 05/31/2011    TDAP status: Up to date  Flu Vaccine status: Up to date  Pneumococcal vaccine status: Up to date  Covid-19 vaccine status: Completed vaccines  Qualifies for Shingles Vaccine? Yes   Zostavax completed Yes   Shingrix Completed?: No.    Education has been provided regarding the importance of this vaccine. Patient has been advised to call insurance company to determine out of pocket expense if they have not yet received this vaccine. Advised may also receive vaccine at local pharmacy or Health Dept. Verbalized acceptance and understanding.  Screening Tests Health Maintenance  Topic Date Due   Zoster Vaccines- Shingrix (1 of 2) Never done   COVID-19 Vaccine (5 - Booster for Pfizer series) 12/19/2020   HEMOGLOBIN A1C  07/10/2021   OPHTHALMOLOGY EXAM  07/28/2021   MAMMOGRAM  12/15/2021   FOOT EXAM  01/07/2022   COLONOSCOPY (Pts 45-55yr Insurance coverage will need to be confirmed)  05/07/2025   TETANUS/TDAP  08/01/2030   Pneumonia Vaccine 75 Years old  Completed   INFLUENZA VACCINE  Completed   DEXA SCAN  Completed   Hepatitis C Screening  Completed   HPV VACCINES  Aged Out    Health Maintenance  Health Maintenance Due  Topic Date Due   Zoster Vaccines- Shingrix (1 of 2) Never done   COVID-19 Vaccine (5 - Booster for  Pfizer series) 12/19/2020    Colorectal cancer screening: Type of screening: Colonoscopy. Completed 05/07/20. Repeat every 5 years  Mammogram status: Completed 12/15/20. Repeat every year  Bone Density status: Completed 2015. Results reflect: Bone density results: NORMAL. Repeat every 2 years.  Lung Cancer Screening: (Low Dose CT Chest recommended if Age 74-80years, 30 pack-year currently smoking OR have quit w/in 15years.) does not qualify.  Additional Screening:  Hepatitis C Screening: does qualify; Completed 03/07/12  Vision Screening: Recommended annual ophthalmology exams for early detection of glaucoma and other disorders of the eye. Is the patient up to date with their annual eye exam?  Yes  Who is the provider or what is the [facility] of [facility] the [facility]   Dental Screening: Recommended annual dental exams for proper oral hygiene  Community Resource Referral / Chronic Care Management: CRR required this visit?  No   CCM required this visit?  No      Plan:     I have personally reviewed and noted the following in the patient's chart:   Medical and social history Use of alcohol, tobacco or illicit drugs  Current medications and supplements including opioid prescriptions.  Functional ability and status Nutritional status Physical activity Advanced directives List of other physicians Hospitalizations, surgeries, and ER visits in previous 12 months Vitals Screenings to include cognitive, depression, and falls Referrals and  appointments  In addition, I have reviewed and discussed with patient certain preventive protocols, quality metrics, and best practice recommendations. A written personalized care plan for preventive services as well as general preventive health recommendations were provided to patient.     Clemetine Marker, LPN   [id number]   Nurse Notes: pt c/o of wart on right index finger since August. Pt  states she has discussed with Dr. Ancil Boozer and will try OTC products; advised to contact us if needed prior to next appt.

## 2021-04-21 NOTE — Patient Instructions (Signed)
[name] , Thank you for taking time to come for your Medicare Wellness Visit. I appreciate your ongoing commitment to your health goals. Please review the following plan we discussed and let me know if I can assist you in the future.   Screening recommendations/referrals: Colonoscopy: done 05/07/20 Mammogram: done 12/22/20 Bone Density: done 2015. Please call [phone] to schedule your bone density screening.  Recommended yearly ophthalmology/optometry visit for glaucoma screening and checkup Recommended yearly dental visit for hygiene and checkup  Vaccinations: Influenza vaccine: done 04/16/21 Pneumococcal vaccine: done 09/10/15 Tdap vaccine: done 08/01/20 Shingles vaccine: Shingrix discussed. Please contact your pharmacy for coverage information.  Covid-19:done 06/29/19, 07/20/19, 03/01/20 & 10/24/20  Advanced directives: Please bring a copy of your health care power of attorney and living will to the office at your convenience.   Conditions/risks identified: Recommend healthy eating and physical activity for desired weight loss  Next appointment: Follow up in one year for your annual wellness visit    Preventive Care 65 Years and Older, Female Preventive care refers to lifestyle choices and visits with your health care provider that can promote health and wellness. What does preventive care include? A yearly physical exam. This is also called an annual well check. Dental exams once or twice a year. Routine eye exams. Ask your health care provider how often you should have your eyes checked. Personal lifestyle choices, including: Daily care of your teeth and gums. Regular physical activity. Eating a healthy diet. Avoiding tobacco and drug use. Limiting alcohol use. Practicing safe sex. Taking low-dose aspirin every day. Taking vitamin and mineral supplements as recommended by your health care provider. What happens during an annual well check? The services and screenings done by  your health care provider during your annual well check will depend on your age, overall health, lifestyle risk factors, and family history of disease. Counseling  Your health care provider may ask you questions about your: Alcohol use. Tobacco use. Drug use. Emotional well-being. Home and relationship well-being. Sexual activity. Eating habits. History of falls. Memory and ability to understand (cognition). Work and work Astronomer. Reproductive health. Screening  You may have the following tests or measurements: Height, weight, and BMI. Blood pressure. Lipid and cholesterol levels. These may be checked every 5 years, or more frequently if you are over 50 years old. Skin check. Lung cancer screening. You may have this screening every year starting at age 52 if you have a 30-pack-year history of smoking and currently smoke or have quit within the past 15 years. Fecal occult blood test (FOBT) of the stool. You may have this test every year starting at age 20. Flexible sigmoidoscopy or colonoscopy. You may have a sigmoidoscopy every 5 years or a colonoscopy every 10 years starting at age 74. Hepatitis C blood test. Hepatitis B blood test. Sexually transmitted disease (STD) testing. Diabetes screening. This is done by checking your blood sugar (glucose) after you have not eaten for a while (fasting). You may have this done every 1-3 years. Bone density scan. This is done to screen for osteoporosis. You may have this done starting at age 40. Mammogram. This may be done every 1-2 years. Talk to your health care provider about how often you should have regular mammograms. Talk with your health care provider about your test results, treatment options, and if necessary, the need for more tests. Vaccines  Your health care provider may recommend certain vaccines, such as: Influenza vaccine. This is recommended every year. Tetanus, diphtheria, and acellular  pertussis (Tdap, Td) vaccine. You  may need a Td booster every 10 years. Zoster vaccine. You may need this after age 1. Pneumococcal 13-valent conjugate (PCV13) vaccine. One dose is recommended after age 41. Pneumococcal polysaccharide (PPSV23) vaccine. One dose is recommended after age 75. Talk to your health care provider about which screenings and vaccines you need and how often you need them. This information is not intended to replace advice given to you by your health care provider. Make sure you discuss any questions you have with your health care provider. Document Released: 06/20/2015 Document Revised: 02/11/2016 Document Reviewed: 03/25/2015 Elsevier Interactive Patient Education  2017 [employer].  Fall Prevention in the Home Falls can cause injuries. They can happen to people of all ages. There are many things you can do to make your home safe and to help prevent falls. What can I do on the outside of my home? Regularly fix the edges of walkways and driveways and fix any cracks. Remove anything that might make you trip as you walk through a door, such as a raised step or threshold. Trim any bushes or trees on the path to your home. Use bright outdoor lighting. Clear any walking paths of anything that might make someone trip, such as rocks or tools. Regularly check to see if handrails are loose or broken. Make sure that both sides of any steps have handrails. Any raised decks and porches should have guardrails on the edges. Have any leaves, snow, or ice cleared regularly. Use sand or salt on walking paths during winter. Clean up any spills in your garage right away. This includes oil or grease spills. What can I do in the bathroom? Use night lights. Install grab bars by the toilet and in the tub and shower. Do not use towel bars as grab bars. Use non-skid mats or decals in the tub or shower. If you need to sit down in the shower, use a plastic, non-slip stool. Keep the floor dry. Clean up any water that spills  on the floor as soon as it happens. Remove soap buildup in the tub or shower regularly. Attach bath mats securely with double-sided non-slip rug tape. Do not have throw rugs and other things on the floor that can make you trip. What can I do in the bedroom? Use night lights. Make sure that you have a light by your bed that is easy to reach. Do not use any sheets or blankets that are too big for your bed. They should not hang down onto the floor. Have a firm chair that has side arms. You can use this for support while you get dressed. Do not have throw rugs and other things on the floor that can make you trip. What can I do in the kitchen? Clean up any spills right away. Avoid walking on wet floors. Keep items that you use a lot in easy-to-reach places. If you need to reach something above you, use a strong step stool that has a grab bar. Keep electrical cords out of the way. Do not use floor polish or wax that makes floors slippery. If you must use wax, use non-skid floor wax. Do not have throw rugs and other things on the floor that can make you trip. What can I do with my stairs? Do not leave any items on the stairs. Make sure that there are handrails on both sides of the stairs and use them. Fix handrails that are broken or loose. Make sure that handrails  are as long as the stairways. Check any carpeting to make sure that it is firmly attached to the stairs. Fix any carpet that is loose or worn. Avoid having throw rugs at the top or bottom of the stairs. If you do have throw rugs, attach them to the floor with carpet tape. Make sure that you have a light switch at the top of the stairs and the bottom of the stairs. If you do not have them, ask someone to add them for you. What else can I do to help prevent falls? Wear shoes that: Do not have high heels. Have rubber bottoms. Are comfortable and fit you well. Are closed at the toe. Do not wear sandals. If you use a stepladder: Make  sure that it is fully opened. Do not climb a closed stepladder. Make sure that both sides of the stepladder are locked into place. Ask someone to hold it for you, if possible. Clearly mark and make sure that you can see: Any grab bars or handrails. First and last steps. Where the edge of each step is. Use tools that help you move around (mobility aids) if they are needed. These include: Canes. Walkers. Scooters. Crutches. Turn on the lights when you go into a dark area. Replace any light bulbs as soon as they burn out. Set up your furniture so you have a clear path. Avoid moving your furniture around. If any of your floors are uneven, fix them. If there are any pets around you, be aware of where they are. Review your medicines with your doctor. Some medicines can make you feel dizzy. This can increase your chance of falling. Ask your doctor what other things that you can do to help prevent falls. This information is not intended to replace advice given to you by your health care provider. Make sure you discuss any questions you have with your health care provider. Document Released: 03/20/2009 Document Revised: 10/30/2015 Document Reviewed: 06/28/2014 Elsevier Interactive Patient Education  2017 [employer].

## 2021-04-27 DIAGNOSIS — Z23 Encounter for immunization: Secondary | ICD-10-CM | POA: Diagnosis not present

## 2021-05-04 ENCOUNTER — Other Ambulatory Visit: Payer: Self-pay | Admitting: Family Medicine

## 2021-05-04 DIAGNOSIS — F325 Major depressive disorder, single episode, in full remission: Secondary | ICD-10-CM

## 2021-05-04 DIAGNOSIS — E039 Hypothyroidism, unspecified: Secondary | ICD-10-CM

## 2021-05-13 DIAGNOSIS — Z20822 Contact with and (suspected) exposure to covid-19: Secondary | ICD-10-CM | POA: Diagnosis not present

## 2021-05-19 DIAGNOSIS — Z20822 Contact with and (suspected) exposure to covid-19: Secondary | ICD-10-CM | POA: Diagnosis not present

## 2021-07-16 NOTE — Progress Notes (Signed)
Name: Megan Short   MRN: [id number]    DOB: 17-May-1946   Date:07/17/2021       Progress Note  Subjective  Chief Complaint  Follow Up  HPI  HTN: taking medication - Losartan  daily  and denies side effects. No chest pain, no palpitation or dizziness.  BP is at goal today   Rash she has a long history of rash on legs, but getting worse, very itchy and now has a dry patch on right anterior chest wall.    Hypothyroidism: denies fatigue, hair loss, but she has dry skin, no constipation She has states only occasionally chokes when eating fast, no dysphagia. She is taking Levothyroxine as prescribed last TSH at goal, we will recheck labs today   Bowel incontinence: she states it has happened twice in the past 4 months,  she didn't even feel it until she sat down on the commode and it was dirty , she checked and underwear  was dirty also, smeared in stools . She denies no balance problems, no tingling or numbness. No blood in stools.    DMII:  She is exercising again, eating smaller portions, she is on higher dose of Metformin at 1500 mg  A1C has gone up from 6.5 % to 7.5 % ,7.4 % ,last visit was down  to 6.7 % .   She denies polyphagia, polydipsia, or polyuria.. She is taking ARB for urine micro. l .Seen by  Nephrologist - Dr. Candiss Norse - in the pas but has been released. Hyperparathyroidism resolved. Glucose is in the mid 100's lately    Major Depression: it was caused by death of her son 15 years ago ( complications of lupus died at age 61) , she is doing well on Celexa and BZD's very seldom - last rx was given one year ago . She denies anhedonia, she has been going to the gym multiple times a week, denies crying spells,  still has difficulty making decisions ( like picking out an outfit) but not about finances. Her mother died 01-Mar-2020. She has noticed some difficulty falling asleep sometimes but takes bzd prn for that    Dyslipidemia: on Atorvastatin,denies myalgias, doing well on medication. Last  LDL went up a little, we will recheck yearly    Vitamin D deficiency: last labs were at goal, continue supplementation   Trigger finger index: usually when she is using her phone, not bad enough to get injection at this time   Patient Active Problem List   Diagnosis Date Noted   Hypertension associated with type 2 diabetes mellitus ([place]) 59/93/5701   Umbilical hernia without obstruction and without gangrene 09/13/2016   Diabetes mellitus with renal manifestations, controlled ([place]) 09/07/2014   Adult hypothyroidism 09/07/2014   Vitamin D deficiency 09/07/2014   Benign essential HTN 09/07/2014   Microalbuminuria 09/07/2014   Dyslipidemia 09/07/2014   Major depression in full remission ([place]) 09/07/2014   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/07/2014   Calculus of kidney 09/07/2014   Memory loss or impairment 09/07/2014    Past Surgical History:  Procedure Laterality Date   BREAST BIOPSY Right    negative times 2   COLONOSCOPY WITH PROPOFOL N/A 01/20/2015   Procedure: COLONOSCOPY WITH PROPOFOL;  Surgeon: Manya Silvas, MD;  Location: [facility];  Service: Endoscopy;  Laterality: N/A;   COLONOSCOPY WITH PROPOFOL N/A 05/07/2020   Procedure: COLONOSCOPY WITH PROPOFOL;  Surgeon: Toledo, Benay Pike, MD;  Location: [facility] END[facility];  Service: Gastroenterology;  Laterality: N/A;  LITHOTRIPSY Left 12/2013    Family History  Problem Relation Age of Onset   Osteoarthritis Mother    Kidney failure Mother    Failure to thrive Mother    Osteoarthritis Sister    Alzheimer's disease Maternal Aunt    Breast cancer Neg Hx     Social History   Tobacco Use   Smoking status: Never   Smokeless tobacco: Never   Tobacco comments:    Smoking cessation materials not required  Substance Use Topics   Alcohol use: No    Alcohol/week: 0.0 standard drinks     Current Outpatient Medications:    blood glucose meter kit and supplies, Dispense based on patient and insurance  preference. Check bid daily as directed. (FOR ICD-10 E10.9, E11.9)., Disp: 1 each, Rfl: 0   clonazePAM (KLONOPIN) 0.5 MG tablet, Take 1 tablet (0.5 mg total) by mouth 2 (two) times daily as needed., Disp: 30 tablet, Rfl: 0   Zoster Vaccine Adjuvanted (SHINGRIX) injection, Inject 0.5 mLs into the muscle once for 1 dose., Disp: 0.5 mL, Rfl: 1   atorvastatin (LIPITOR) 80 MG tablet, Take 1 tablet (80 mg total) by mouth every evening., Disp: 90 tablet, Rfl: 1   cholecalciferol (VITAMIN D3) 25 MCG (1000 UNIT) tablet, Take 1 tablet (1,000 Units total) by mouth daily., Disp: 90 tablet, Rfl: 1   citalopram (CELEXA) 40 MG tablet, Take 1 tablet (40 mg total) by mouth daily., Disp: 90 tablet, Rfl: 1   glucose blood (ACCU-CHEK GUIDE) test strip, 1 each by Other route 2 (two) times daily as needed for other., Disp: 100 each, Rfl: 2   levothyroxine (SYNTHROID) 75 MCG tablet, Take 1 tablet (75 mcg total) by mouth daily before breakfast., Disp: 90 tablet, Rfl: 0   loratadine (CLARITIN) 10 MG tablet, Take 1 tablet (10 mg total) by mouth daily., Disp: 90 tablet, Rfl: 1   losartan (COZAAR) 100 MG tablet, Take 1 tablet (100 mg total) by mouth daily., Disp: 90 tablet, Rfl: 1   metFORMIN (GLUCOPHAGE-XR) 750 MG 24 hr tablet, Take 1 tablet (750 mg total) by mouth 2 (two) times daily., Disp: 180 tablet, Rfl: 1  Allergies  Allergen Reactions   Sulfa Antibiotics     I personally reviewed active problem list, medication list, allergies, family history, social history, health maintenance with the patient/caregiver today.   ROS  Constitutional: Negative for fever or weight change.  Respiratory: Negative for cough and shortness of breath.   Cardiovascular: Negative for chest pain or palpitations.  Gastrointestinal: Negative for abdominal pain, no bowel changes.  Musculoskeletal: Negative for gait problem or joint swelling.  Skin:positive  for rash.  Neurological: Negative for dizziness or headache.  No other specific  complaints in a complete review of systems (except as listed in HPI above).   Objective  Vitals:   07/17/21 0956  BP: 132/84  Pulse: 90  Resp: 16  SpO2: 99%  Weight: 147 lb (66.7 kg)  Height: 4' 11"  (1.499 m)    Body mass index is 29.69 kg/m.  Physical Exam  Constitutional: Patient appears well-developed and well-nourished. No distress.  HEENT: head atraumatic, normocephalic, pupils equal and reactive to light,  neck supple Cardiovascular: Normal rate, regular rhythm and normal heart sounds.  No murmur heard. No BLE edema. Pulmonary/Chest: Effort normal and breath sounds normal. No respiratory distress. Abdominal: Soft.  There is no tenderness. Psychiatric: Patient has a normal mood and affect. behavior is normal. Judgment and thought content normal.  Rash: dark patch on right  anterior chest wall, eczematous patch on left lower leg, also areas of excoriation noticed    PHQ2/9: Depression screen [facility] 2/9 07/17/2021 04/21/2021 01/07/2021 08/01/2020 04/01/2020  Decreased Interest 0 0 0 0 0  Down, Depressed, Hopeless 1 0 0 0 0  PHQ - 2 Score 1 0 0 0 0  Altered sleeping 1 - 0 0 -  Tired, decreased energy 0 - 0 0 -  Change in appetite 0 - 0 0 -  Feeling bad or failure about yourself  0 - 0 0 -  Trouble concentrating 0 - 0 0 -  Moving slowly or fidgety/restless 0 - 0 0 -  Suicidal thoughts 0 - 0 0 -  PHQ-9 Score 2 - 0 0 -  Difficult doing work/chores - - - - -  Some recent data might be hidden    phq 9 is positive   Fall Risk: Fall Risk  07/17/2021 04/21/2021 01/07/2021 08/01/2020 04/01/2020  Falls in the past year? 0 0 0 0 0  Number falls in past yr: 0 0 0 0 0  Injury with Fall? 0 0 0 0 0  Comment - - - - -  Risk for fall due to : No Fall Risks No Fall Risks - - No Fall Risks  Follow up Falls prevention discussed Falls prevention discussed - - Falls prevention discussed      Functional Status Survey: Is the patient deaf or have difficulty hearing?: No Does the patient  have difficulty seeing, even when wearing glasses/contacts?: No Does the patient have difficulty concentrating, remembering, or making decisions?: No Does the patient have difficulty walking or climbing stairs?: No Does the patient have difficulty dressing or bathing?: No Does the patient have difficulty doing errands alone such as visiting a doctor's office or shopping?: No    Assessment & Plan  1. Controlled type 2 diabetes mellitus with microalbuminuria, without long-term current use of insulin (HCC)  - HgB A1c - losartan (COZAAR) 100 MG tablet; Take 1 tablet (100 mg total) by mouth daily.  Dispense: 90 tablet; Refill: 1 - metFORMIN (GLUCOPHAGE-XR) 750 MG 24 hr tablet; Take 1 tablet (750 mg total) by mouth 2 (two) times daily.  Dispense: 180 tablet; Refill: 1 - glucose blood (ACCU-CHEK GUIDE) test strip; 1 each by Other route 2 (two) times daily as needed for other.  Dispense: 100 each; Refill: 2  2. Adult hypothyroidism  - levothyroxine (SYNTHROID) 75 MCG tablet; Take 1 tablet (75 mcg total) by mouth daily before breakfast.  Dispense: 90 tablet; Refill: 0 - TSH  3. Major depression in remission ([place])  - citalopram (CELEXA) 40 MG tablet; Take 1 tablet (40 mg total) by mouth daily.  Dispense: 90 tablet; Refill: 1  4. Essential hypertension  - losartan (COZAAR) 100 MG tablet; Take 1 tablet (100 mg total) by mouth daily.  Dispense: 90 tablet; Refill: 1 - COMPLETE METABOLIC PANEL WITH GFR  5. Stage 3a chronic kidney disease ([place])   6. Dyslipidemia  - atorvastatin (LIPITOR) 80 MG tablet; Take 1 tablet (80 mg total) by mouth every evening.  Dispense: 90 tablet; Refill: 1  7. Vitamin D deficiency   8. Need for shingles vaccine  - Zoster Vaccine Adjuvanted [facility]) injection; Inject 0.5 mLs into the muscle once for 1 dose.  Dispense: 0.5 mL; Refill: 1  9. Hives  - loratadine (CLARITIN) 10 MG tablet; Take 1 tablet (10 mg total) by mouth daily.  Dispense: 90 tablet; Refill:  1  10. Rash  - Ambulatory  referral to Dermatology - clobetasol cream (TEMOVATE) 0.05 %; Apply 1 application topically 2 (two) times daily.  Dispense: 45 g; Refill: 0  11. Fecal smearing  - Ambulatory referral to Gastroenterology .

## 2021-07-17 ENCOUNTER — Ambulatory Visit (INDEPENDENT_AMBULATORY_CARE_PROVIDER_SITE_OTHER): Payer: Medicare Other | Admitting: Family Medicine

## 2021-07-17 ENCOUNTER — Encounter: Payer: Self-pay | Admitting: Family Medicine

## 2021-07-17 VITALS — BP 132/84 | HR 90 | Resp 16 | Ht 59.0 in | Wt 147.0 lb

## 2021-07-17 DIAGNOSIS — E559 Vitamin D deficiency, unspecified: Secondary | ICD-10-CM

## 2021-07-17 DIAGNOSIS — R151 Fecal smearing: Secondary | ICD-10-CM

## 2021-07-17 DIAGNOSIS — R21 Rash and other nonspecific skin eruption: Secondary | ICD-10-CM

## 2021-07-17 DIAGNOSIS — L509 Urticaria, unspecified: Secondary | ICD-10-CM

## 2021-07-17 DIAGNOSIS — E039 Hypothyroidism, unspecified: Secondary | ICD-10-CM | POA: Diagnosis not present

## 2021-07-17 DIAGNOSIS — I10 Essential (primary) hypertension: Secondary | ICD-10-CM

## 2021-07-17 DIAGNOSIS — F325 Major depressive disorder, single episode, in full remission: Secondary | ICD-10-CM | POA: Diagnosis not present

## 2021-07-17 DIAGNOSIS — Z23 Encounter for immunization: Secondary | ICD-10-CM

## 2021-07-17 DIAGNOSIS — E1129 Type 2 diabetes mellitus with other diabetic kidney complication: Secondary | ICD-10-CM

## 2021-07-17 DIAGNOSIS — E785 Hyperlipidemia, unspecified: Secondary | ICD-10-CM

## 2021-07-17 DIAGNOSIS — R809 Proteinuria, unspecified: Secondary | ICD-10-CM

## 2021-07-17 DIAGNOSIS — N1831 Chronic kidney disease, stage 3a: Secondary | ICD-10-CM

## 2021-07-17 MED ORDER — LEVOTHYROXINE SODIUM 75 MCG PO TABS: 75.0000 ug | ORAL_TABLET | Freq: Every day | ORAL | 0 refills | Status: DC

## 2021-07-17 MED ORDER — CLOBETASOL PROPIONATE 0.05 % EX CREA: 1.0000 "application " | TOPICAL_CREAM | Freq: Two times a day (BID) | CUTANEOUS | 0 refills | Status: DC

## 2021-07-17 MED ORDER — LORATADINE 10 MG PO TABS: 10.0000 mg | ORAL_TABLET | Freq: Every day | ORAL | 1 refills | Status: DC

## 2021-07-17 MED ORDER — METFORMIN HCL ER 750 MG PO TB24: 750.0000 mg | ORAL_TABLET | Freq: Two times a day (BID) | ORAL | 1 refills | Status: DC

## 2021-07-17 MED ORDER — LOSARTAN POTASSIUM 100 MG PO TABS: 100.0000 mg | ORAL_TABLET | Freq: Every day | ORAL | 1 refills | Status: DC

## 2021-07-17 MED ORDER — ATORVASTATIN CALCIUM 80 MG PO TABS: 80.0000 mg | ORAL_TABLET | Freq: Every evening | ORAL | 1 refills | Status: DC

## 2021-07-17 MED ORDER — VITAMIN D 25 MCG (1000 UNIT) PO TABS: 1000.0000 [IU] | ORAL_TABLET | Freq: Every day | ORAL | 1 refills | Status: AC

## 2021-07-17 MED ORDER — SHINGRIX 50 MCG/0.5ML IM SUSR: 0.5000 mL | Freq: Once | INTRAMUSCULAR | 1 refills | Status: AC

## 2021-07-17 MED ORDER — CITALOPRAM HYDROBROMIDE 40 MG PO TABS: 40.0000 mg | ORAL_TABLET | Freq: Every day | ORAL | 1 refills | Status: DC

## 2021-07-17 MED ORDER — ACCU-CHEK GUIDE VI STRP: 1.0000 | ORAL_STRIP | Freq: Two times a day (BID) | 2 refills | Status: DC | PRN

## 2021-07-18 LAB — COMPLETE METABOLIC PANEL WITH GFR
AG Ratio: 1.3 (calc) (ref 1.0–2.5)
ALT: 22 U/L (ref 6–29)
AST: 42 U/L — ABNORMAL HIGH (ref 10–35)
Albumin: 4.3 g/dL (ref 3.6–5.1)
Alkaline phosphatase (APISO): 86 U/L (ref 37–153)
BUN/Creatinine Ratio: 20 (calc) (ref 6–22)
BUN: 22 mg/dL (ref 7–25)
CO2: 25 mmol/L (ref 20–32)
Calcium: 10.4 mg/dL (ref 8.6–10.4)
Chloride: 104 mmol/L (ref 98–110)
Creat: 1.08 mg/dL — ABNORMAL HIGH (ref 0.60–1.00)
Globulin: 3.2 g/dL (calc) (ref 1.9–3.7)
Glucose, Bld: 130 mg/dL — ABNORMAL HIGH (ref 65–99)
Potassium: 4.4 mmol/L (ref 3.5–5.3)
Sodium: 138 mmol/L (ref 135–146)
Total Bilirubin: 0.6 mg/dL (ref 0.2–1.2)
Total Protein: 7.5 g/dL (ref 6.1–8.1)
eGFR: 54 mL/min/{1.73_m2} — ABNORMAL LOW (ref >=60)

## 2021-07-18 LAB — HEMOGLOBIN A1C
Hgb A1c MFr Bld: 7 % of total Hgb — ABNORMAL HIGH (ref <5.7)
Mean Plasma Glucose: 154 mg/dL
eAG (mmol/L): 8.5 mmol/L

## 2021-07-18 LAB — TSH: TSH: 1.47 mIU/L (ref 0.40–4.50)

## 2021-07-20 ENCOUNTER — Other Ambulatory Visit: Payer: Self-pay

## 2021-07-20 DIAGNOSIS — F325 Major depressive disorder, single episode, in full remission: Secondary | ICD-10-CM

## 2021-07-22 ENCOUNTER — Other Ambulatory Visit: Payer: Self-pay

## 2021-07-22 ENCOUNTER — Ambulatory Visit: Payer: Medicare Other | Admitting: Dermatology

## 2021-07-22 DIAGNOSIS — L308 Other specified dermatitis: Secondary | ICD-10-CM

## 2021-07-22 DIAGNOSIS — L810 Postinflammatory hyperpigmentation: Secondary | ICD-10-CM | POA: Diagnosis not present

## 2021-07-22 DIAGNOSIS — F325 Major depressive disorder, single episode, in full remission: Secondary | ICD-10-CM

## 2021-07-22 DIAGNOSIS — R21 Rash and other nonspecific skin eruption: Secondary | ICD-10-CM

## 2021-07-22 MED ORDER — TACROLIMUS 0.1 % EX OINT
TOPICAL_OINTMENT | CUTANEOUS | 1 refills | Status: DC
Start: 2021-07-22 — End: 2021-10-07

## 2021-07-22 NOTE — Progress Notes (Signed)
° °  New Patient Visit  Subjective  Megan Short is a 76 y.o. female who presents for the following: Rash (Lower legs x 3-4 years, but larger area has come up on left pretibia in the past 3 weeks. A little itchy, areas don't go away. PCP prescribed clobetasol cream and she started using this past weekend, hasn't been able to tell a difference yet. New area on the lower neck/upper chest came up about 2 weeks ago. ). She doesn't go outside a lot and doesn't have animals. She started a new oatmeal soap, but she already had spots on the legs. She uses Gold Bond Diabetic moisturizer.  Referral from Dr Alba Cory, MD.  The following portions of the chart were reviewed this encounter and updated as appropriate:       Review of Systems:  No other skin or systemic complaints except as noted in HPI or Assessment and Plan.  Objective  Well appearing patient in no apparent distress; mood and affect are within normal limits.  A focused examination was performed including lower legs, chest. Relevant physical exam findings are noted in the Assessment and Plan.  lower legs, neck/chest Hyperpigmented violaceous papules with scattered excoriations of the bilateral lower legs and right shoulder; hyperpigmented violaceous patches, mild scale of the bilateral lower legs; hyperpigmented patches of the neck.               Assessment & Plan  Rash lower legs, neck/chest  Dermatitis with PIH, Chronic and persistent condition with duration over one year. Condition is bothersome/symptomatic for patient. Currently flared.   Continue Clobetasol Cream Apply BID to lower legs no longer than 2 weeks. After 2 weeks, decrease and use Clobetasol Cream QAM and tacrolimus ointment QHS x 2 weeks. After shower, apply moisturizer before applying Clobetasol Cream to aas only.  Start tacrolimus 0.1% ointment Apply to AAs neck BID.  Recommend mild soap and moisturizing cream 1-2 times daily.  Gentle skin care  handout provided.   Avoid scratching/rubbing areas.   tacrolimus (PROTOPIC) 0.1 % ointment - lower legs, neck/chest Apply to affected areas once to twice daily as directed.  Related Medications clobetasol cream (TEMOVATE) 0.05 % Apply 1 application topically 2 (two) times daily.   Return in about 2 weeks (around 08/05/2021).  ICherlyn Labella, CMA, am acting as scribe for Willeen Niece, MD . Documentation: I have reviewed the above documentation for accuracy and completeness, and I agree with the above.  Willeen Niece MD

## 2021-07-22 NOTE — Patient Instructions (Addendum)
Continue Clobetasol Cream (After shower, apply moisturizer before applying Clobetasol Cream.)  Apply to rash on legs twice daily x 2 weeks. After 2 weeks, decrease clobetasol cream to once daily and start tacrolimus to legs once daily.  Start tacrolimus ointment twice daily to rash on neck.   Topical steroids (such as triamcinolone, fluocinolone, fluocinonide, mometasone, clobetasol, halobetasol, betamethasone, hydrocortisone) can cause thinning and lightening of the skin if they are used for too long in the same area. Your physician has selected the right strength medicine for your problem and area affected on the body. Please use your medication only as directed by your physician to prevent side effects.   Recommend mild soap and moisturizing cream 1-2 times daily.  Gentle skin care handout provided.    Gentle Skin Care Guide  1. Bathe no more than once a day.  2. Avoid bathing in hot water  3. Use a mild soap like Dove, Vanicream, Cetaphil, CeraVe. Can use Lever 2000 or Cetaphil antibacterial soap  4. Use soap only where you need it. On most days, use it under your arms, between your legs, and on your feet. Let the water rinse other areas unless visibly dirty.  5. When you get out of the bath/shower, use a towel to gently blot your skin dry, don't rub it.  6. While your skin is still a little damp, apply a moisturizing cream such as Vanicream, CeraVe, Cetaphil, Eucerin, Sarna lotion or plain Vaseline Jelly. For hands apply Neutrogena [place] Hand Cream or Excipial Hand Cream.  7. Reapply moisturizer any time you start to itch or feel dry.  8. Sometimes using free and clear laundry detergents can be helpful. Fabric softener sheets should be avoided. Downy Free & Gentle liquid, or any liquid fabric softener that is free of dyes and perfumes, it acceptable to use  9. If your doctor has given you prescription creams you may apply moisturizers over them     If You Need Anything  After Your Visit  If you have any questions or concerns for your doctor, please call our main line at [phone] and press option 4 to reach your doctor's medical assistant. If no one answers, please leave a voicemail as directed and we will return your call as soon as possible. Messages left after 4 pm will be answered the following business day.   You may also send [place] a message via MyChart. We typically respond to MyChart messages within 1-2 business days.  For prescription refills, please ask your pharmacy to contact our office. Our fax number is [phone].  If you have an urgent issue when the clinic is closed that cannot wait until the next business day, you can page your doctor at the number below.    Please note that while we do our best to be available for urgent issues outside of office hours, we are not available 24/7.   If you have an urgent issue and are unable to reach [place], you may choose to seek medical care at your doctor's office, retail clinic, urgent care center, or emergency room.  If you have a medical emergency, please immediately call 911 or go to the emergency department.  Pager Numbers  - Dr. Gwen Pounds: [phone]  - Dr. Neale Burly: [phone]  - Dr. Roseanne Reno: [phone]  In the event of inclement weather, please call our main line at [phone] for an update on the status of any delays or closures.  Dermatology Medication Tips: Please keep the boxes that topical medications come  in in order to help keep track of the instructions about where and how to use these. Pharmacies typically print the medication instructions only on the boxes and not directly on the medication tubes.   If your medication is too expensive, please contact our office at [phone] option 4 or send [place] a message through MyChart.   We are unable to tell what your co-pay for medications will be in advance as this is different depending on your insurance coverage. However, we may be able  to find a substitute medication at lower cost or fill out paperwork to get insurance to cover a needed medication.   If a prior authorization is required to get your medication covered by your insurance company, please allow [place] 1-2 business days to complete this process.  Drug prices often vary depending on where the prescription is filled and some pharmacies may offer cheaper prices.  The website www.goodrx.com contains coupons for medications through different pharmacies. The prices here do not account for what the cost may be with help from insurance (it may be cheaper with your insurance), but the website can give you the price if you did not use any insurance.  - You can print the associated coupon and take it with your prescription to the pharmacy.  - You may also stop by our office during regular business hours and pick up a GoodRx coupon card.  - If you need your prescription sent electronically to a different pharmacy, notify our office through [facility] or by phone at [phone] option 4.     Si Usted Necesita Algo Despus de Su Visita  Tambin puede enviarnos un mensaje a travs de Clinical cytogeneticist. Por lo general respondemos a los mensajes de MyChart en el transcurso de 1 a 2 das hbiles.  Para renovar recetas, por favor pida a su farmacia que se ponga en contacto con nuestra oficina. Annie Sable de fax es [place] [phone].  Si tiene un asunto urgente cuando la clnica est cerrada y que no puede esperar hasta el siguiente da hbil, puede llamar/localizar a su doctor(a) al nmero que aparece a continuacin.   Por favor, tenga en cuenta que aunque hacemos todo lo posible para estar disponibles para asuntos urgentes fuera del horario de [place], no estamos disponibles las 24 horas del da, los 7 [address]  [address] de la [place].   Si tiene un problema urgente y no puede comunicarse con nosotros, puede optar por buscar atencin mdica  en el consultorio de su doctor(a), en una clnica  privada, en un centro de atencin urgente o en una sala de emergencias.  Si tiene Engineer, drilling, por favor llame inmediatamente al 911 o vaya a la sala de emergencias.  Nmeros de bper  - Dr. Gwen Pounds: [phone]  - Dra. Moye: [phone]  - Dra. Roseanne Reno: [phone]  En caso de inclemencias del [place], por favor llame a Lacy Duverney principal al [phone] para una actualizacin sobre el [place] de cualquier retraso o cierre.  Consejos para la medicacin en dermatologa: Por favor, guarde las cajas en las que vienen los medicamentos de uso tpico para ayudarle a seguir las instrucciones sobre dnde y cmo usarlos. Las farmacias generalmente imprimen las instrucciones del medicamento slo en las cajas y no directamente en los tubos del [place].   Si su medicamento es muy caro, por favor, pngase en contacto con Rolm [place]ala llamando al [phone] y presione la opcin 4 o envenos un mensaje a travs de Clinical cytogeneticist.   No podemos decirle cul  ser su copago por los medicamentos por adelantado ya que esto es diferente dependiendo de la cobertura de su seguro. Sin embargo, es posible que podamos encontrar un medicamento sustituto a Audiological scientist un formulario para que el seguro cubra el medicamento que se considera necesario.   Si se requiere una autorizacin previa para que su compaa de seguros [place] su medicamento, por favor permtanos de 1 a 2 das hbiles para completar [address]  Los precios de los medicamentos varan con frecuencia dependiendo del Environmental consultant de dnde se surte la receta y alguna farmacias pueden ofrecer precios ms baratos.  El sitio web www.goodrx.com tiene cupones para medicamentos de Health and safety inspector. Los precios aqu no tienen en cuenta lo que podra costar con la ayuda del seguro (puede ser ms barato con su seguro), pero el sitio web puede darle el precio si no utiliz Tourist information centre manager.  - Puede imprimir el cupn correspondiente y  llevarlo con su receta a la farmacia.  - Tambin puede pasar por nuestra oficina durante el horario de atencin regular y Education officer, museum una tarjeta de cupones de GoodRx.  - Si necesita que su receta se enve electrnicamente a una farmacia diferente, informe a nuestra oficina a travs de MyChart de [place] o por telfono llamando al [phone] y presione la opcin 4.

## 2021-07-27 ENCOUNTER — Other Ambulatory Visit: Payer: Self-pay

## 2021-07-27 ENCOUNTER — Encounter: Payer: Self-pay | Admitting: Family Medicine

## 2021-07-27 ENCOUNTER — Other Ambulatory Visit: Payer: Self-pay | Admitting: Family Medicine

## 2021-07-27 DIAGNOSIS — F325 Major depressive disorder, single episode, in full remission: Secondary | ICD-10-CM

## 2021-07-27 MED ORDER — CLONAZEPAM 0.5 MG PO TABS: 0.5000 mg | ORAL_TABLET | Freq: Two times a day (BID) | ORAL | 0 refills | Status: DC | PRN

## 2021-07-30 LAB — HM DIABETES EYE EXAM

## 2021-08-05 ENCOUNTER — Ambulatory Visit: Payer: Medicare Other | Admitting: Dermatology

## 2021-08-05 ENCOUNTER — Encounter: Payer: Self-pay | Admitting: Dermatology

## 2021-08-05 ENCOUNTER — Other Ambulatory Visit: Payer: Self-pay

## 2021-08-05 DIAGNOSIS — L308 Other specified dermatitis: Secondary | ICD-10-CM

## 2021-08-05 DIAGNOSIS — L810 Postinflammatory hyperpigmentation: Secondary | ICD-10-CM | POA: Diagnosis not present

## 2021-08-05 NOTE — Progress Notes (Signed)
? ?  Follow-Up Visit ?  ?Subjective  ?Megan Short is a 76 y.o. female who presents for the following: Rash (2 week eczema recheck. Neck and legs. Using Clobetasol and Tacrolimus 0.1% ointment as directed. Patient states rash looks better. Has letter from insurance company regarding Tacrolimus).  Can only get it once, will need a PA before she can get any more.  Rash not itchy anymore. ? ? ? ?The following portions of the chart were reviewed this encounter and updated as appropriate:   ?  ? ?Review of Systems: No other skin or systemic complaints except as noted in HPI or Assessment and Plan. ? ? ?Objective  ?Well appearing patient in no apparent distress; mood and affect are within normal limits. ? ?A focused examination was performed including face, neck, legs. Relevant physical exam findings are noted in the Assessment and Plan. ? ?legs ?Hyperpigmented macules and patches with some scarring secondary to scratching at legs. Light hyperpigmented patch, with fading, on anterior neck and chest. ? ? ?Assessment & Plan  ?Other eczema ?legs ? ?With PIH ?Chronic and persistent condition with duration or expected duration over one year. Condition is improving but not to goal.  ? ?Use Clobetasol once daily for 2 more weeks to aas on lower legs then discontinue. Can restart twice daily up to 2 weeks as needed for flare ups or new itchy areas. Caution skin atrophy with long-term use.  ?Topical steroids (such as triamcinolone, fluocinolone, fluocinonide, mometasone, clobetasol, halobetasol, betamethasone, hydrocortisone) can cause thinning and lightening of the skin if they are used for too long in the same area. Your physician has selected the right strength medicine for your problem and area affected on the body. Please use your medication only as directed by your physician to prevent side effects.   ? ?Use Tacrolimus 0.1% ointment once daily to aas leg and neck x 2 weeks, then as needed. ? ?May add a fade cream on f/up if  needed as long as rash is clear. ? ?Continue mild soap and daily moisturizer to legs. ? ?Insurance does not cover Tacrolimus until patient has failed 2 preferred medications. Will send in [facility] 2.5% cream when patient runs out of Tacrolimus if needed.  ? ?Consider patch testing if continues to recur.  ? ? ?Return for Rash Follow Up 4-6 weeks. ? ?I, Lawson Radar, CMA, am acting as scribe for Willeen Niece, MD. ? ?Documentation: I have reviewed the above documentation for accuracy and completeness, and I agree with the above. ? ?Willeen Niece MD  ? ?

## 2021-08-05 NOTE — Patient Instructions (Addendum)
Use Clobetasol once daily for 2 more weeks. Can resume twice daily up to 2 weeks for flare ups or new areas. Avoid applying to face, groin, and axilla. Use as directed. Long-term use can cause thinning of the skin.   Use Tacrolimus once daily.  Topical steroids (such as triamcinolone, fluocinolone, fluocinonide, mometasone, clobetasol, halobetasol, betamethasone, hydrocortisone) can cause thinning and lightening of the skin if they are used for too long in the same area. Your physician has selected the right strength medicine for your problem and area affected on the body. Please use your medication only as directed by your physician to prevent side effects.      Gentle Skin Care Guide  1. Bathe no more than once a day.  2. Avoid bathing in hot water  3. Use a mild soap like Dove, Vanicream, Cetaphil, CeraVe. Can use Lever 2000 or Cetaphil antibacterial soap  4. Use soap only where you need it. On most days, use it under your arms, between your legs, and on your feet. Let the water rinse other areas unless visibly dirty.  5. When you get out of the bath/shower, use a towel to gently blot your skin dry, don't rub it.  6. While your skin is still a little damp, apply a moisturizing cream such as Vanicream, CeraVe, Cetaphil, Eucerin, Sarna lotion or plain Vaseline Jelly. For hands apply Neutrogena [place] Hand Cream or Excipial Hand Cream.  7. Reapply moisturizer any time you start to itch or feel dry.  8. Sometimes using free and clear laundry detergents can be helpful. Fabric softener sheets should be avoided. Downy Free & Gentle liquid, or any liquid fabric softener that is free of dyes and perfumes, it acceptable to use  9. If your doctor has given you prescription creams you may apply moisturizers over them    If You Need Anything After Your Visit  If you have any questions or concerns for your doctor, please call our main line at [phone] and press option 4 to reach your  doctor's medical assistant. If no one answers, please leave a voicemail as directed and we will return your call as soon as possible. Messages left after 4 pm will be answered the following business day.   You may also send [place] a message via [place]. We typically respond to MyChart messages within 1-2 business days.  For prescription refills, please ask your pharmacy to contact our office. Our fax number is [phone].  If you have an urgent issue when the clinic is closed that cannot wait until the next business day, you can page your doctor at the number below.    Please note that while we do our best to be available for urgent issues outside of office hours, we are not available 24/7.   If you have an urgent issue and are unable to reach [place], you may choose to seek medical care at your doctor's office, retail clinic, urgent care center, or emergency room.  If you have a medical emergency, please immediately call 911 or go to the emergency department.  Pager Numbers  - Dr. Nehemiah Massed: [phone]  - Dr. Laurence Ferrari: [phone]  - Dr. Nicole Kindred: [phone]  In the event of inclement weather, please call our main line at [phone] for an update on the status of any delays or closures.  Dermatology Medication Tips: Please keep the boxes that topical medications come in in order to help keep track of the instructions about where and how to use these. Pharmacies typically  print the medication instructions only on the boxes and not directly on the medication tubes.   If your medication is too expensive, please contact our office at [phone] option 4 or send [place] a message through [place].   We are unable to tell what your co-pay for medications will be in advance as this is different depending on your insurance coverage. However, we may be able to find a substitute medication at lower cost or fill out paperwork to get insurance to cover a needed medication.   If a prior authorization is  required to get your medication covered by your insurance company, please allow [place] 1-2 business days to complete this process.  Drug prices often vary depending on where the prescription is filled and some pharmacies may offer cheaper prices.  The website www.goodrx.com contains coupons for medications through different pharmacies. The prices here do not account for what the cost may be with help from insurance (it may be cheaper with your insurance), but the website can give you the price if you did not use any insurance.  - You can print the associated coupon and take it with your prescription to the pharmacy.  - You may also stop by our office during regular business hours and pick up a GoodRx coupon card.  - If you need your prescription sent electronically to a different pharmacy, notify our office through [facility] or by phone at [phone] option 4.     Si Usted Necesita Algo Despus de Su Visita  Tambin puede enviarnos un mensaje a travs de Pharmacist, community. Por lo general respondemos a los mensajes de MyChart en el transcurso de 1 a 2 das hbiles.  Para renovar recetas, por favor pida a su farmacia que se ponga en contacto con nuestra oficina. Harland Dingwall de fax es [place] [phone].  Si tiene un asunto urgente cuando la clnica est cerrada y que no puede esperar hasta el siguiente da hbil, puede llamar/localizar a su doctor(a) al nmero que aparece a continuacin.   Por favor, tenga en cuenta que aunque hacemos todo lo posible para estar disponibles para asuntos urgentes fuera del horario de [place], no estamos disponibles las 24 horas del da, los 7 das de la [place].   Si tiene un problema urgente y no puede comunicarse con nosotros, puede optar por buscar atencin mdica  en el consultorio de su doctor(a), en una clnica privada, en un centro de atencin urgente o en una sala de emergencias.  Si tiene Engineering geologist, por favor llame inmediatamente al 911 o vaya a  la sala de emergencias.  Nmeros de bper  - Dr. Nehemiah Massed: [phone]  - Dra. Moye: [phone]  - Dra. Nicole Kindred: [phone]  En caso de inclemencias del [place], por favor llame a Johnsie Kindred principal al [phone] para una actualizacin sobre el [place] de cualquier retraso o cierre.  Consejos para la medicacin en dermatologa: Por favor, guarde las cajas en las que vienen los medicamentos de uso tpico para ayudarle a seguir las instrucciones sobre dnde y cmo usarlos. Las farmacias generalmente imprimen las instrucciones del medicamento slo en las cajas y no directamente en los tubos del [place].   Si su medicamento es muy caro, por favor, pngase en contacto con Zigmund Daniel llamando al [phone] y presione la opcin 4 o envenos un mensaje a travs de Pharmacist, community.   No podemos decirle cul ser su copago por los medicamentos por adelantado ya que esto es diferente dependiendo de la [place]  seguro. Sin embargo, es posible que podamos encontrar un medicamento sustituto a Electrical engineer un formulario para que el seguro cubra el medicamento que se considera necesario.   Si se requiere una autorizacin previa para que su compaa de seguros [place] su medicamento, por favor permtanos de 1 a 2 das hbiles para completar este proceso.  Los precios de los medicamentos varan con frecuencia dependiendo del Environmental consultant de dnde se surte la receta y alguna farmacias pueden ofrecer precios ms baratos.  El sitio web www.goodrx.com tiene cupones para medicamentos de Airline pilot. Los precios aqu no tienen en cuenta lo que podra costar con la ayuda del seguro (puede ser ms barato con su seguro), pero el sitio web puede darle el precio si no utiliz Research scientist (physical sciences).  - Puede imprimir el cupn correspondiente y llevarlo con su receta a la farmacia.  - Tambin puede pasar por nuestra oficina durante el horario de atencin regular y Charity fundraiser una tarjeta de cupones de  GoodRx.  - Si necesita que su receta se enve electrnicamente a una farmacia diferente, informe a nuestra oficina a travs de MyChart de [place] o por telfono llamando al [phone] y presione la opcin 4.

## 2021-09-02 ENCOUNTER — Other Ambulatory Visit: Payer: Self-pay | Admitting: Family Medicine

## 2021-09-02 DIAGNOSIS — E039 Hypothyroidism, unspecified: Secondary | ICD-10-CM

## 2021-09-08 ENCOUNTER — Ambulatory Visit: Payer: Medicare Other | Admitting: Dermatology

## 2021-09-08 DIAGNOSIS — L309 Dermatitis, unspecified: Secondary | ICD-10-CM

## 2021-09-08 DIAGNOSIS — L810 Postinflammatory hyperpigmentation: Secondary | ICD-10-CM

## 2021-09-08 DIAGNOSIS — M67441 Ganglion, right hand: Secondary | ICD-10-CM | POA: Diagnosis not present

## 2021-09-08 MED ORDER — HYDROQUINONE 4 % EX CREA: TOPICAL_CREAM | Freq: Two times a day (BID) | CUTANEOUS | 2 refills | Status: DC

## 2021-09-08 NOTE — Patient Instructions (Addendum)
Apply hydroquinone 4 % cream before applying tacrolimus ointment ? ?Continue tacrolimus ointment -  can use daily as a spot treatment to any pink itchy spots at legs or neck.  ? ?Digital Mucous Cyst at right index finger - if bothersome recommend hand surgeon - benign growth ? ? ?Recommend daily broad spectrum sunscreen SPF 30+ to sun-exposed areas, reapply every 2 hours as needed. Call for new or changing lesions.  ?Staying in the shade or wearing long sleeves, sun glasses (UVA+UVB protection) and wide brim hats (4-inch brim around the entire circumference of the hat) are also recommended for sun protection.  ? ?If You Need Anything After Your Visit ? ?If you have any questions or concerns for your doctor, please call our main line at [phone] and press option 4 to reach your doctor's medical assistant. If no one answers, please leave a voicemail as directed and we will return your call as soon as possible. Messages left after 4 pm will be answered the following business day.  ? ?You may also send [place] a message via MyChart. We typically respond to MyChart messages within 1-2 business days. ? ?For prescription refills, please ask your pharmacy to contact our office. Our fax number is [phone]. ? ?If you have an urgent issue when the clinic is closed that cannot wait until the next business day, you can page your doctor at the number below.   ? ?Please note that while we do our best to be available for urgent issues outside of office hours, we are not available 24/7.  ? ?If you have an urgent issue and are unable to reach [place], you may choose to seek medical care at your doctor's office, retail clinic, urgent care center, or emergency room. ? ?If you have a medical emergency, please immediately call 911 or go to the emergency department. ? ?Pager Numbers ? ?- Dr. Gwen Pounds: [phone] ? ?- Dr. Neale Burly: [phone] ? ?- Dr. Roseanne Reno: [phone] ? ?In the event of inclement weather, please call our main line at  [phone] for an update on the status of any delays or closures. ? ?Dermatology Medication Tips: ?Please keep the boxes that topical medications come in in order to help keep track of the instructions about where and how to use these. Pharmacies typically print the medication instructions only on the boxes and not directly on the medication tubes.  ? ?If your medication is too expensive, please contact our office at [phone] option 4 or send [place] a message through MyChart.  ? ?We are unable to tell what your co-pay for medications will be in advance as this is different depending on your insurance coverage. However, we may be able to find a substitute medication at lower cost or fill out paperwork to get insurance to cover a needed medication.  ? ?If a prior authorization is required to get your medication covered by your insurance company, please allow [place] 1-2 business days to complete this process. ? ?Drug prices often vary depending on where the prescription is filled and some pharmacies may offer cheaper prices. ? ?The website www.goodrx.com contains coupons for medications through different pharmacies. The prices here do not account for what the cost may be with help from insurance (it may be cheaper with your insurance), but the website can give you the price if you did not use any insurance.  ?- You can print the associated coupon and take it with your prescription to the pharmacy.  ?- You may also stop by our  office during regular business hours and pick up a GoodRx coupon card.  ?- If you need your prescription sent electronically to a different pharmacy, notify our office through [facility] or by phone at [phone] option 4. ? ? ? ? ?Si Usted Necesita Algo Despu?s de Su Visita ? ?Tambi?n puede enviarnos un mensaje a trav?s de MyChart. Por lo general respondemos a los mensajes de MyChart en el transcurso de 1 a 2 d?as h?biles. ? ?Para renovar recetas, por favor pida a su farmacia que se  ponga en contacto con nuestra oficina. Nuestro n?mero de fax es el [phone]. ? ?Si tiene un asunto urgente cuando la cl?nica est? cerrada y que no puede esperar hasta el siguiente d?a h?bil, puede llamar/localizar a su doctor(a) al n?mero que aparece a continuaci?n.  ? ?Por favor, tenga en cuenta que aunque hacemos todo lo posible para estar disponibles para asuntos urgentes fuera del horario de oficina, no estamos disponibles las 24 horas del d?a, los 7 d?as de la semana.  ? ?Si tiene un problema urgente y no puede comunicarse con nosotros, puede optar por buscar atenci?n m?dica  en el consultorio de su doctor(a), en una cl?nica privada, en un centro de atenci?n urgente o en una sala de emergencias. ? ?Si tiene Radio broadcast assistant m?dica, por favor llame inmediatamente al 911 o vaya a la sala de emergencias. ? ?N?meros de b?per ? ?- Dr. Gwen Pounds: [phone] ? ?- Dra. Moye: [phone] ? ?- Dra. Roseanne Reno: [phone] ? ?En caso de inclemencias del tiempo, por favor llame a nuestra l?nea principal al [phone] para una actualizaci?n sobre el estado de cualquier retraso o cierre. ? ?Consejos para la medicaci?n en dermatolog?a: ?Por favor, guarde las cajas en las que vienen los medicamentos de uso t?pico para ayudarle a seguir las instrucciones sobre d?nde y c?mo usarlos. Las farmacias generalmente imprimen las instrucciones del medicamento s?lo en las cajas y no directamente en los tubos del [place].  ? ?Si su medicamento es muy caro, por favor, p?ngase en contacto con Rolm Gala llamando al [phone] y presione la opci?n 4 o env?enos un mensaje a trav?s de MyChart.  ? ?No podemos decirle cu?l ser? su copago por los medicamentos por adelantado ya que esto es diferente dependiendo de la co[place] de su seguro. Sin embargo, es posible que podamos encontrar un medicamento sustituto a Audiological scientist un formulario para que el seguro cubra el medicamento que se considera necesario.  ? ?Si se requiere  Neomia Dear autorizaci?n previa para que su compa??a de seguros [place] su medicamento, por favor perm?tanos de 1 a 2 d?as h?biles para completar este proceso. ? ?Los precios de los medicamentos var?an con frecuencia dependiendo del Environmental consultant de d?nde se surte la receta y alguna farmacias pueden ofrecer precios m?s baratos. ? ?El sitio web www.goodrx.com tiene cupones para medicamentos de Health and safety inspector. Los precios aqu? no tienen en cuenta lo que podr?a costar con la ayuda del seguro (puede ser m?s barato con su seguro), pero el sitio web puede darle el precio si no utiliz? ning?n seguro.  ?- Puede imprimir el cup?n correspondiente y llevarlo con su receta a la farmacia.  ?- Tambi?n puede pasar por nuestra oficina durante el horario de atenci?n regular y recoger una tarjeta de cupones de GoodRx.  ?- Si necesita que su receta se env?e electr?nicamente a Psychiatrist, informe a nuestra oficina a trav?s de MyChart de [place] o por tel?fono llamando al [phone] y presione la opci?n 4. ? ?

## 2021-09-08 NOTE — Progress Notes (Signed)
? ?  Follow-Up Visit ?  ?Subjective  ?Megan Short is a 76 y.o. female who presents for the following: Follow-up (Patient here today for eczema follow up at legs and neck. Patient finished clobetasol and is still using tacrolimus ointment to affected areas. She reports no new spots but prior areas are taking a while to fade at legs. /Patient also reports a bump/spot at side of right index finger she would like checked. ). ?Denies arthritis in hands ? ? ?The following portions of the chart were reviewed this encounter and updated as appropriate:   ?  ? ?Review of Systems: No other skin or systemic complaints except as noted in HPI or Assessment and Plan. ? ? ?Objective  ?Well appearing patient in no apparent distress; mood and affect are within normal limits. ? ?A focused examination was performed including bilateral legs, . Relevant physical exam findings are noted in the Assessment and Plan. ? ?bilateral legs ?Much improved with residual hyperpigmentation and mild erythema at legs, mild pigmentation at neck ? ? ? ? ? ? ? ? ? ?right index finger at pip joint ?0.6 mm firm flesh nodule at joint  ? ? ?Assessment & Plan  ?Eczema, unspecified type ?bilateral legs ? ?Improved, with residual post-inflammatory hyperpigmentation ? ?Continue using tacrolimus ointment use as a spot treatment to pink areas and use once daily ? ?Will gradually fade over time ?Start Hydroquinone 4 % cream - apply topically 1 - 2 times daily to help fade dark spots at legs, Apply before applying tacrolimus ointment  ? ?Recommend daily broad spectrum sunscreen SPF 30+ to sun-exposed areas, reapply every 2 hours as needed. ? ?Atopic dermatitis (eczema) is a chronic, relapsing, pruritic condition that can significantly affect quality of life. It is often associated with allergic rhinitis and/or asthma and can require treatment with topical medications, phototherapy, or in severe cases biologic injectable medication (Dupixent; Adbry) or Oral JAK  inhibitors. ? ? ? ?hydroquinone 4 % cream - bilateral legs ?Apply topically 2 (two) times daily. To help fade dark spots at legs ? ?Digital mucous cyst of finger of right hand ?right index finger at pip joint ? ?Benign, observe. ? ?Discussed if bothersome/symptomatic would need to see hand surgeon to remove ? ? ?Return in about 3 months (around 12/08/2021) for eczema/PIH. ?I, Asher Muir, CMA, am acting as scribe for Willeen Niece, MD. ? ?Documentation: I have reviewed the above documentation for accuracy and completeness, and I agree with the above. ? ?Willeen Niece MD  ?

## 2021-09-09 ENCOUNTER — Telehealth: Payer: 59

## 2021-09-11 ENCOUNTER — Other Ambulatory Visit: Payer: Self-pay | Admitting: Family Medicine

## 2021-09-11 DIAGNOSIS — E785 Hyperlipidemia, unspecified: Secondary | ICD-10-CM

## 2021-09-11 DIAGNOSIS — E1129 Type 2 diabetes mellitus with other diabetic kidney complication: Secondary | ICD-10-CM

## 2021-09-11 DIAGNOSIS — I10 Essential (primary) hypertension: Secondary | ICD-10-CM

## 2021-09-14 ENCOUNTER — Other Ambulatory Visit: Payer: Self-pay

## 2021-09-14 DIAGNOSIS — E1129 Type 2 diabetes mellitus with other diabetic kidney complication: Secondary | ICD-10-CM

## 2021-10-06 ENCOUNTER — Telehealth: Payer: Self-pay

## 2021-10-06 NOTE — Telephone Encounter (Signed)
Pt LM on out VM requesting refills of ointments, new eczema patch on the left ankle. ? ?LM on VM returning pts call  ?

## 2021-10-07 ENCOUNTER — Other Ambulatory Visit: Payer: Self-pay

## 2021-10-07 DIAGNOSIS — R21 Rash and other nonspecific skin eruption: Secondary | ICD-10-CM

## 2021-10-07 MED ORDER — TACROLIMUS 0.1 % EX OINT: TOPICAL_OINTMENT | CUTANEOUS | 1 refills | Status: DC

## 2021-10-07 NOTE — Telephone Encounter (Signed)
Patient returned call. Called home this AM but spouse advised patient not home at the time. aw ?

## 2021-10-28 ENCOUNTER — Other Ambulatory Visit: Payer: Self-pay | Admitting: Family Medicine

## 2021-10-28 DIAGNOSIS — E1129 Type 2 diabetes mellitus with other diabetic kidney complication: Secondary | ICD-10-CM

## 2021-11-09 ENCOUNTER — Other Ambulatory Visit: Payer: Self-pay | Admitting: Family Medicine

## 2021-11-09 DIAGNOSIS — Z1231 Encounter for screening mammogram for malignant neoplasm of breast: Secondary | ICD-10-CM

## 2021-11-26 ENCOUNTER — Other Ambulatory Visit: Payer: Self-pay | Admitting: Family Medicine

## 2021-11-26 DIAGNOSIS — I10 Essential (primary) hypertension: Secondary | ICD-10-CM

## 2021-11-26 DIAGNOSIS — E1129 Type 2 diabetes mellitus with other diabetic kidney complication: Secondary | ICD-10-CM

## 2021-11-26 DIAGNOSIS — E785 Hyperlipidemia, unspecified: Secondary | ICD-10-CM

## 2021-11-26 DIAGNOSIS — F325 Major depressive disorder, single episode, in full remission: Secondary | ICD-10-CM

## 2021-12-06 ENCOUNTER — Other Ambulatory Visit: Payer: Self-pay | Admitting: Family Medicine

## 2021-12-06 DIAGNOSIS — E039 Hypothyroidism, unspecified: Secondary | ICD-10-CM

## 2021-12-07 ENCOUNTER — Other Ambulatory Visit: Payer: Self-pay | Admitting: Family Medicine

## 2021-12-07 MED ORDER — EMPAGLIFLOZIN 25 MG PO TABS: 25.0000 mg | ORAL_TABLET | Freq: Every day | ORAL | 1 refills | Status: DC

## 2021-12-08 ENCOUNTER — Other Ambulatory Visit: Payer: Self-pay | Admitting: Dermatology

## 2021-12-08 DIAGNOSIS — L309 Dermatitis, unspecified: Secondary | ICD-10-CM

## 2021-12-09 ENCOUNTER — Ambulatory Visit: Payer: Self-pay | Admitting: *Deleted

## 2021-12-09 ENCOUNTER — Other Ambulatory Visit: Payer: Self-pay | Admitting: Family Medicine

## 2021-12-09 NOTE — Telephone Encounter (Signed)
Patient aware she doesn't need to pick up and gave verbal understanding.

## 2021-12-09 NOTE — Telephone Encounter (Addendum)
Summary: discuss medication   Patient inquiring why provider prescribed empagliflozin (JARDIANCE) 25 MG TABS tablet   Patient states she was not aware of rx being prescribe      Do not see a note about why it was ordered, routed to office.

## 2021-12-16 ENCOUNTER — Ambulatory Visit
Admission: RE | Admit: 2021-12-16 | Discharge: 2021-12-16 | Disposition: A | Discharge status: Home / Self Care | Payer: Medicare Other | Source: Ambulatory Visit | Attending: Family Medicine | Admitting: Family Medicine

## 2021-12-16 DIAGNOSIS — Z1231 Encounter for screening mammogram for malignant neoplasm of breast: Secondary | ICD-10-CM | POA: Insufficient documentation

## 2021-12-29 ENCOUNTER — Ambulatory Visit: Payer: Medicare Other | Admitting: Dermatology

## 2021-12-29 DIAGNOSIS — M79671 Pain in right foot: Secondary | ICD-10-CM | POA: Diagnosis not present

## 2021-12-29 DIAGNOSIS — R21 Rash and other nonspecific skin eruption: Secondary | ICD-10-CM

## 2021-12-29 DIAGNOSIS — L810 Postinflammatory hyperpigmentation: Secondary | ICD-10-CM | POA: Diagnosis not present

## 2021-12-29 DIAGNOSIS — L309 Dermatitis, unspecified: Secondary | ICD-10-CM | POA: Diagnosis not present

## 2021-12-29 MED ORDER — TACROLIMUS 0.1 % EX OINT: TOPICAL_OINTMENT | CUTANEOUS | 1 refills | Status: AC

## 2021-12-29 MED ORDER — HYDROQUINONE 4 % EX CREA: TOPICAL_CREAM | CUTANEOUS | 0 refills | Status: DC

## 2021-12-29 NOTE — Progress Notes (Signed)
   Follow-Up Visit   Subjective  Megan Short is a 76 y.o. female who presents for the following: Eczema (3 mth follow up, new spots at right lower leg, ) and Skin Problem (Reports a burning at top of right foot that she has been experiencing for the past month. Patient reports is diabetic. ).  The patient has spots, moles and lesions to be evaluated, some may be new or changing and the patient has concerns that these could be cancer.   The following portions of the chart were reviewed this encounter and updated as appropriate:      Review of Systems: No other skin or systemic complaints except as noted in HPI or Assessment and Plan.   Objective  Well appearing patient in no apparent distress; mood and affect are within normal limits.  A focused examination was performed including b/l extremities and right foot . Relevant physical exam findings are noted in the Assessment and Plan.  bilateral legs Hyperpigmented papules with slight central hypopigmentation at right calf   Scattered light brown patches with fading on bilateral lower legs            right foot Skin is clear   Assessment & Plan  Eczema, unspecified type bilateral legs  With post-inflammatory hyperpigmentation, Chronic and persistent condition with duration or expected duration over one year. Condition is symptomatic/ bothersome to patient. Not currently at goal.  Atopic dermatitis (eczema) is a chronic, relapsing, pruritic condition that can significantly affect quality of life. It is often associated with allergic rhinitis and/or asthma and can require treatment with topical medications, phototherapy, or in severe cases biologic injectable medication (Dupixent; Adbry) or Oral JAK inhibitors.  Continue using tacrolimus 0.1% ointment use as a spot treatment to pink areas and use 1-2x daily  If new spots form recommend calling and we could consider bx to confirm diagnosis, since I have not yet seen new  active lesions.   As alternative to hydroquinone 4 % cream if too expensive, Recommend OTC Inkey Tranexamic Acid cream to apply to hyperpigmented areas on legs at night.  Can buy at [place], [employer] [employer], or online for around $15.  Can continue using mild soap and moisturizer    hydroquinone 4 % cream - bilateral legs APPLY TOPICALLY TO DARK SPOTS ON LEGS TWICE DAILY TO HELP FADE  Rash  Related Medications clobetasol cream (TEMOVATE) 0.05 % Apply 1 application topically 2 (two) times daily.  tacrolimus (PROTOPIC) 0.1 % ointment Apply to affected areas once to twice daily as directed.  Right foot pain right foot  Discussed burning and tingling could be coming from Diabetic Neuropathy.  Recommend follow up with pcp for further evaluation   Return if symptoms worsen or fail to improve, for possible biopsy. I, Asher Muir, CMA, am acting as scribe for Willeen Niece, MD.  Documentation: I have reviewed the above documentation for accuracy and completeness, and I agree with the above.  Willeen Niece MD

## 2021-12-29 NOTE — Patient Instructions (Addendum)
Continue tacrolimus ointment as directed  Call if any new spots come up and can do biopsy - can call or send mychart message   For dark spots at legs Recommend OTC Inkey Tranexamic Acid cream to apply to hyperpigmented areas on legs at night.  Can buy at [place], [employer] [employer], or online for around $15.   Recommend daily broad spectrum sunscreen SPF 30+ to sun-exposed areas, reapply every 2 hours as needed. Call for new or changing lesions.  Staying in the shade or wearing long sleeves, sun glasses (UVA+UVB protection) and wide brim hats (4-inch brim around the entire circumference of the hat) are also recommended for sun protection.     Due to recent changes in healthcare laws, you may see results of your pathology and/or laboratory studies on MyChart before the doctors have had a chance to review them. We understand that in some cases there may be results that are confusing or concerning to you. Please understand that not all results are received at the same time and often the doctors may need to interpret multiple results in order to provide you with the best plan of care or course of treatment. Therefore, we ask that you please give [place] 2 business days to thoroughly review all your results before contacting the office for clarification. Should we see a critical lab result, you will be contacted sooner.   If You Need Anything After Your Visit  If you have any questions or concerns for your doctor, please call our main line at [phone] and press option 4 to reach your doctor's medical assistant. If no one answers, please leave a voicemail as directed and we will return your call as soon as possible. Messages left after 4 pm will be answered the following business day.   You may also send [place] a message via MyChart. We typically respond to MyChart messages within 1-2 business days.  For prescription refills, please ask your pharmacy to contact our office. Our fax number is [phone].  If you  have an urgent issue when the clinic is closed that cannot wait until the next business day, you can page your doctor at the number below.    Please note that while we do our best to be available for urgent issues outside of office hours, we are not available 24/7.   If you have an urgent issue and are unable to reach [place], you may choose to seek medical care at your doctor's office, retail clinic, urgent care center, or emergency room.  If you have a medical emergency, please immediately call 911 or go to the emergency department.  Pager Numbers  - Dr. Gwen Pounds: [phone]  - Dr. Neale Burly: [phone]  - Dr. Roseanne Reno: [phone]  In the event of inclement weather, please call our main line at [phone] for an update on the status of any delays or closures.  Dermatology Medication Tips: Please keep the boxes that topical medications come in in order to help keep track of the instructions about where and how to use these. Pharmacies typically print the medication instructions only on the boxes and not directly on the medication tubes.   If your medication is too expensive, please contact our office at [phone] option 4 or send [place] a message through MyChart.   We are unable to tell what your co-pay for medications will be in advance as this is different depending on your insurance coverage. However, we may be able to find a substitute medication at lower cost or fill  out paperwork to get insurance to cover a needed medication.   If a prior authorization is required to get your medication covered by your insurance company, please allow [place] 1-2 business days to complete this process.  Drug prices often vary depending on where the prescription is filled and some pharmacies may offer cheaper prices.  The website www.goodrx.com contains coupons for medications through different pharmacies. The prices here do not account for what the cost may be with help from insurance (it may be cheaper  with your insurance), but the website can give you the price if you did not use any insurance.  - You can print the associated coupon and take it with your prescription to the pharmacy.  - You may also stop by our office during regular business hours and pick up a GoodRx coupon card.  - If you need your prescription sent electronically to a different pharmacy, notify our office through [facility] or by phone at [phone] option 4.     Si Usted Necesita Algo Despus de Su Visita  Tambin puede enviarnos un mensaje a travs de Pharmacist, community. Por lo general respondemos a los mensajes de MyChart en el transcurso de 1 a 2 das hbiles.  Para renovar recetas, por favor pida a su farmacia que se ponga en contacto con nuestra oficina. Harland Dingwall de fax es [place] [phone].  Si tiene un asunto urgente cuando la clnica est cerrada y que no puede esperar hasta el siguiente da hbil, puede llamar/localizar a su doctor(a) al nmero que aparece a continuacin.   Por favor, tenga en cuenta que aunque hacemos todo lo posible para estar disponibles para asuntos urgentes fuera del horario de [place], no estamos disponibles las 24 horas del da, los 7 das de la [place].   Si tiene un problema urgente y no puede comunicarse con nosotros, puede optar por buscar atencin mdica  en el consultorio de su doctor(a), en una clnica privada, en un centro de atencin urgente o en una sala de emergencias.  Si tiene Engineering geologist, por favor llame inmediatamente al 911 o vaya a la sala de emergencias.  Nmeros de bper  - Dr. Nehemiah Massed: [phone]  - Dra. Moye: [phone]  - Dra. Nicole Kindred: [phone]  En caso de inclemencias del [place], por favor llame a Johnsie Kindred principal al [phone] para una actualizacin sobre el [place] de cualquier retraso o cierre.  Consejos para la medicacin en dermatologa: Por favor, guarde las cajas en las que vienen los medicamentos de uso tpico para  ayudarle a seguir las instrucciones sobre dnde y cmo usarlos. Las farmacias generalmente imprimen las instrucciones del medicamento slo en las cajas y no directamente en los tubos del [place].   Si su medicamento es muy caro, por favor, pngase en contacto con Zigmund Daniel llamando al [phone] y presione la opcin 4 o envenos un mensaje a travs de Pharmacist, community.   No podemos decirle cul ser su copago por los medicamentos por adelantado ya que esto es diferente dependiendo de la cobertura de su seguro. Sin embargo, es posible que podamos encontrar un medicamento sustituto a Electrical engineer un formulario para que el seguro cubra el medicamento que se considera necesario.   Si se requiere una autorizacin previa para que su compaa de seguros [place] su medicamento, por favor permtanos de 1 a 2 das hbiles para completar este proceso.  Los precios de los medicamentos varan con frecuencia dependiendo del Environmental consultant de dnde se surte la receta y  alguna farmacias pueden ofrecer precios ms baratos.  El sitio web www.goodrx.com tiene cupones para medicamentos de Airline pilot. Los precios aqu no tienen en cuenta lo que podra costar con la ayuda del seguro (puede ser ms barato con su seguro), pero el sitio web puede darle el precio si no utiliz Research scientist (physical sciences).  - Puede imprimir el cupn correspondiente y llevarlo con su receta a la farmacia.  - Tambin puede pasar por nuestra oficina durante el horario de atencin regular y Charity fundraiser una tarjeta de cupones de GoodRx.  - Si necesita que su receta se enve electrnicamente a una farmacia diferente, informe a nuestra oficina a travs de MyChart de [place] o por telfono llamando al [phone] y presione la opcin 4.

## 2022-01-13 NOTE — Progress Notes (Unsigned)
Name: Megan Short   MRN: [id number]    DOB: [birth year]   Date:01/13/2022       Progress Note  Subjective  Chief Complaint  Follow Up  HPI  HTN: taking medication - Losartan  daily  and denies side effects. No chest pain, no palpitation or dizziness.  BP is at goal today   Rash she has a long history of rash on legs, but getting worse, very itchy and now has a dry patch on right anterior chest wall.    Hypothyroidism: denies fatigue, hair loss, but she has dry skin, no constipation She has states only occasionally chokes when eating fast, no dysphagia. She is taking Levothyroxine as prescribed last TSH at goal, we will recheck labs today   Bowel incontinence: she states it has happened twice in the past 4 months,  she didn't even feel it until she sat down on the commode and it was dirty , she checked and underwear  was dirty also, smeared in stools . She denies no balance problems, no tingling or numbness. No blood in stools.    DMII:  She is exercising again, eating smaller portions, she is on higher dose of Metformin at 1500 mg  A1C has gone up from 6.5 % to 7.5 % ,7.4 % ,last visit was down  to 6.7 % .   She denies polyphagia, polydipsia, or polyuria.. She is taking ARB for urine micro. l .Seen by  Nephrologist - Dr. Candiss Norse - in the pas but has been released. Hyperparathyroidism resolved. 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 She denies anhedonia, she has been going to the gym multiple times a week, denies crying spells,  still has difficulty making decisions ( like picking out an outfit) but not about finances. Her mother died 02-14-20. She has noticed some difficulty falling asleep sometimes but takes bzd prn for that    Dyslipidemia: on Atorvastatin,denies myalgias, doing well on medication. Last  LDL went up a little, we will recheck yearly    Vitamin D deficiency: last labs were at goal, continue supplementation   Trigger finger index: usually when she is using her phone, not bad enough to get injection at this time   Patient Active Problem List   Diagnosis Date Noted   Hypertension associated with type 2 diabetes mellitus ([place]) 40/98/1191   Umbilical hernia without obstruction and without gangrene 09/13/2016   Diabetes mellitus with renal manifestations, controlled ([place]) 09/07/2014   Adult hypothyroidism 09/07/2014   Vitamin D deficiency 09/07/2014   Benign essential HTN 09/07/2014   Microalbuminuria 09/07/2014   Dyslipidemia 09/07/2014   Major depression in full remission ([place]) 09/07/2014   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/07/2014   Calculus of kidney 09/07/2014   Memory loss or impairment 09/07/2014    Past Surgical History:  Procedure Laterality Date   BREAST BIOPSY Right    negative times 2   COLONOSCOPY WITH PROPOFOL N/A 01/20/2015   Procedure: COLONOSCOPY WITH PROPOFOL;  Surgeon: Manya Silvas, MD;  Location: [place];  Service: Endoscopy;  Laterality: N/A;   COLONOSCOPY WITH PROPOFOL N/A 05/07/2020   Procedure: COLONOSCOPY WITH PROPOFOL;  Surgeon: Toledo, Benay Pike, MD;  Location: [facility];  Service: Gastroenterology;  Laterality: N/A;  LITHOTRIPSY Left 12/2013    Family History  Problem Relation Age of Onset   Osteoarthritis Mother    Kidney failure Mother    Failure to thrive Mother    Osteoarthritis Sister    Alzheimer's disease Maternal Aunt    Breast cancer Neg Hx     Social History   Tobacco Use   Smoking status: Never   Smokeless tobacco: Never   Tobacco comments:    Smoking cessation materials not required  Substance Use Topics   Alcohol use: No    Alcohol/week: 0.0 standard drinks of alcohol     Current Outpatient Medications:    ACCU-CHEK GUIDE test strip, USE 1 TEST STRIP TWICE DAILY AS  NEEDED,  Disp: 100 strip, Rfl: 6   atorvastatin (LIPITOR) 80 MG tablet, TAKE 1 TABLET BY MOUTH IN THE  EVENING, Disp: 90 tablet, Rfl: 0   blood glucose meter kit and supplies, Dispense based on patient and insurance preference. Check bid daily as directed. (FOR ICD-10 E10.9, E11.9)., Disp: 1 each, Rfl: 0   cholecalciferol (VITAMIN D3) 25 MCG (1000 UNIT) tablet, Take 1 tablet (1,000 Units total) by mouth daily., Disp: 90 tablet, Rfl: 1   citalopram (CELEXA) 40 MG tablet, TAKE 1 TABLET BY MOUTH DAILY, Disp: 90 tablet, Rfl: 0   clobetasol cream (TEMOVATE) 8.33 %, Apply 1 application topically 2 (two) times daily., Disp: 45 g, Rfl: 0   clonazePAM (KLONOPIN) 0.5 MG tablet, Take 1 tablet (0.5 mg total) by mouth 2 (two) times daily as needed., Disp: 30 tablet, Rfl: 0   hydroquinone 4 % cream, APPLY TOPICALLY TO DARK SPOTS ON LEGS TWICE DAILY TO HELP FADE, Disp: 28.35 g, Rfl: 0   levothyroxine (SYNTHROID) 75 MCG tablet, TAKE 1 TABLET BY MOUTH DAILY  BEFORE BREAKFAST, Disp: 100 tablet, Rfl: 0   loratadine (CLARITIN) 10 MG tablet, Take 1 tablet (10 mg total) by mouth daily., Disp: 90 tablet, Rfl: 1   losartan (COZAAR) 100 MG tablet, TAKE 1 TABLET BY MOUTH DAILY, Disp: 90 tablet, Rfl: 0   metFORMIN (GLUCOPHAGE-XR) 750 MG 24 hr tablet, TAKE 1 TABLET BY MOUTH TWICE  DAILY, Disp: 160 tablet, Rfl: 0   tacrolimus (PROTOPIC) 0.1 % ointment, Apply to affected areas once to twice daily as directed., Disp: 100 g, Rfl: 1  Allergies  Allergen Reactions   Sulfa Antibiotics     I personally reviewed active problem list, medication list, allergies, family history, social history, health maintenance with the patient/caregiver today.   ROS  ***  Objective  There were no vitals filed for this visit.  There is no height or weight on file to calculate BMI.  Physical Exam ***  No results found for this or any previous visit (from the past 2160 hour(s)).  Diabetic Foot Exam: Diabetic Foot Exam - Simple   No data  filed    ***  PHQ2/9:    07/17/2021    9:56 AM 04/21/2021    8:51 AM 01/07/2021   10:33 AM 08/01/2020    8:58 AM 04/01/2020   10:06 AM  Depression screen PHQ 2/9  Decreased Interest 0 0 0 0 0  Down, Depressed, Hopeless 1 0 0 0 0  PHQ - 2 Score 1 0 0 0 0  Altered sleeping 1  0 0   Tired, decreased energy 0  0 0   Change in appetite 0  0 0   Feeling bad or failure about yourself  0  0 0   Trouble concentrating 0  0 0   Moving slowly or fidgety/restless 0  0 0   Suicidal thoughts 0  0 0   PHQ-9 Score 2  0 0     phq 9 is {gen pos [id number]}   Fall Risk:    07/17/2021    9:56 AM 04/21/2021    8:53 AM 01/07/2021   10:33 AM 08/01/2020    8:58 AM 04/01/2020   10:08 AM  Fall Risk   Falls in the past year? 0 0 0 0 0  Number falls in past yr: 0 0 0 0 0  Injury with Fall? 0 0 0 0 0  Risk for fall due to : No Fall Risks No Fall Risks   No Fall Risks  Follow up Falls prevention discussed Falls prevention discussed   Falls prevention discussed      Functional Status Survey:      Assessment & Plan  *** There are no diagnoses linked to this encounter.

## 2022-01-14 ENCOUNTER — Ambulatory Visit (INDEPENDENT_AMBULATORY_CARE_PROVIDER_SITE_OTHER): Payer: Medicare Other | Admitting: Family Medicine

## 2022-01-14 ENCOUNTER — Encounter: Payer: Self-pay | Admitting: Family Medicine

## 2022-01-14 VITALS — BP 134/74 | HR 95 | Resp 16 | Ht 59.0 in | Wt 149.0 lb

## 2022-01-14 DIAGNOSIS — E559 Vitamin D deficiency, unspecified: Secondary | ICD-10-CM

## 2022-01-14 DIAGNOSIS — E1129 Type 2 diabetes mellitus with other diabetic kidney complication: Secondary | ICD-10-CM

## 2022-01-14 DIAGNOSIS — E1159 Type 2 diabetes mellitus with other circulatory complications: Secondary | ICD-10-CM

## 2022-01-14 DIAGNOSIS — N1831 Chronic kidney disease, stage 3a: Secondary | ICD-10-CM

## 2022-01-14 DIAGNOSIS — E785 Hyperlipidemia, unspecified: Secondary | ICD-10-CM

## 2022-01-14 DIAGNOSIS — I152 Hypertension secondary to endocrine disorders: Secondary | ICD-10-CM

## 2022-01-14 DIAGNOSIS — I10 Essential (primary) hypertension: Secondary | ICD-10-CM

## 2022-01-14 DIAGNOSIS — F325 Major depressive disorder, single episode, in full remission: Secondary | ICD-10-CM

## 2022-01-14 DIAGNOSIS — Z79899 Other long term (current) drug therapy: Secondary | ICD-10-CM | POA: Insufficient documentation

## 2022-01-14 DIAGNOSIS — R809 Proteinuria, unspecified: Secondary | ICD-10-CM | POA: Diagnosis not present

## 2022-01-14 DIAGNOSIS — E039 Hypothyroidism, unspecified: Secondary | ICD-10-CM

## 2022-01-14 LAB — POCT GLYCOSYLATED HEMOGLOBIN (HGB A1C): Hemoglobin A1C: 6.6 % — AB (ref 4.0–5.6)

## 2022-01-14 MED ORDER — CITALOPRAM HYDROBROMIDE 40 MG PO TABS: 40.0000 mg | ORAL_TABLET | Freq: Every day | ORAL | 1 refills | Status: DC

## 2022-01-14 MED ORDER — RYBELSUS 7 MG PO TABS: 7.0000 mg | ORAL_TABLET | Freq: Every day | ORAL | 0 refills | Status: DC

## 2022-01-14 MED ORDER — LOSARTAN POTASSIUM 100 MG PO TABS: 100.0000 mg | ORAL_TABLET | Freq: Every day | ORAL | 1 refills | Status: DC

## 2022-01-14 MED ORDER — ATORVASTATIN CALCIUM 80 MG PO TABS: 80.0000 mg | ORAL_TABLET | Freq: Every evening | ORAL | 1 refills | Status: DC

## 2022-01-14 NOTE — Patient Instructions (Signed)
We will start Rybelsus 3 mg and continue taking metfomin two daily Once you go up to Rybelsus 7 mg dose you can go down on Metformin to one pill daily and when glucose in the mornings gets to 100 you can stop taking metformin and monitor

## 2022-01-15 ENCOUNTER — Encounter: Payer: Self-pay | Admitting: Family Medicine

## 2022-01-15 LAB — COMPLETE METABOLIC PANEL WITH GFR
AG Ratio: 1.3 (calc) (ref 1.0–2.5)
ALT: 26 U/L (ref 6–29)
AST: 46 U/L — ABNORMAL HIGH (ref 10–35)
Albumin: 3.9 g/dL (ref 3.6–5.1)
Alkaline phosphatase (APISO): 94 U/L (ref 37–153)
BUN/Creatinine Ratio: 12 (calc) (ref 6–22)
BUN: 14 mg/dL (ref 7–25)
CO2: 22 mmol/L (ref 20–32)
Calcium: 9.9 mg/dL (ref 8.6–10.4)
Chloride: 106 mmol/L (ref 98–110)
Creat: 1.14 mg/dL — ABNORMAL HIGH (ref 0.60–1.00)
Globulin: 3.1 g/dL (calc) (ref 1.9–3.7)
Glucose, Bld: 137 mg/dL — ABNORMAL HIGH (ref 65–99)
Potassium: 4.5 mmol/L (ref 3.5–5.3)
Sodium: 140 mmol/L (ref 135–146)
Total Bilirubin: 0.5 mg/dL (ref 0.2–1.2)
Total Protein: 7 g/dL (ref 6.1–8.1)
eGFR: 51 mL/min/{1.73_m2} — ABNORMAL LOW (ref >=60)

## 2022-01-15 LAB — TSH: TSH: 0.94 mIU/L (ref 0.40–4.50)

## 2022-01-15 LAB — LIPID PANEL
Cholesterol: 108 mg/dL (ref <200)
HDL: 34 mg/dL — ABNORMAL LOW (ref >=50)
LDL Cholesterol (Calc): 55 mg/dL (calc)
Non-HDL Cholesterol (Calc): 74 mg/dL (calc) (ref <130)
Total CHOL/HDL Ratio: 3.2 (calc) (ref <5.0)
Triglycerides: 106 mg/dL (ref <150)

## 2022-01-15 LAB — VITAMIN B12: Vitamin B-12: 235 pg/mL (ref 200–1100)

## 2022-01-15 LAB — MICROALBUMIN / CREATININE URINE RATIO
Creatinine, Urine: 103 mg/dL (ref 20–275)
Microalb Creat Ratio: 50 mcg/mg creat — ABNORMAL HIGH (ref <30)
Microalb, Ur: 5.1 mg/dL

## 2022-02-10 ENCOUNTER — Other Ambulatory Visit: Payer: Self-pay | Admitting: Family Medicine

## 2022-02-10 ENCOUNTER — Telehealth: Payer: Self-pay

## 2022-02-10 ENCOUNTER — Ambulatory Visit: Payer: Self-pay

## 2022-02-10 DIAGNOSIS — E1129 Type 2 diabetes mellitus with other diabetic kidney complication: Secondary | ICD-10-CM

## 2022-02-10 MED ORDER — RYBELSUS 7 MG PO TABS: 7.0000 mg | ORAL_TABLET | Freq: Every day | ORAL | 0 refills | Status: DC

## 2022-02-10 MED ORDER — METFORMIN HCL ER 750 MG PO TB24: 750.0000 mg | ORAL_TABLET | Freq: Two times a day (BID) | ORAL | 0 refills | Status: DC

## 2022-02-10 NOTE — Telephone Encounter (Signed)
Pt is waiting on her Rx of Rybelsus to come in the mail, she wants to know if she is safe to miss a few days of taking this   Best contact: [phone]  Pt. Has not received mail order of Rybelsus yet. Asking if the practice has samples until order arrives. Please advise pt. [facility] did not receive refill. Please fax to [phone].  Answer Assessment - Initial Assessment Questions 1. DRUG NAME: "What medicine do you need to have refilled?"     Rybelsus - Mail order pharmacy does not have new order yet. Pt. Asking id practice has samples. 2. REFILLS REMAINING: "How many refills are remaining?" (Note: The label on the medicine or pill bottle will show how many refills are remaining. If there are no refills remaining, then a renewal may be needed.)       3. EXPIRATION DATE: "What is the expiration date?" (Note: The label states when the prescription will expire, and thus can no longer be refilled.)       4. PRESCRIBING HCP: "Who prescribed it?" Reason: If prescribed by specialist, call should be referred to that group.     Dr. Carlynn Purl 5. SYMPTOMS: "Do you have any symptoms?"     N/a 6. PREGNANCY: "Is there any chance that you are pregnant?" "When was your last menstrual period?"     No  Protocols used: Medication Refill and Renewal Call-A-AH

## 2022-02-10 NOTE — Telephone Encounter (Signed)
Called and let patient know. She gave verbal understanding.

## 2022-02-10 NOTE — Telephone Encounter (Signed)
Copied from CRM [id number]. Topic: General - Call Back - No Documentation >> Feb 10, 2022 12:42 PM Megan Short wrote: Reason for CRM: Pt called reporting that [facility] has told her that they do not have an order of rebelsus for her even though we have receipt confirming that they do.  She says she has three pills left

## 2022-02-19 ENCOUNTER — Other Ambulatory Visit: Payer: Self-pay | Admitting: Family Medicine

## 2022-02-19 DIAGNOSIS — E785 Hyperlipidemia, unspecified: Secondary | ICD-10-CM

## 2022-02-19 DIAGNOSIS — F325 Major depressive disorder, single episode, in full remission: Secondary | ICD-10-CM

## 2022-02-19 DIAGNOSIS — I10 Essential (primary) hypertension: Secondary | ICD-10-CM

## 2022-02-19 DIAGNOSIS — E1129 Type 2 diabetes mellitus with other diabetic kidney complication: Secondary | ICD-10-CM

## 2022-03-01 ENCOUNTER — Telehealth: Payer: Self-pay

## 2022-03-01 NOTE — Telephone Encounter (Signed)
Pt returning call  Per FC cma was unavailable  Please fu w/ pt

## 2022-03-01 NOTE — Telephone Encounter (Signed)
Called and LVM for callback about which starting dose she had.

## 2022-03-01 NOTE — Telephone Encounter (Signed)
Patient called and stated since starting Rybelsus she is nauseated, low appetite, and low energy. I know Rybelsus will curve appetite. She wants to know when symptoms will subside and that she has only been on it for 2 weeks. Please advise.

## 2022-03-09 ENCOUNTER — Other Ambulatory Visit: Payer: Self-pay | Admitting: Dermatology

## 2022-03-09 DIAGNOSIS — L309 Dermatitis, unspecified: Secondary | ICD-10-CM

## 2022-03-11 ENCOUNTER — Other Ambulatory Visit: Payer: Self-pay | Admitting: Family Medicine

## 2022-03-11 DIAGNOSIS — E039 Hypothyroidism, unspecified: Secondary | ICD-10-CM

## 2022-03-12 NOTE — Telephone Encounter (Signed)
Requested Prescriptions  Pending Prescriptions Disp Refills  . levothyroxine (SYNTHROID) 75 MCG tablet [Pharmacy Med Name: Levothyroxine Sodium 75 MCG Oral Tablet] 100 tablet 0    Sig: TAKE 1 TABLET BY MOUTH DAILY  BEFORE BREAKFAST     Endocrinology:  Hypothyroid Agents Passed - 03/11/2022 10:18 PM      Passed - TSH in normal range and within 360 days    TSH  Date Value Ref Range Status  01/14/2022 0.94 0.40 - 4.50 mIU/L Final         Passed - Valid encounter within last 12 months    Recent Outpatient Visits          1 month ago Controlled type 2 diabetes mellitus with microalbuminuria, without long-term current use of insulin [facility])   [facility] [place], Drue Stager, MD   7 months ago Controlled type 2 diabetes mellitus with microalbuminuria, without long-term current use of insulin [facility])   [facility] [place], Drue Stager, MD   1 year ago Controlled type 2 diabetes mellitus with microalbuminuria, without long-term current use of insulin [facility])   [facility] [place], Drue Stager, MD   1 year ago Controlled type 2 diabetes mellitus with microalbuminuria, without long-term current use of insulin [facility])   [facility] [place], Drue Stager, MD   2 years ago Controlled type 2 diabetes mellitus with microalbuminuria, without long-term current use of insulin [facility])   [facility] [name], MD      Future Appointments            In 1 month  [place]   In 2 months [name], MD [facility], [facility]

## 2022-03-27 ENCOUNTER — Other Ambulatory Visit: Payer: Self-pay | Admitting: Family Medicine

## 2022-03-27 DIAGNOSIS — F325 Major depressive disorder, single episode, in full remission: Secondary | ICD-10-CM

## 2022-04-09 ENCOUNTER — Other Ambulatory Visit: Payer: Self-pay | Admitting: Family Medicine

## 2022-04-09 DIAGNOSIS — E1129 Type 2 diabetes mellitus with other diabetic kidney complication: Secondary | ICD-10-CM

## 2022-04-09 DIAGNOSIS — I10 Essential (primary) hypertension: Secondary | ICD-10-CM

## 2022-04-09 DIAGNOSIS — E785 Hyperlipidemia, unspecified: Secondary | ICD-10-CM

## 2022-04-13 ENCOUNTER — Other Ambulatory Visit: Payer: Self-pay | Admitting: Family Medicine

## 2022-04-13 DIAGNOSIS — E1129 Type 2 diabetes mellitus with other diabetic kidney complication: Secondary | ICD-10-CM

## 2022-04-22 ENCOUNTER — Ambulatory Visit (INDEPENDENT_AMBULATORY_CARE_PROVIDER_SITE_OTHER): Payer: Medicare Other

## 2022-04-22 DIAGNOSIS — Z0000 Encounter for general adult medical examination without abnormal findings: Secondary | ICD-10-CM | POA: Diagnosis not present

## 2022-04-22 NOTE — Progress Notes (Signed)
I connected with  [name] on 04/22/22 by a audio enabled telemedicine application and verified that I am speaking with the correct person using two identifiers.  Patient Location: Home  Provider Location: Office/Clinic  I discussed the limitations of evaluation and management by telemedicine. The patient expressed understanding and agreed to proceed.  Subjective:   Megan Short is a 76 y.o. female who presents for Medicare Annual (Subsequent) preventive examination.  Review of Systems    Per HPI unless specifically indicated below.  Cardiac Risk Factors include: advanced age (>58mn, >>9women);female gender          Objective:       01/14/2022    9:59 AM 07/17/2021    9:56 AM 04/21/2021    8:46 AM  Vitals with BMI  Height _0  _1  _2   Weight 149 lbs 147 lbs 148 lbs 2 oz  BMI 30.08 [id number] Systolic [id number] Diastolic 74 84 86  Pulse 95 90 99    There were no vitals filed for this visit. There is no height or weight on file to calculate BMI.     04/22/2022    4:06 PM 04/21/2021    8:52 AM 05/07/2020   12:39 PM 04/01/2020   10:07 AM 03/27/2019   10:05 AM 12/14/2016   10:23 AM 09/13/2016    8:46 AM  Advanced Directives  Does Patient Have a Medical Advance Directive? Yes Yes No No Yes No No  Type of AParamed[place]of A[place] ArmLiving will   [place] Courthouse[place] will    Does patient want to make changes to medical advance directive? No - Patient declined        Copy of [facility] Chart? No - copy requested No - copy requested   No - copy requested    Would patient like information on creating a medical advance directive?   No - Patient declined Yes (MAU/Ambulatory/Procedural Areas - Information given)       Current Medications (verified) Outpatient Encounter Medications as of 04/22/2022  Medication Sig   ACCU-CHEK GUIDE test strip USE 1 TEST STRIP TWICE DAILY AS   NEEDED   atorvastatin (LIPITOR) 80 MG tablet TAKE 1 TABLET BY MOUTH IN THE  EVENING   blood glucose meter kit and supplies Dispense based on patient and insurance preference. Check bid daily as directed. (FOR ICD-10 E10.9, E11.9).   cholecalciferol (VITAMIN D3) 25 MCG (1000 UNIT) tablet Take 1 tablet (1,000 Units total) by mouth daily.   citalopram (CELEXA) 40 MG tablet Take 1 tablet (40 mg total) by mouth daily.   clonazePAM (KLONOPIN) 0.5 MG tablet Take 1 tablet by mouth twice daily as needed   hydroquinone 4 % cream APPLY  CREAM TOPICALLY TO DARK SPOTS ON LEGS TWICE DAILY TO HELP FADE   levothyroxine (SYNTHROID) 75 MCG tablet TAKE 1 TABLET BY MOUTH DAILY  BEFORE BREAKFAST   losartan (COZAAR) 100 MG tablet TAKE 1 TABLET BY MOUTH DAILY   Semaglutide (RYBELSUS) 7 MG TABS TAKE 1 TABLET BY MOUTH DAILY   tacrolimus (PROTOPIC) 0.1 % ointment Apply to affected areas once to twice daily as directed.   loratadine (CLARITIN) 10 MG tablet Take 1 tablet (10 mg total) by mouth daily.   metFORMIN (GLUCOPHAGE-XR) 750 MG 24 hr tablet Take 1 tablet (750 mg total) by mouth 2 (two) times daily. (Patient not taking: Reported on 04/22/2022)   [DISCONTINUED] clobetasol cream (TEMOVATE)  5.78 % Apply 1 application topically 2 (two) times daily. (Patient not taking: Reported on 04/22/2022)   No facility-administered encounter medications on file as of 04/22/2022.    Allergies (verified) Sulfa antibiotics   History: Past Medical History:  Diagnosis Date   Anxiety    Depression    Diabetes mellitus without complication ([place])    Hyperlipidemia    Hypertension    Hypothyroidism    Past Surgical History:  Procedure Laterality Date   BREAST BIOPSY Right    negative times 2   COLONOSCOPY WITH PROPOFOL N/A 01/20/2015   Procedure: COLONOSCOPY WITH PROPOFOL;  Surgeon: Manya Silvas, MD;  Location: [facility];  Service: Endoscopy;  Laterality: N/A;   COLONOSCOPY WITH PROPOFOL N/A 05/07/2020   Procedure:  COLONOSCOPY WITH PROPOFOL;  Surgeon: Toledo, Benay Pike, MD;  Location: [facility] END[facility]COPY;  Service: Gastroenterology;  Laterality: N/A;   LITHOTRIPSY Left 12/2013   Family History  Problem Relation Age of Onset   Osteoarthritis Mother    Kidney failure Mother    Failure to thrive Mother    Osteoarthritis Sister    Alzheimer's disease Maternal Aunt    Breast cancer Neg Hx    Social History   Socioeconomic History   Marital status: Married    Spouse name: Mortimer Fries    Number of children: 0   Years of education: Not on file   Highest education level: Some college, no degree  Occupational History   Occupation: retired     Comment: Therapist, art rep   Tobacco Use   Smoking status: Never   Smokeless tobacco: Never   Tobacco comments:    Smoking cessation materials not required  Vaping Use   Vaping Use: Never used  Substance and Sexual Activity   Alcohol use: No    Alcohol/week: 0.0 standard drinks of alcohol   Drug use: No   Sexual activity: Not Currently  Other Topics Concern   Not on file  Social History Narrative   Married, they had one son that died with complications of lupus   Social Determinants of Health   Financial Resource Strain: Low Risk  (04/21/2021)   Overall Financial Resource Strain (CARDIA)    Difficulty of Paying Living Expenses: Not hard at all  Food Insecurity: No Food Insecurity (04/22/2022)   Hunger Vital Sign    Worried About Running Out of Food in the Last Year: Never true    [place] in the Last Year: Never true  Transportation Needs: No Transportation Needs (04/22/2022)   PRAPARE - Hydrologist (Medical): No    Lack of Transportation (Non-Medical): No  Physical Activity: Sufficiently Active (04/22/2022)   Exercise Vital Sign    Days of Exercise per Week: 5 days    Minutes of Exercise per Session: 80 min  Stress: No Stress Concern Present (04/22/2022)   [place]    Feeling of Stress : Only a little  Social Connections: Socially Integrated (04/22/2022)   Social Connection and Isolation Panel [NHANES]    Frequency of Communication with Friends and Family: More than three times a week    Frequency of Social Gatherings with Friends and Family: More than three times a week    Attends Religious Services: More than 4 times per year    Active Member of [employer] or Organizations: Yes    Attends Archivist Meetings: More than 4 times per year    Marital  Status: Married    Tobacco Counseling Counseling given: No Tobacco comments: Smoking cessation materials not required   Clinical Intake:  Pre-visit preparation completed: No  Pain : No/denies pain     Nutritional Status: BMI 25 -29 Overweight Nutritional Risks: None Diabetes: Yes CBG done?: Yes CBG resulted in Enter/ Edit results?: No Did pt. bring in CBG monitor from home?: No  How often do you need to have someone help you when you read instructions, pamphlets, or other written materials from your doctor or pharmacy?: 1 - Never  Diabetic?Nutrition Risk Assessment:  Has the patient had any N/V/D within the last 2 months?  No  Does the patient have any non-healing wounds?  No  Has the patient had any unintentional weight loss or weight gain?  No   Diabetes:  Is the patient diabetic?  Yes  If diabetic, was a CBG obtained today?  No  Did the patient bring in their glucometer from home?  No  How often do you monitor your CBG's? Daily .   Financial Strains and Diabetes Management:  Are you having any financial strains with the device, your supplies or your medication? No .  Does the patient want to be seen by Chronic Care Management for management of their diabetes?  No  Would the patient like to be referred to a Nutritionist or for Diabetic Management?  No   Diabetic Exams:  Diabetic Eye Exam: Completed 07/30/2021 Diabetic Foot Exam: Completed 01/14/2022        Information entered by :: Donnie Mesa, CMA   Activities of Daily Living    04/22/2022    1:57 PM 01/14/2022    9:59 AM  In your present state of health, do you have any difficulty performing the following activities:  Hearing? 0 0  Vision? 1 0  Difficulty concentrating or making decisions? 0 0  Walking or climbing stairs? 0 0  Dressing or bathing? 0 0  Doing errands, shopping? 0 0    Patient Care Team: [name], MD as PCP - General (Family Medicine) Gardiner Barefoot, DPM as Consulting Physician (Podiatry) Baxter Kail, MD as Consulting Physician (Dermatology) Germaine Pomfret, [facility] as Pharmacist (Pharmacist)  Indicate any recent Medical Services you may have received from other than [facility] providers in the past year (date may be approximate). No hospitalization in the past 12 months.     Assessment:   This is a routine wellness examination for Megan Short.  Hearing/Vision screen Denies any difficulty hearing. Denies any changes in vision, wear glasses. Annual Eye Exam, [facility]   Dietary issues and exercise activities discussed: Current Exercise Habits: Structured exercise class, Type of exercise: stretching;treadmill;yoga;walking, Time (Minutes): > 60, Frequency (Times/Week): 5, Weekly Exercise (Minutes/Week): 0, Intensity: Moderate   Goals Addressed             This Visit's Progress    Stay Active and Independent       Why is this important?   Regular activity or exercise is important to managing back pain.  Activity helps to keep your muscles strong.  You will sleep better and feel more relaxed.  You will have more energy and feel less stressed.  If you are not active now, start slowly. Little changes make a big difference.  Rest, but not too much.  Stay as active as you can and listen to your body's signals.            Depression Screen    04/22/2022    1:56  PM 01/14/2022    9:59 AM 07/17/2021    9:56 AM 04/21/2021    8:51 AM  01/07/2021   10:33 AM 08/01/2020    8:58 AM 04/01/2020   10:06 AM  PHQ 2/9 Scores  PHQ - 2 Score 0 0 1 0 0 0 0  PHQ- 9 Score  0 2  0 0     Fall Risk    04/22/2022    1:56 PM 01/14/2022    9:59 AM 07/17/2021    9:56 AM 04/21/2021    8:53 AM 01/07/2021   10:33 AM  Fall Risk   Falls in the past year? 0 0 0 0 0  Number falls in past yr: 0 0 0 0 0  Injury with Fall? 0 0 0 0 0  Risk for fall due to : No Fall Risks No Fall Risks No Fall Risks No Fall Risks   Follow up Falls evaluation completed Falls prevention discussed Falls prevention discussed Falls prevention discussed     FALL RISK PREVENTION PERTAINING TO THE HOME:  Any stairs in or around the home? No  If so, are there any without handrails? No  Home free of loose throw rugs in walkways, pet beds, electrical cords, etc? Yes  Adequate lighting in your home to reduce risk of falls? Yes   ASSISTIVE DEVICES UTILIZED TO PREVENT FALLS:  Life alert? No  Use of a cane, walker or w/c? No  Grab bars in the bathroom? Yes  Shower chair or bench in shower? No  Elevated toilet seat or a handicapped toilet? Yes   TIMED UP AND GO:  Was the test performed? No .  Cognitive Function:        04/22/2022    1:57 PM 04/01/2020   10:09 AM 03/27/2019   10:08 AM 03/24/2018   11:01 AM  6CIT Screen  What Year? 0 points 0 points 0 points 0 points  What month? 0 points 0 points 0 points 0 points  What time? 0 points 0 points 0 points 0 points  Count back from 20 0 points 0 points 0 points 0 points  Months in reverse 0 points 0 points 0 points 0 points  Repeat phrase 0 points 0 points 0 points 4 points  Total Score 0 points 0 points 0 points 4 points    Immunizations Immunization History  Administered Date(s) Administered   Fluad Quad(high Dose 65+) 03/07/2019, 04/01/2020   Influenza Split 01/30/2014   Influenza, High Dose Seasonal PF 02/12/2015, 05/13/2016, 03/23/2017, 03/21/2018, 04/16/2021, 03/10/2022   Moderna Covid-19 Vaccine  Bivalent Booster 45yr & up 03/30/2022   PFIZER(Purple Top)SARS-COV-2 Vaccination 06/29/2019, 07/20/2019, 03/01/2020, 10/24/2020   Pneumococcal Conjugate-13 08/08/2014   Pneumococcal Polysaccharide-23 05/27/2010, 09/10/2015   Respiratory Syncytial Virus Vaccine,Recomb Aduvanted(Arexvy) 03/10/2022   Tdap 05/27/2010, 08/01/2020   Zoster Recombinat (Shingrix) 07/18/2021, 10/09/2021   Zoster, Live 05/31/2011    TDAP status: Up to date  Flu Vaccine status: Up to date  Pneumococcal vaccine status: Up to date  Covid-19 vaccine status: Completed vaccines  Qualifies for Shingles Vaccine? Yes   Zostavax completed Yes   Shingrix Completed?: Yes  Screening Tests Health Maintenance  Topic Date Due   COVID-19 Vaccine (6 - Pfizer risk series) 05/25/2022   HEMOGLOBIN A1C  07/17/2022   OPHTHALMOLOGY EXAM  07/30/2022   MAMMOGRAM  12/17/2022   Diabetic kidney evaluation - GFR measurement  01/15/2023   Diabetic kidney evaluation - Urine ACR  01/15/2023   FOOT EXAM  01/15/2023   Medicare  Annual Wellness (AWV)  04/23/2023   COLONOSCOPY (Pts 45-24yr Insurance coverage will need to be confirmed)  05/07/2025   TETANUS/TDAP  08/01/2030   Pneumonia Vaccine 75 Years old  Completed   INFLUENZA VACCINE  Completed   DEXA SCAN  Completed   Hepatitis C Screening  Completed   Zoster Vaccines- Shingrix  Completed   HPV VACCINES  Aged Out    Health Maintenance  There are no preventive care reminders to display for this patient.   Colorectal cancer screening: Type of screening: Colonoscopy. Completed 05/07/2020. Repeat every 5 years  Mammogram status: Completed 12/16/2021. Repeat every year  DEXA Scan: 09/06/2013  Lung Cancer Screening: (Low Dose CT Chest recommended if Age 75-80years, 30 pack-year currently smoking OR have quit w/in 15years.) does not qualify.     Additional Screening:  Hepatitis C Screening: does qualify; Completed 03/07/2022  Vision Screening: Recommended annual  ophthalmology exams for early detection of glaucoma and other disorders of the eye. Is the patient up to date with their annual eye exam?  Yes  Who is the provider or what is the name of the office in which the patient attends annual eye exams? [facility]  If pt is not established with a provider, would they like to be referred to a provider to establish care? No .   Dental Screening: Recommended annual dental exams for proper oral hygiene  Community Resource Referral / Chronic Care Management: CRR required this visit?  No   CCM required this visit?  No      Plan:     I have personally reviewed and noted the following in the patient's chart:   Medical and social history Use of alcohol, tobacco or illicit drugs  Current medications and supplements including opioid prescriptions. Patient is not currently taking opioid prescriptions. Functional ability and status Nutritional status Physical activity Advanced directives List of other physicians Hospitalizations, surgeries, and ER visits in previous 12 months Vitals Screenings to include cognitive, depression, and falls Referrals and appointments  In addition, I have reviewed and discussed with patient certain preventive protocols, quality metrics, and best practice recommendations. A written personalized care plan for preventive services as well as general preventive health recommendations were provided to patient.    Megan Short, Thank you for taking time to come for your Medicare Wellness Visit. I appreciate your ongoing commitment to your health goals. Please review the following plan we discussed and let me know if I can assist you in the future.   These are the goals we discussed:  Goals      DIET - EAT MORE FRUITS AND VEGETABLES     DIET - INCREASE WATER INTAKE     Recommend drinking 6-8 glasses of water per day      Monitor and Manage My Blood Sugar-Diabetes Type 2     Timeframe:  Long-Range Goal Priority:   High Start Date:    07/16/2020                         Expected End Date:  01/15/2022                     Follow Up within 90 days    - check blood sugar at prescribed times - check blood sugar if I feel it is too high or too low    Why is this important?   Checking your blood sugar at home helps to keep  it from getting very high or very low.  Writing the results in a diary or log helps the doctor know how to care for you.  Your blood sugar log should have the time, date and the results.  Also, write down the amount of insulin or other medicine that you take.  Other information, like what you ate, exercise done and how you were feeling, will also be helpful.     Notes:      Stay Active and Independent     Why is this important?   Regular activity or exercise is important to managing back pain.  Activity helps to keep your muscles strong.  You will sleep better and feel more relaxed.  You will have more energy and feel less stressed.  If you are not active now, start slowly. Little changes make a big difference.  Rest, but not too much.  Stay as active as you can and listen to your body's signals.          Weight (lb) < 135 lb (61.2 kg)     Pt states she would like to lose weight with healthy eating and physical activity         This is a list of the screening recommended for you and due dates:  Health Maintenance  Topic Date Due   COVID-19 Vaccine (6 - Pfizer risk series) 05/25/2022   Hemoglobin A1C  07/17/2022   Eye exam for diabetics  07/30/2022   Mammogram  12/17/2022   Yearly kidney function blood test for diabetes  01/15/2023   Yearly kidney health urinalysis for diabetes  01/15/2023   Complete foot exam   01/15/2023   Medicare Annual Wellness Visit  04/23/2023   Colon Cancer Screening  05/07/2025   Tetanus Vaccine  08/01/2030   Pneumonia Vaccine  Completed   Flu Shot  Completed   DEXA scan (bone density measurement)  Completed   Hepatitis C Screening: USPSTF  Recommendation to screen - Ages 29-79 yo.  Completed   Zoster (Shingles) Vaccine  Completed   HPV Vaccine  Aged [address], [place]   04/22/2022   Nurse Notes: Approximately 30 minute Non-face-To-Face Medicare Wellness Visit

## 2022-04-22 NOTE — Patient Instructions (Signed)

## 2022-05-17 NOTE — Progress Notes (Signed)
Name: Megan Short   MRN: [id number]    DOB: Aug 02, 1945   Date:05/18/2022       Progress Note  Subjective  Chief Complaint  Follow Up  HPI  HTN: taking medication - Losartan  daily  and denies side effects. No chest pain, no palpitation or dizziness. Continue current medication   Rash she has a long history of rash on legs, it was getting worse and we referred her to Dermatologist, she saw Dr. Nicole Kindred , not itchy, she was diagnosed with eczema was given creams.   Vitamin B12 deficiency: she was taking Metformin and she is taking SL B12 , we will recheck next visit She was having tingling on her feet but has resolved now    Hypothyroidism: she denies hair loss, but she has dry skin,  dysphagia or change in bowel movements . She is taking Levothyroxine as prescribed last TSH at goal  CKI stage III: last GFR is 51 , advised to NSAID's , continue ARB and we will monitor, if it continues to drop we will add SGL-2 agonist Urine micro elevated at 50   DMII:  She is exercising again, eating smaller portions, she is on higher dose of Metformin at 1500 mg  A1C has gone up from 6.5 % to 7.5 % ,7.4 % 6.7 % up to 7%, 6.6 % we added Rybelsus and A1C is down to 5.7 % and weight is down 9 lbs .  She denies polyphagia, polydipsia, or polyuria.. She is taking ARB for CKI and  microalbuminuria . She was seen by  Nephrologist - Dr. Candiss Norse , Renal Hyperparathyroidism resolved. Doing well    Major Depression: it was caused by death of her son 15 years ago ( complications of lupus died at age 41) , she is doing well on Celexa and BZD's very seldom - last rx was given 07/2021 and she has 10 left  . She denies anhedonia, she has been going to the gym multiple times a week, denies crying spells,  still has difficulty making decisions ( like picking out an outfit) but not about finances. Her mother died 02-10-20. She has noticed that she has been thinking more about her son lately. Also worries about wars and people  suffering. Explained possibly due to seasonal changes    Dyslipidemia: on Atorvastatin,denies myalgias, doing well on medication. LDL at goal at 55    Vitamin D deficiency: last labs were at goal, continue supplementations   Patient Active Problem List   Diagnosis Date Noted   Essential hypertension 11/57/2620   Umbilical hernia without obstruction and without gangrene 09/13/2016   Stage 3a chronic kidney disease ([place]) 09/07/2014   Diabetes mellitus with renal manifestations, controlled ([place]) 09/07/2014   Adult hypothyroidism 09/07/2014   Vitamin D deficiency 09/07/2014   Benign essential HTN 09/07/2014   Microalbuminuria 09/07/2014   Dyslipidemia 09/07/2014   Major depression in remission ([place]) 09/07/2014   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/07/2014   Calculus of kidney 09/07/2014   Memory loss or impairment 09/07/2014    Past Surgical History:  Procedure Laterality Date   BREAST BIOPSY Right    negative times 2   COLONOSCOPY WITH PROPOFOL N/A 01/20/2015   Procedure: COLONOSCOPY WITH PROPOFOL;  Surgeon: Manya Silvas, MD;  Location: [facility];  Service: Endoscopy;  Laterality: N/A;   COLONOSCOPY WITH PROPOFOL N/A 05/07/2020   Procedure: COLONOSCOPY WITH PROPOFOL;  Surgeon: Toledo, Benay Pike, MD;  Location: [facility];  Service: Gastroenterology;  Laterality: N/A;   LITHOTRIPSY Left 12/2013    Family History  Problem Relation Age of Onset   Osteoarthritis Mother    Kidney failure Mother    Failure to thrive Mother    Osteoarthritis Sister    Alzheimer's disease Maternal Aunt    Breast cancer Neg Hx     Social History   Tobacco Use   Smoking status: Never   Smokeless tobacco: Never   Tobacco comments:    Smoking cessation materials not required  Substance Use Topics   Alcohol use: No    Alcohol/week: 0.0 standard drinks of alcohol     Current Outpatient Medications:    ACCU-CHEK GUIDE test strip, USE 1 TEST STRIP TWICE DAILY AS   NEEDED, Disp: 100 strip, Rfl: 6   blood glucose meter kit and supplies, Dispense based on patient and insurance preference. Check bid daily as directed. (FOR ICD-10 E10.9, E11.9)., Disp: 1 each, Rfl: 0   cholecalciferol (VITAMIN D3) 25 MCG (1000 UNIT) tablet, Take 1 tablet (1,000 Units total) by mouth daily., Disp: 90 tablet, Rfl: 1   clonazePAM (KLONOPIN) 0.5 MG tablet, Take 1 tablet by mouth twice daily as needed, Disp: 30 tablet, Rfl: 0   hydroquinone 4 % cream, APPLY  CREAM TOPICALLY TO DARK SPOTS ON LEGS TWICE DAILY TO HELP FADE, Disp: 29 g, Rfl: 0   tacrolimus (PROTOPIC) 0.1 % ointment, Apply to affected areas once to twice daily as directed., Disp: 100 g, Rfl: 1   atorvastatin (LIPITOR) 80 MG tablet, Take 1 tablet (80 mg total) by mouth every evening., Disp: 90 tablet, Rfl: 1   citalopram (CELEXA) 40 MG tablet, Take 1 tablet (40 mg total) by mouth daily., Disp: 90 tablet, Rfl: 1   levothyroxine (SYNTHROID) 75 MCG tablet, Take 1 tablet (75 mcg total) by mouth daily before breakfast., Disp: 100 tablet, Rfl: 1   loratadine (CLARITIN) 10 MG tablet, Take 1 tablet (10 mg total) by mouth daily., Disp: 90 tablet, Rfl: 1   losartan (COZAAR) 100 MG tablet, Take 1 tablet (100 mg total) by mouth daily., Disp: 90 tablet, Rfl: 1   Semaglutide (RYBELSUS) 7 MG TABS, Take 1 tablet by mouth daily., Disp: 100 tablet, Rfl: 1  Allergies  Allergen Reactions   Sulfa Antibiotics     I personally reviewed active problem list, medication list, allergies, family history, social history, health maintenance with the patient/caregiver today.   ROS  Constitutional: Negative for fever , positive for weight change.  Respiratory: Negative for cough and shortness of breath.   Cardiovascular: Negative for chest pain or palpitations.  Gastrointestinal: Negative for abdominal pain, no bowel changes.  Musculoskeletal: Negative for gait problem or joint swelling.  Skin: Negative for rash.  Neurological: Negative for  dizziness or headache.  No other specific complaints in a complete review of systems (except as listed in HPI above).   Objective  Vitals:   05/18/22 0928  BP: 126/72  Pulse: 92  Resp: 14  Temp: 98.1 F (36.7 C)  TempSrc: Oral  SpO2: 99%  Weight: 140 lb 11.2 oz (63.8 kg)  Height: 4' 11.5" (1.511 m)    Body mass index is 27.94 kg/m.  Physical Exam  Constitutional: Patient appears well-developed and well-nourished.  No distress.  HEENT: head atraumatic, normocephalic, pupils equal and reactive to light,, neck supple Cardiovascular: Normal rate, regular rhythm and normal heart sounds.  No murmur heard. No BLE edema. Pulmonary/Chest: Effort normal and breath sounds normal. No respiratory distress. Abdominal: Soft.  There  is no tenderness. Psychiatric: Patient has a normal mood and affect. behavior is normal. Judgment and thought content normal.   Recent Results (from the past 2160 hour(s))  POCT HgB A1C     Status: Abnormal   Collection Time: 05/18/22  9:30 AM  Result Value Ref Range   Hemoglobin A1C 5.7 (A) 4.0 - 5.6 %   HbA1c POC (<> result, manual entry)     HbA1c, POC (prediabetic range)     HbA1c, POC (controlled diabetic range)       PHQ2/9:    05/18/2022    9:29 AM 04/22/2022    1:56 PM 01/14/2022    9:59 AM 07/17/2021    9:56 AM 04/21/2021    8:51 AM  Depression screen PHQ 2/9  Decreased Interest 0 0 0 0 0  Down, Depressed, Hopeless 0 0 0 1 0  PHQ - 2 Score 0 0 0 1 0  Altered sleeping 0  0 1   Tired, decreased energy 0  0 0   Change in appetite 0  0 0   Feeling bad or failure about yourself  0  0 0   Trouble concentrating 0  0 0   Moving slowly or fidgety/restless 0  0 0   Suicidal thoughts 0  0 0   PHQ-9 Score 0  0 2     phq 9 is negative   Fall Risk:    05/18/2022    9:29 AM 04/22/2022    1:56 PM 01/14/2022    9:59 AM 07/17/2021    9:56 AM 04/21/2021    8:53 AM  Fall Risk   Falls in the past year? 0 0 0 0 0  Number falls in past yr:  0 0 0  0  Injury with Fall?  0 0 0 0  Risk for fall due to : _0   Follow up Falls prevention discussed;Education provided;Falls evaluation completed Falls evaluation completed Falls prevention discussed Falls prevention discussed Falls prevention discussed      Functional Status Survey: Is the patient deaf or have difficulty hearing?: No Does the patient have difficulty seeing, even when wearing glasses/contacts?: No Does the patient have difficulty concentrating, remembering, or making decisions?: No Does the patient have difficulty walking or climbing stairs?: No Does the patient have difficulty dressing or bathing?: No Does the patient have difficulty doing errands alone such as visiting a doctor's office or shopping?: No    Assessment & Plan  1. Controlled type 2 diabetes mellitus with microalbuminuria, without long-term current use of insulin (HCC)  - POCT HgB A1C - Semaglutide (RYBELSUS) 7 MG TABS; Take 1 tablet by mouth daily.  Dispense: 100 tablet; Refill: 1 - losartan (COZAAR) 100 MG tablet; Take 1 tablet (100 mg total) by mouth daily.  Dispense: 90 tablet; Refill: 1  2. Essential hypertension  - losartan (COZAAR) 100 MG tablet; Take 1 tablet (100 mg total) by mouth daily.  Dispense: 90 tablet; Refill: 1  3. Adult hypothyroidism  - levothyroxine (SYNTHROID) 75 MCG tablet; Take 1 tablet (75 mcg total) by mouth daily before breakfast.  Dispense: 100 tablet; Refill: 1  4. Major depression in remission ([place])  - citalopram (CELEXA) 40 MG tablet; Take 1 tablet (40 mg total) by mouth daily.  Dispense: 90 tablet; Refill: 1  5. Dyslipidemia  - atorvastatin (LIPITOR) 80 MG tablet; Take 1 tablet (80 mg total) by mouth every evening.  Dispense:  90 tablet; Refill: 1  6. Hives  - loratadine (CLARITIN) 10 MG tablet; Take 1 tablet (10 mg total) by mouth daily.  Dispense: 90 tablet; Refill: 1

## 2022-05-18 ENCOUNTER — Encounter: Payer: Self-pay | Admitting: Family Medicine

## 2022-05-18 ENCOUNTER — Ambulatory Visit (INDEPENDENT_AMBULATORY_CARE_PROVIDER_SITE_OTHER): Payer: Medicare Other | Admitting: Family Medicine

## 2022-05-18 VITALS — BP 126/72 | HR 92 | Temp 98.1°F | Resp 14 | Ht 59.5 in | Wt 140.7 lb

## 2022-05-18 DIAGNOSIS — L509 Urticaria, unspecified: Secondary | ICD-10-CM

## 2022-05-18 DIAGNOSIS — F325 Major depressive disorder, single episode, in full remission: Secondary | ICD-10-CM | POA: Diagnosis not present

## 2022-05-18 DIAGNOSIS — E1129 Type 2 diabetes mellitus with other diabetic kidney complication: Secondary | ICD-10-CM

## 2022-05-18 DIAGNOSIS — E785 Hyperlipidemia, unspecified: Secondary | ICD-10-CM

## 2022-05-18 DIAGNOSIS — R809 Proteinuria, unspecified: Secondary | ICD-10-CM | POA: Diagnosis not present

## 2022-05-18 DIAGNOSIS — I10 Essential (primary) hypertension: Secondary | ICD-10-CM

## 2022-05-18 DIAGNOSIS — E039 Hypothyroidism, unspecified: Secondary | ICD-10-CM

## 2022-05-18 LAB — POCT GLYCOSYLATED HEMOGLOBIN (HGB A1C): Hemoglobin A1C: 5.7 % — AB (ref 4.0–5.6)

## 2022-05-18 MED ORDER — LOSARTAN POTASSIUM 100 MG PO TABS: 100.0000 mg | ORAL_TABLET | Freq: Every day | ORAL | 1 refills | Status: DC

## 2022-05-18 MED ORDER — CITALOPRAM HYDROBROMIDE 40 MG PO TABS: 40.0000 mg | ORAL_TABLET | Freq: Every day | ORAL | 1 refills | Status: DC

## 2022-05-18 MED ORDER — LEVOTHYROXINE SODIUM 75 MCG PO TABS: 75.0000 ug | ORAL_TABLET | Freq: Every day | ORAL | 1 refills | Status: DC

## 2022-05-18 MED ORDER — LORATADINE 10 MG PO TABS: 10.0000 mg | ORAL_TABLET | Freq: Every day | ORAL | 1 refills | Status: DC

## 2022-05-18 MED ORDER — RYBELSUS 7 MG PO TABS: 1.0000 | ORAL_TABLET | Freq: Every day | ORAL | 1 refills | Status: DC

## 2022-05-18 MED ORDER — ATORVASTATIN CALCIUM 80 MG PO TABS: 80.0000 mg | ORAL_TABLET | Freq: Every evening | ORAL | 1 refills | Status: DC

## 2022-05-22 ENCOUNTER — Other Ambulatory Visit: Payer: Self-pay | Admitting: Family Medicine

## 2022-05-22 DIAGNOSIS — E039 Hypothyroidism, unspecified: Secondary | ICD-10-CM

## 2022-06-07 ENCOUNTER — Other Ambulatory Visit: Payer: Self-pay | Admitting: Family Medicine

## 2022-06-07 DIAGNOSIS — R809 Proteinuria, unspecified: Secondary | ICD-10-CM

## 2022-06-07 DIAGNOSIS — I10 Essential (primary) hypertension: Secondary | ICD-10-CM

## 2022-06-07 DIAGNOSIS — E785 Hyperlipidemia, unspecified: Secondary | ICD-10-CM

## 2022-06-11 ENCOUNTER — Telehealth: Payer: Self-pay | Admitting: Family Medicine

## 2022-06-11 NOTE — Telephone Encounter (Unsigned)
Copied from [place] [id number]. Topic: General - Other >> Jun 11, 2022  2:49 PM Everette C wrote: Reason for CRM: The patient has been directed to ask their doctor to contact [facility] [phone] to discuss their prescriptions and potentially receiving their medications at lower cost  The patient was told that additional information about their diagnoses would be needed  Please contact further when possible

## 2022-06-21 ENCOUNTER — Other Ambulatory Visit: Payer: Self-pay

## 2022-06-21 DIAGNOSIS — R809 Proteinuria, unspecified: Secondary | ICD-10-CM

## 2022-06-25 ENCOUNTER — Other Ambulatory Visit: Payer: Self-pay

## 2022-06-25 DIAGNOSIS — E1129 Type 2 diabetes mellitus with other diabetic kidney complication: Secondary | ICD-10-CM

## 2022-06-25 MED ORDER — ACCU-CHEK GUIDE VI STRP: ORAL_STRIP | 6 refills | Status: DC

## 2022-06-25 MED ORDER — ACCU-CHEK GUIDE ME W/DEVICE KIT: 1.0000 | PACK | Freq: Once | 0 refills | Status: DC

## 2022-06-25 MED ORDER — ALCOHOL PREP PADS: 1.0000 | MEDICATED_PAD | Freq: Every day | 3 refills | Status: DC

## 2022-06-25 MED ORDER — ACCU-CHEK SOFTCLIX LANCETS MISC: 12 refills | Status: DC

## 2022-07-20 ENCOUNTER — Other Ambulatory Visit: Payer: Self-pay | Admitting: Family Medicine

## 2022-07-20 DIAGNOSIS — E1129 Type 2 diabetes mellitus with other diabetic kidney complication: Secondary | ICD-10-CM

## 2022-07-20 DIAGNOSIS — E039 Hypothyroidism, unspecified: Secondary | ICD-10-CM

## 2022-07-20 DIAGNOSIS — E785 Hyperlipidemia, unspecified: Secondary | ICD-10-CM

## 2022-07-20 DIAGNOSIS — I10 Essential (primary) hypertension: Secondary | ICD-10-CM

## 2022-07-20 NOTE — Telephone Encounter (Signed)
Medication Refill - Medication:   Atorvastatin 80 mg  Losartan 100 mg Levothyroxine 75 mcg  Has the patient contacted their pharmacy? No.  She changed pharmacy   (APreferred Pharmacy (with phone number or street name): [place] Has the patient been seen for an appointment in the last year OR does the patient have an upcoming appointment? Yes.    Agent: Please be advised that RX refills may take up to 3 business days. We ask that you follow-up with your pharmacy.

## 2022-07-21 NOTE — Telephone Encounter (Signed)
Unable to refill per protocol, Rx request is too soon. Last refill 05/18/22 for 90 and 1 refill.  Requested Prescriptions  Pending Prescriptions Disp Refills   atorvastatin (LIPITOR) 80 MG tablet 90 tablet 1    Sig: Take 1 tablet (80 mg total) by mouth every evening.     Cardiovascular:  Antilipid - Statins Failed - 07/20/2022 11:54 AM      Failed - Lipid Panel in normal range within the last 12 months    Cholesterol  Date Value Ref Range Status  01/14/2022 108 <200 mg/dL Final   LDL Cholesterol (Calc)  Date Value Ref Range Status  01/14/2022 55 mg/dL (calc) Final    Comment:    Reference range: <100 . Desirable range <100 mg/dL for primary prevention;   <70 mg/dL for patients with CHD or diabetic patients  with > or = 2 CHD risk factors. Marland Kitchen LDL-C is now calculated using the Martin-Hopkins  calculation, which is a validated novel method providing  better accuracy than the Friedewald equation in the  estimation of LDL-C.  Cresenciano Genre et al. Annamaria Helling. [id number]): 2061-2068  (http://education.[facility].com/faq/FAQ164)    HDL  Date Value Ref Range Status  01/14/2022 34 (L) > OR = 50 mg/dL Final   Triglycerides  Date Value Ref Range Status  01/14/2022 106 <150 mg/dL Final         Passed - Patient is not pregnant      Passed - Valid encounter within last 12 months    Recent Outpatient Visits           2 months ago Controlled type 2 diabetes mellitus with microalbuminuria, without long-term current use of insulin [facility])   [facility] [name], MD   6 months ago Controlled type 2 diabetes mellitus with microalbuminuria, without long-term current use of insulin [facility])   [facility] [name], MD   1 year ago Controlled type 2 diabetes mellitus with microalbuminuria, without long-term current use of insulin [facility])   [facility] [name], MD   1 year ago Controlled type 2  diabetes mellitus with microalbuminuria, without long-term current use of insulin [facility])   [facility] [name], MD   1 year ago Controlled type 2 diabetes mellitus with microalbuminuria, without long-term current use of insulin [facility])   [facility] [place], Drue Stager, MD               levothyroxine (SYNTHROID) 75 MCG tablet 100 tablet 1    Sig: Take 1 tablet (75 mcg total) by mouth daily before breakfast.     Endocrinology:  Hypothyroid Agents Passed - 07/20/2022 11:54 AM      Passed - TSH in normal range and within 360 days    TSH  Date Value Ref Range Status  01/14/2022 0.94 0.40 - 4.50 mIU/L Final         Passed - Valid encounter within last 12 months    Recent Outpatient Visits           2 months ago Controlled type 2 diabetes mellitus with microalbuminuria, without long-term current use of insulin [facility])   [facility] [place], Drue Stager, MD   6 months ago Controlled type 2 diabetes mellitus with microalbuminuria, without long-term current use of insulin [facility])   [facility] [place], Drue Stager, MD   1 year ago Controlled type 2 diabetes mellitus with microalbuminuria, without long-term current  use of insulin [facility])   [facility] [place], Drue Stager, MD   1 year ago Controlled type 2 diabetes mellitus with microalbuminuria, without long-term current use of insulin [facility])   [facility] [name], MD   1 year ago Controlled type 2 diabetes mellitus with microalbuminuria, without long-term current use of insulin [facility])   [facility] [place], Drue Stager, MD               losartan (COZAAR) 100 MG tablet 90 tablet 1    Sig: Take 1 tablet (100 mg total) by mouth daily.     Cardiovascular:  Angiotensin Receptor Blockers Failed - 07/20/2022 11:54 AM      Failed - Cr in normal range and within 180 days     Creat  Date Value Ref Range Status  01/14/2022 1.14 (H) 0.60 - 1.00 mg/dL Final   Creatinine, Urine  Date Value Ref Range Status  01/14/2022 103 20 - 275 mg/dL Final         Failed - K in normal range and within 180 days    Potassium  Date Value Ref Range Status  01/14/2022 4.5 3.5 - 5.3 mmol/L Final  01/01/2014 3.7 3.5 - 5.1 mmol/L Final         Passed - Patient is not pregnant      Passed - Last BP in normal range    BP Readings from Last 1 Encounters:  05/18/22 126/72         Passed - Valid encounter within last 6 months    Recent Outpatient Visits           2 months ago Controlled type 2 diabetes mellitus with microalbuminuria, without long-term current use of insulin [facility])   [facility] [name], MD   6 months ago Controlled type 2 diabetes mellitus with microalbuminuria, without long-term current use of insulin [facility])   [facility] [place], Drue Stager, MD   1 year ago Controlled type 2 diabetes mellitus with microalbuminuria, without long-term current use of insulin [facility]   [facility] [place], Drue Stager, MD   1 year ago Controlled type 2 diabetes mellitus with microalbuminuria, without long-term current use of insulin [facility])   [facility] [place], Drue Stager, MD   1 year ago Controlled type 2 diabetes mellitus with microalbuminuria, without long-term current use of insulin [facility])   [facility] [name], MD

## 2022-07-22 NOTE — Telephone Encounter (Signed)
Pt called to check the status of her medication refill. Advise pt that it is too soon for her medication refill. Pt states that she does have 20 pills and she will wait.

## 2022-07-28 ENCOUNTER — Encounter: Payer: Self-pay | Admitting: Family Medicine

## 2022-08-02 DIAGNOSIS — E119 Type 2 diabetes mellitus without complications: Secondary | ICD-10-CM | POA: Diagnosis not present

## 2022-08-02 DIAGNOSIS — H5789 Other specified disorders of eye and adnexa: Secondary | ICD-10-CM | POA: Diagnosis not present

## 2022-08-02 DIAGNOSIS — H43813 Vitreous degeneration, bilateral: Secondary | ICD-10-CM | POA: Diagnosis not present

## 2022-08-02 DIAGNOSIS — H2513 Age-related nuclear cataract, bilateral: Secondary | ICD-10-CM | POA: Diagnosis not present

## 2022-08-02 LAB — HM DIABETES EYE EXAM

## 2022-08-18 NOTE — Telephone Encounter (Unsigned)
Copied from [place] [id number]. Topic: Appointment Scheduling - Scheduling Inquiry for Clinic >> Aug 18, 2022  2:52 PM Megan Short wrote: Reason for CRM: Patient called in wants to know if Dr Ancil Boozer wants her to [facility] an appt ot have her BP checked? Please call back

## 2022-09-20 ENCOUNTER — Other Ambulatory Visit: Payer: Self-pay

## 2022-09-20 DIAGNOSIS — E1129 Type 2 diabetes mellitus with other diabetic kidney complication: Secondary | ICD-10-CM

## 2022-09-20 MED ORDER — ACCU-CHEK GUIDE VI STRP: ORAL_STRIP | 6 refills | Status: DC

## 2022-09-20 MED ORDER — ACCU-CHEK GUIDE W/DEVICE KIT: 1.0000 | PACK | Freq: Once | 0 refills | Status: DC

## 2022-09-20 MED ORDER — ACCU-CHEK SOFTCLIX LANCETS MISC: 12 refills | Status: AC

## 2022-09-20 MED ORDER — ALCOHOL PREP PADS: 1.0000 | MEDICATED_PAD | Freq: Every day | 3 refills | Status: DC

## 2022-09-20 MED ORDER — ACCU-CHEK GUIDE CONTROL VI LIQD: 1.0000 | Freq: Once | 3 refills | Status: AC

## 2022-09-28 NOTE — Progress Notes (Signed)
Name: Megan Short   MRN: [id number]    DOB: 29-Nov-1945   Date:09/29/2022       Progress Note  Subjective  Chief Complaint  Follow Up  HPI  HTN: taking medication - Losartan  daily  and denies side effects. No chest pain, no palpitation or dizziness. Last urine micro was elevated, continue ARB  Rash she has a long history of rash on legs, it was getting worse and we referred her to Dermatologist, she saw Dr. Roseanne Reno , not itchy, she was diagnosed with eczema was given creams. She also has hyperpigmentation on her face and uses medication prn   Vitamin B12 deficiency: she was taking Metformin and is taking B12 SL, she is feeling well, no paresthesia. No fatigue.   Hypothyroidism: she denies hair loss, but she has dry skin,  dysphagia or change in bowel movements . We will recheck TSH now  CKI stage III: last GFR is 51 , advised to NSAID's , continue ARB and we will monitor, if it continues to drop we will add SGL-2 agonist Urine micro elevated at 50 She does not want to start SGL-2 agonist due to cost   DMII:  She is exercising again, eating smaller portions, she is on higher dose of Metformin at 1500 mg  A1C has gone up from 6.5 % to 7.5 % ,7.4 % 6.7 % up to 7%, 6.6 % we switched to Rybelsus and initially A1C went down to 5.7% but she wants to resume Metformin due to cost of medication. She has also been eating more chocolate and A1C is up to 6.6 % . She denies polyphagia, polydipsia, or polyuria.. She is taking ARB for CKI and  microalbuminuria . She was seen by  Nephrologist - Dr. Thedore Mins , Renal Hyperparathyroidism resolved.    Major Depression: it was caused by death of her son 15 years ago ( complications of lupus died at age 71) , she is doing well on Celexa and BZD's very seldom - last rx was given 07/2021 and she has 10 left  . She denies anhedonia, she has been going to the gym multiple times a week, denies crying spells,  still has difficulty making decisions ( like picking out an outfit)  but not about finances. Her mother died 02/22/20. She has noticed that she has been thinking more about her son lately. Also worries about wars and people suffering. Explained possibly due to seasonal changes    Dyslipidemia: on Atorvastatin,denies myalgias, doing well on medication. 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/07/2014   Calculus of kidney 09/07/2014   Memory loss or impairment 09/07/2014    Past Surgical History:  Procedure Laterality Date   BREAST BIOPSY Right    negative times 2   COLONOSCOPY WITH PROPOFOL N/A 01/20/2015   Procedure: COLONOSCOPY WITH PROPOFOL;  Surgeon: Scot Jun, MD;  Location: [facility] ENDOSCOPY;  Service: Endoscopy;  Laterality: N/A;  COLONOSCOPY WITH PROPOFOL N/A 05/07/2020   Procedure: COLONOSCOPY WITH PROPOFOL;  Surgeon: Toledo, Boykin Nearing, MD;  Location: [facility] ENDOSCOPY;  Service: Gastroenterology;  Laterality: N/A;   LITHOTRIPSY Left 12/2013    Family History  Problem Relation Age of Onset   Osteoarthritis Mother    Kidney failure Mother    Failure to thrive Mother    Osteoarthritis Sister    Alzheimer's disease Maternal Aunt    Breast cancer Neg Hx     Social History   Tobacco Use   Smoking status: Never   Smokeless tobacco: Never   Tobacco comments:    Smoking cessation materials not required  Substance Use Topics   Alcohol use:  No    Alcohol/week: 0.0 standard drinks of alcohol     Current Outpatient Medications:    Accu-Chek Softclix Lancets lancets, Use as instructed, Disp: 100 each, Rfl: 12   Alcohol Swabs (ALCOHOL PREP) PADS, 1 each by Does not apply route daily., Disp: 100 each, Rfl: 3   atorvastatin (LIPITOR) 80 MG tablet, Take 1 tablet (80 mg total) by mouth every evening., Disp: 90 tablet, Rfl: 1   blood glucose meter kit and supplies, Dispense based on patient and insurance preference. Check bid daily as directed. (FOR ICD-10 E10.9, E11.9)., Disp: 1 each, Rfl: 0   cholecalciferol (VITAMIN D3) 25 MCG (1000 UNIT) tablet, Take 1 tablet (1,000 Units total) by mouth daily., Disp: 90 tablet, Rfl: 1   clonazePAM (KLONOPIN) 0.5 MG tablet, Take 1 tablet by mouth twice daily as needed, Disp: 30 tablet, Rfl: 0   glucose blood (ACCU-CHEK GUIDE) test strip, USE 1 TEST STRIP TWICE DAILY AS  NEEDED, Disp: 100 strip, Rfl: 6   levothyroxine (SYNTHROID) 75 MCG tablet, Take 1 tablet (75 mcg total) by mouth daily before breakfast., Disp: 100 tablet, Rfl: 1   loratadine (CLARITIN) 10 MG tablet, Take 1 tablet (10 mg total) by mouth daily., Disp: 90 tablet, Rfl: 1   losartan (COZAAR) 100 MG tablet, Take 1 tablet (100 mg total) by mouth daily., Disp: 90 tablet, Rfl: 1   metFORMIN (GLUCOPHAGE-XR) 750 MG 24 hr tablet, Take 1 tablet (750 mg total) by mouth daily with breakfast., Disp: 180 tablet, Rfl: 0   tacrolimus (PROTOPIC) 0.1 % ointment, Apply to affected areas once to twice daily as directed., Disp: 100 g, Rfl: 1   Blood Glucose Calibration (ACCU-CHEK GUIDE CONTROL) LIQD, 1 each by In Vitro route once for 1 dose., Disp: 1 each, Rfl: 3   Blood Glucose Monitoring Suppl (ACCU-CHEK GUIDE) w/Device KIT, 1 each by Does not apply route once for 1 dose., Disp: 1 kit, Rfl: 0   citalopram (CELEXA) 40 MG tablet, Take 1 tablet (40 mg total) by mouth daily., Disp: 90 tablet, Rfl: 1   hydroquinone 4 % cream, APPLY  CREAM TOPICALLY TO DARK SPOTS  ON LEGS TWICE DAILY TO HELP FADE (Patient not taking: Reported on 09/29/2022), Disp: 29 g, Rfl: 0  Current Facility-Administered Medications:    cyanocobalamin (VITAMIN B12) injection 1,000 mcg, 1,000 mcg, Intramuscular, Once, Alba Cory, MD  Allergies  Allergen Reactions   Sulfa Antibiotics     I personally reviewed active problem list, medication list, allergies, family history, social history, health maintenance with the patient/caregiver today.   ROS  Ten systems reviewed and is negative except as mentioned in HPI    Objective  Vitals:   09/29/22 1442  BP: 130/72  Pulse: 97  Resp: 16  Temp: 97.9 F (36.6 C)  TempSrc: Oral  SpO2: 97%  Weight: 147 lb 6.4 oz (66.9 kg)  Height: 4' 11.5" (1.511 m)    Body mass index is 29.27 kg/m.  Physical Exam  Constitutional: Patient appears well-developed and well-nourished.  No distress.  HEENT: head atraumatic, normocephalic, pupils equal and reactive to light, neck supple Cardiovascular: Normal rate, regular rhythm and normal heart sounds.  No murmur heard. No BLE edema. Pulmonary/Chest: Effort normal and breath sounds normal. No respiratory distress. Abdominal: Soft.  There is no tenderness. Psychiatric: Patient has a normal mood and affect. behavior is normal. Judgment and thought content normal.   Recent Results (from the past 2160 hour(s))  HM DIABETES EYE EXAM     Status: None   Collection Time: 08/02/22 12:00 AM  Result Value Ref Range   HM Diabetic Eye Exam No Retinopathy No Retinopathy  POCT HgB A1C     Status: Abnormal   Collection Time: 09/29/22  2:47 PM  Result Value Ref Range   Hemoglobin A1C 6.6 (A) 4.0 - 5.6 %   HbA1c POC (<> result, manual entry)     HbA1c, POC (prediabetic range)     HbA1c, POC (controlled diabetic range)      PHQ2/9:    09/29/2022    2:32 PM 05/18/2022    9:29 AM 04/22/2022    1:56 PM 01/14/2022    9:59 AM 07/17/2021    9:56 AM  Depression screen PHQ 2/9  Decreased Interest 0  0 0 0 0  Down, Depressed, Hopeless 0 0 0 0 1  PHQ - 2 Score 0 0 0 0 1  Altered sleeping 0 0  0 1  Tired, decreased energy 0 0  0 0  Change in appetite 0 0  0 0  Feeling bad or failure about yourself  0 0  0 0  Trouble concentrating 0 0  0 0  Moving slowly or fidgety/restless 0 0  0 0  Suicidal thoughts 0 0  0 0  PHQ-9 Score 0 0  0 2  Difficult doing work/chores Not difficult at all        phq 9 is negative   Fall Risk:    09/29/2022    2:31 PM 05/18/2022    9:29 AM 04/22/2022    1:56 PM 01/14/2022    9:59 AM 07/17/2021    9:56 AM  Fall Risk   Falls in the past year? 0 0 0 0 0  Number falls in past yr: 0  0 0 0  Injury with Fall? 0  0 0 0  Risk for fall due to : No Fall Risks No Fall Risks No Fall Risks No Fall Risks No Fall Risks  Follow up Falls prevention discussed;Education provided;Falls evaluation completed Falls prevention discussed;Education provided;Falls evaluation completed Falls evaluation completed Falls prevention discussed Falls prevention discussed      Functional Status Survey: Is the patient deaf or have difficulty hearing?: No Does the patient have difficulty seeing, even when wearing glasses/contacts?: No Does the patient have difficulty concentrating, remembering, or making decisions?: No Does the patient have difficulty walking or climbing stairs?: No Does the patient have difficulty dressing or bathing?: No Does the patient have difficulty doing errands alone such as visiting a doctor's office or shopping?: No    Assessment & Plan  1. Controlled type 2 diabetes mellitus with microalbuminuria, without long-term current use of insulin  - POCT HgB A1C  2. Major depression in remission  - citalopram (CELEXA) 40 MG tablet; Take 1 tablet (40  mg total) by mouth daily.  Dispense: 90 tablet; Refill: 1  3. Vitamin D deficiency  Continue vitamin D supplementation   4. Dyslipidemia  On statin therapy   5. Essential hypertension  Continue therapy    6. Adult hypothyroidism  - TSH  7. Stage 3a chronic kidney disease  - BASIC METABOLIC PANEL WITH GFR  8. B12 deficiency  - B12 and Folate Panel - cyanocobalamin (VITAMIN B12) injection 1,000 mcg

## 2022-09-29 ENCOUNTER — Encounter: Payer: Self-pay | Admitting: Family Medicine

## 2022-09-29 ENCOUNTER — Ambulatory Visit (INDEPENDENT_AMBULATORY_CARE_PROVIDER_SITE_OTHER): Payer: Medicare HMO | Admitting: Family Medicine

## 2022-09-29 VITALS — BP 130/72 | HR 97 | Temp 97.9°F | Resp 16 | Ht 59.5 in | Wt 147.4 lb

## 2022-09-29 DIAGNOSIS — E039 Hypothyroidism, unspecified: Secondary | ICD-10-CM

## 2022-09-29 DIAGNOSIS — N1831 Chronic kidney disease, stage 3a: Secondary | ICD-10-CM | POA: Diagnosis not present

## 2022-09-29 DIAGNOSIS — E1129 Type 2 diabetes mellitus with other diabetic kidney complication: Secondary | ICD-10-CM | POA: Diagnosis not present

## 2022-09-29 DIAGNOSIS — I10 Essential (primary) hypertension: Secondary | ICD-10-CM

## 2022-09-29 DIAGNOSIS — E785 Hyperlipidemia, unspecified: Secondary | ICD-10-CM

## 2022-09-29 DIAGNOSIS — R809 Proteinuria, unspecified: Secondary | ICD-10-CM

## 2022-09-29 DIAGNOSIS — Z7984 Long term (current) use of oral hypoglycemic drugs: Secondary | ICD-10-CM

## 2022-09-29 DIAGNOSIS — E538 Deficiency of other specified B group vitamins: Secondary | ICD-10-CM

## 2022-09-29 DIAGNOSIS — F325 Major depressive disorder, single episode, in full remission: Secondary | ICD-10-CM | POA: Diagnosis not present

## 2022-09-29 DIAGNOSIS — E559 Vitamin D deficiency, unspecified: Secondary | ICD-10-CM

## 2022-09-29 LAB — POCT GLYCOSYLATED HEMOGLOBIN (HGB A1C): Hemoglobin A1C: 6.6 % — AB (ref 4.0–5.6)

## 2022-09-29 MED ORDER — CITALOPRAM HYDROBROMIDE 40 MG PO TABS: 40.0000 mg | ORAL_TABLET | Freq: Every day | ORAL | 1 refills | Status: DC

## 2022-09-29 MED ORDER — METFORMIN HCL ER 750 MG PO TB24: 750.0000 mg | ORAL_TABLET | Freq: Every day | ORAL | 0 refills | Status: DC

## 2022-09-29 MED ORDER — CYANOCOBALAMIN 1000 MCG/ML IJ SOLN
1000.0000 ug | Freq: Once | INTRAMUSCULAR | Status: AC
Start: 2022-09-29 — End: 2022-09-29
  Administered 2022-09-29: 1000 ug via INTRAMUSCULAR

## 2022-09-30 LAB — BASIC METABOLIC PANEL WITH GFR
BUN/Creatinine Ratio: 15 (calc) (ref 6–22)
BUN: 18 mg/dL (ref 7–25)
CO2: 25 mmol/L (ref 20–32)
Calcium: 10.3 mg/dL (ref 8.6–10.4)
Chloride: 104 mmol/L (ref 98–110)
Creat: 1.24 mg/dL — ABNORMAL HIGH (ref 0.60–1.00)
Glucose, Bld: 80 mg/dL (ref 65–99)
Potassium: 4.1 mmol/L (ref 3.5–5.3)
Sodium: 139 mmol/L (ref 135–146)
eGFR: 45 mL/min/{1.73_m2} — ABNORMAL LOW (ref >=60)

## 2022-09-30 LAB — TSH: TSH: 0.43 mIU/L (ref 0.40–4.50)

## 2022-09-30 LAB — B12 AND FOLATE PANEL
Folate: 14.6 ng/mL
Vitamin B-12: 1102 pg/mL — ABNORMAL HIGH (ref 200–1100)

## 2022-11-02 ENCOUNTER — Other Ambulatory Visit: Payer: Self-pay | Admitting: Family Medicine

## 2022-11-02 DIAGNOSIS — Z1231 Encounter for screening mammogram for malignant neoplasm of breast: Secondary | ICD-10-CM

## 2022-11-24 ENCOUNTER — Other Ambulatory Visit: Payer: Self-pay | Admitting: Family Medicine

## 2022-11-24 DIAGNOSIS — F325 Major depressive disorder, single episode, in full remission: Secondary | ICD-10-CM

## 2022-11-24 MED ORDER — CLONAZEPAM 0.5 MG PO TABS
0.5000 mg | ORAL_TABLET | Freq: Two times a day (BID) | ORAL | 0 refills | Status: DC | PRN
Start: 2022-11-24 — End: 2023-10-13

## 2022-11-30 ENCOUNTER — Encounter: Payer: Self-pay | Admitting: Family Medicine

## 2022-12-01 ENCOUNTER — Other Ambulatory Visit: Payer: Self-pay | Admitting: Family Medicine

## 2022-12-01 MED ORDER — PIOGLITAZONE HCL 15 MG PO TABS: 15.0000 mg | ORAL_TABLET | Freq: Every day | ORAL | 0 refills | Status: DC

## 2022-12-15 ENCOUNTER — Ambulatory Visit: Payer: Medicare HMO | Admitting: Family Medicine

## 2022-12-20 ENCOUNTER — Ambulatory Visit
Admission: RE | Admit: 2022-12-20 | Discharge: 2022-12-20 | Disposition: A | Discharge status: Home / Self Care | Payer: Medicare HMO | Source: Ambulatory Visit | Attending: Family Medicine | Admitting: Family Medicine

## 2022-12-20 DIAGNOSIS — Z1231 Encounter for screening mammogram for malignant neoplasm of breast: Secondary | ICD-10-CM | POA: Insufficient documentation

## 2022-12-27 ENCOUNTER — Encounter: Payer: Self-pay | Admitting: Pharmacist

## 2022-12-27 NOTE — Progress Notes (Unsigned)
   12/27/2022  Patient ID: Megan Short, female   DOB: Oct 31, 1945, 77 y.o.   MRN: [id number]  Pharmacy Quality Measure Review  This patient is appearing on a report for being at risk of failing the adherence measure for diabetes medications this calendar year.   Medication: Rybelsus 7 mg Last fill date: 08/28/2022 for 30 day supply  From review of chart, per Office Visit note from PCP from 09/29/2022, patient requested to discontinue Rybelsus due to the cost of the medication.  Outreach to patient by telephone today.   Diabetes:  Current medications: metformin ER 750 mg twice daily Medications tried in the past: pioglitazone (reports only took for 2 days; stopped after reading about increased risk of heart failure); Rybelsus (cost)  Current glucose readings: morning fasting ranging: 129-187  Current meal patterns:  - Breakfast: slice of wheat bread, egg and slice of ham - Lunch: Dannon Austria light fit yogurt, - Supper: cabbage and squash and cornbread - Snacks: peanuts and small pack of crackers or bag of popcorn - Drinks: water and coffee sometimes with sugar with breakfast  Reports has worked on cutting back the sweets from her diet  Current physical activity: Goes to gym (treadmill, aerobics and weights) at least 90 minutes Monday-Friday  Current medication access support: none - Based on reported income, patient does not qualify for Extra Help subsidy through [employer]  Lab Results  Component Value Date   NA 139 09/29/2022   CL 104 09/29/2022   K 4.1 09/29/2022   CO2 25 09/29/2022   BUN 18 09/29/2022   CREATININE 1.24 (H) 09/29/2022   EGFR 45 (L) 09/29/2022   CALCIUM 10.3 09/29/2022   ALBUMIN 4.2 12/14/2016   GLUCOSE 80 09/29/2022   Lab Results  Component Value Date   HGBA1C 6.6 (A) 09/29/2022    Assessment/Plan:  Diabetes: - Reviewed goal A1c, goal fasting, and goal 2 hour post prandial glucose - Reviewed dietary modifications including encourage  patient to have regular well-balanced meals while controlling carbohydrate portion sizes  Encourage patient to review nutrition labels for total carbohydrate content of foods - Recommend to continue to check glucose, keep log of results and have this record to review during upcoming medical appointments - Meets financial criteria for Rybelsus patient assistance program through [employer]  Patient interested in restarting Rybelsus therapy if PCP agrees Will collaborate with PCP regarding patient's recent home blood sugar readings and her diabetes medication management  Follow Up Plan: Will outreach to patient by telephone again within the next 7 days.  Estelle Grumbles, PharmD, [facility] [phone]

## 2022-12-29 NOTE — Patient Instructions (Signed)
Goals Addressed             This Visit's Progress    Pharmacy Goals       Our goal A1c is less than 7%. This corresponds with fasting sugars less than 130 and 2 hour after meal sugars less than 180. Please keep a log of your results when checking your blood sugar   Our goal bad cholesterol, or LDL, is less than 70. This is why it is important to continue taking your atorvastatin.  Please watch the mail for an envelope from [employer] containing the patient assistance program application. Please complete this application and mail back to [facility] Pharmacy Technician Noreene Larsson [place] along with a copy of your Medicare Part D prescription card and a copy of your proof of income document.  Thank you!  Estelle Grumbles, PharmD, [facility] [phone]

## 2022-12-30 ENCOUNTER — Other Ambulatory Visit: Payer: Self-pay | Admitting: Family Medicine

## 2022-12-30 ENCOUNTER — Telehealth: Payer: Self-pay | Admitting: Pharmacy Technician

## 2022-12-30 DIAGNOSIS — Z5986 Financial insecurity: Secondary | ICD-10-CM

## 2022-12-30 NOTE — Progress Notes (Signed)
[facility] Customer service manager [facility])                                            [facility] Quality Pharmacy Team    12/30/2022  Lynnea Vandervoort 04-27-1946 [id number]                                      Medication Assistance Referral  Referral From:  [facility] PharmD Estelle Grumbles  Medication/Company: Rybelsus / [employer] Patient application portion:  Mailed Provider application portion: Faxed  to Dr. Alba Cory Provider address/fax verified via: Office website  [name], CPhT [place]  [facility] Office: [phone] Fax: [phone] Email: [email or web address]

## 2023-01-13 ENCOUNTER — Telehealth: Payer: Self-pay | Admitting: Pharmacy Technician

## 2023-01-13 DIAGNOSIS — Z5986 Financial insecurity: Secondary | ICD-10-CM

## 2023-01-13 NOTE — Progress Notes (Signed)
[facility])                                            [facility] Quality Pharmacy Team    01/13/2023  Logynn Peshek 02/14/1946 [id number]  Received both patient and provider portion(s) of patient assistance application(s) for Rybelsus. Faxed completed application and required documents into [employer].    [name], CPhT   [facility] Office: [phone] Fax: [phone] Email: [email or web address]

## 2023-01-19 ENCOUNTER — Telehealth: Payer: Self-pay | Admitting: Pharmacy Technician

## 2023-01-19 DIAGNOSIS — Z5986 Financial insecurity: Secondary | ICD-10-CM

## 2023-01-19 NOTE — Progress Notes (Signed)
[facility])                                            [facility] Quality Pharmacy Team    01/19/2023  Megan Short 21-Oct-1945 [id number]  Care coordination call placed to [employer] in regard to Rybelsus application.  Spoke to [place] who informs patient is APPROVED 01/13/23-06/07/23. Initial and subsequent shipments will be processed automatically and delivered to the prescriber's office. If patient feels as though current supply is not sufficient until next supply should arrive, patient may call [employer] at [phone].  [name], CPhT [place]  [facility] Office: [phone] Fax: [phone] Email: [email or web address]

## 2023-01-20 ENCOUNTER — Other Ambulatory Visit: Payer: Self-pay | Admitting: Family Medicine

## 2023-01-20 DIAGNOSIS — E039 Hypothyroidism, unspecified: Secondary | ICD-10-CM

## 2023-01-24 ENCOUNTER — Telehealth: Payer: Self-pay | Admitting: Family Medicine

## 2023-01-24 ENCOUNTER — Other Ambulatory Visit: Payer: Medicare HMO | Admitting: Pharmacist

## 2023-01-24 NOTE — Telephone Encounter (Signed)
Pt is calling to ask is her Semaglutide (RYBELSUS) 3 MG TABS [[id number]] in the office? So she can receive injection this week during her OV. Please advise CB [phone]

## 2023-01-24 NOTE — Progress Notes (Signed)
01/24/2023 Name: Megan Short MRN: [id number] DOB: [birth year]  Chief Complaint  Patient presents with   Medication Management   Medication Assistance    Brianah Bava is a 77 y.o. year old female who presented for a telephone visit.   They were self-referred to the pharmacist for assistance in managing medication access.    Subjective:  Care Team: Primary Care Provider: Alba Cory, MD ; Next Scheduled Visit: 01/26/2023   Medication Access/Adherence  Current Pharmacy:  [facility] [address]), [place] - [address] [place] ([place]) [place] [address] Phone: [phone] Fax: [phone]  [facility] [phone] - Closed - [place], [place] - [address] [place] [address] Phone: [phone] Fax: [phone]  [facility] [place], [place] - [address] [place] [address] Phone: [phone] Fax: [phone]  [facility] - [place], [place] - [address] [place] [address] Phone: [phone] Fax: [phone]   Patient reports affordability concerns with their medications: No  Patient reports access/transportation concerns to their pharmacy: No  Patient reports adherence concerns with their medications:  No     Diabetes:   Current medications:  - pioglitazone 15 mg daily - Rybelsus 3 mg daily (from sample; restarted ~ 1 month ago) Confirms takes on empty stomach, ?30 minutes before the first food, beverage, or other oral medications of the day with ?4 oz of plain water only Reports tolerating well. Reports planning to increase to Rybelsus 7 mg daily as planned with supply from patient assistance program  Medications tried in the past: metformin ER (reports stopped when started pioglitazone)   Current glucose readings: morning fasting ranging: 137-165; today: 140 - Has noticed impact of snacking overnight on  watermelon, cantaloupe or popcorn on morning blood sugar readings   Current physical activity: Goes to gym (treadmill, aerobics and weights) at least 90 minutes Monday-Friday  Statin therapy: atorvastatin 80 mg daily  Current medication access support: enrolled in patient assistance for Rybelsus from [employer] through 06/07/2023   Objective:  Lab Results  Component Value Date   HGBA1C 6.6 (A) 09/29/2022    Lab Results  Component Value Date   CREATININE 1.24 (H) 09/29/2022   BUN 18 09/29/2022   NA 139 09/29/2022   K 4.1 09/29/2022   CL 104 09/29/2022   CO2 25 09/29/2022    Lab Results  Component Value Date   CHOL 108 01/14/2022   HDL 34 (L) 01/14/2022   LDLCALC 55 01/14/2022   TRIG 106 01/14/2022   CHOLHDL 3.2 01/14/2022    Medications Reviewed Today     Reviewed by Manuela Neptune, RPH-CPP (Pharmacist) on 01/24/23 at 1412  Med List Status: <None>   Medication Order Taking? Sig Documenting Provider Last Dose Status Informant  Accu-Chek Softclix Lancets lancets [id number]  Use as instructed Alba Cory, MD  Active   Alcohol Swabs (ALCOHOL PREP) PADS [id number]  1 each by Does not apply route daily. Alba Cory, MD  Active   atorvastatin (LIPITOR) 80 MG tablet [id number]  Take 1 tablet (80 mg total) by mouth every evening. Alba Cory, MD  Active   Blood Glucose Calibration (ACCU-CHEK GUIDE CONTROL) LIQD [id number]  1 each by In Vitro route once for 1 dose. Alba Cory, MD  Expired 09/20/22 2359   blood glucose meter kit and supplies [id number]  Dispense based on patient  and insurance preference. Check bid daily as directed. (FOR ICD-10 E10.9, E11.9). Alba Cory, MD  Active   Blood Glucose Monitoring Suppl (ACCU-CHEK GUIDE) w/Device KIT [id number]  1 each by Does not apply route once for 1 dose. Alba Cory, MD  Expired 09/20/22 2359   cholecalciferol (VITAMIN D3) 25 MCG (1000 UNIT) tablet [id number]  Take 1 tablet (1,000 Units total) by mouth  daily. Alba Cory, MD  Active   citalopram (CELEXA) 40 MG tablet [id number]  Take 1 tablet (40 mg total) by mouth daily. Alba Cory, MD  Active   clonazePAM Scarlette Calico) 0.5 MG tablet [id number]  Take 1 tablet (0.5 mg total) by mouth 2 (two) times daily as needed. Alba Cory, MD  Active   glucose blood (ACCU-CHEK GUIDE) test strip [id number]  USE 1 TEST STRIP TWICE DAILY AS  NEEDED Alba Cory, MD  Active   hydroquinone 4 % cream [id number]  APPLY  CREAM TOPICALLY TO DARK SPOTS ON LEGS TWICE DAILY TO HELP FADE  Patient not taking: Reported on 09/29/2022   Willeen Niece, MD  Active   levothyroxine (SYNTHROID) 75 MCG tablet [id number]  TAKE 1 TABLET BY MOUTH ONCE DAILY BEFORE Iona Coach, Danna Hefty, MD  Active   loratadine (CLARITIN) 10 MG tablet [id number]  Take 1 tablet (10 mg total) by mouth daily. Alba Cory, MD  Active   losartan (COZAAR) 100 MG tablet [id number]  Take 1 tablet (100 mg total) by mouth daily. Alba Cory, MD  Active   metFORMIN (GLUCOPHAGE-XR) 750 MG 24 hr tablet [id number] No Take 1 tablet (750 mg total) by mouth daily with breakfast.  Patient not taking: Reported on 01/24/2023   Alba Cory, MD Not Taking Active   pioglitazone (ACTOS) 15 MG tablet [id number] Yes Take 1 tablet by mouth once daily Alba Cory, MD Taking Active   Semaglutide (RYBELSUS) 3 MG TABS [id number] Yes Take 1 tablet by mouth daily. Take on empty stomach, 30 minutes before the first food, beverage, or other oral medications of the day with 4 oz of plain water only [provider] Taking Active   tacrolimus (PROTOPIC) 0.1 % ointment [id number]  Apply to affected areas once to twice daily as directed. Willeen Niece, MD  Active               Assessment/Plan:   Diabetes: - Reviewed goal A1c, goal fasting, and goal 2 hour post prandial glucose - Reviewed dietary modifications including encourage patient to have regular well-balanced meals while controlling carbohydrate  portion sizes             Encourage patient to review nutrition labels for total carbohydrate content of foods  Discuss ideas for well-balanced snacks - Recommend to continue to check glucose, keep log of results and have this record to review during upcoming medical appointments - Collaborate with PCP regarding medication management    Follow Up Plan: Clinical Pharmacist will outreach to patient by telephone again on 03/02/2023 at 2:30 pm   Estelle Grumbles, PharmD, [facility] [phone]

## 2023-01-24 NOTE — Patient Instructions (Addendum)
Goals Addressed             This Visit's Progress    Pharmacy Goals       Our goal A1c is less than 7%. This corresponds with fasting sugars less than 130 and 2 hour after meal sugars less than 180. Please keep a log of your results when checking your blood sugar   Our goal bad cholesterol, or LDL, is less than 70. This is why it is important to continue taking your atorvastatin.   Thank you!  Estelle Grumbles, PharmD, [facility] [phone]

## 2023-01-25 NOTE — Progress Notes (Unsigned)
Name: Megan Short   MRN: [id number]    DOB: 03-Aug-1945   Date:01/26/2023       Progress Note  Subjective  Chief Complaint  Follow Up  HPI  HTN: taking medication - Losartan  daily  and denies side effects. No chest pain, no palpitation or dizziness. Last urine micro was elevated, we will recheck it today.   Rash she has a long history of rash on legs, it was getting worse and we referred her to Dermatologist, she saw Dr. Roseanne Reno , not itchy, she was diagnosed with eczema was given creams. She also has hyperpigmentation on her face and uses medication prn . Stable   Vitamin B12 deficiency: she was taking Metformin and is taking B12 SL, she is feeling well, no paresthesia or fatigue. We will recheck labs today    Hypothyroidism: she denies hair loss, but she has dry skin,  dysphagia or change in bowel movements . Last TSH was at goal. Continue 75 mcg daily   CKI stage III: last GFR is 51 , advised to NSAID's , continue ARB and we will monitor, if it continues to drop we will add SGL-2 agonist Urine micro elevated at 50, we will recheck labs today and if needed order medication through assistance program   DMII:  she is frustrated about her weight gain, she stopped Metformin due to feeling it was no longer working and has been taking Actos and 3 mg of Rybelsus ( getting it through assistance program ) she has gained weight since. Explained that Metformin is weight neutral or causes weight loss and Actos causes weight gain. We will resume Metformin at 1500 mg for now and continue Rybelsus ( still on 3 mg dose and waiting for 7 mg dose). . She denies polyphagia, polydipsia, or polyuria.. She is taking ARB for CKI and  microalbuminuria . She was seen by  Nephrologist - Dr. Thedore Mins , Renal Hyperparathyroidism resolved. A1C today was 7.4 %.    Major Depression: it was caused by death of her son 15 years ago ( complications of lupus died at age 75) , she is doing well on Celexa and BZD's very seldom - last  rx was given 07/2021 and she has 10 left  . She denies anhedonia, she has been going to the gym multiple times a week, denies crying spells,  still has difficulty making decisions ( like picking out an outfit) but not about finances. Her mother died 2020/02/24. She is feeling better.    Dyslipidemia: on Atorvastatin,denies myalgias, doing well on medication. LDL at goal at 55 Continue current dose and recheck labs    Vitamin D deficiency: last labs were at goal, continue supplementations   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/07/2014   Calculus of kidney 09/07/2014   Memory loss or impairment 09/07/2014    Past Surgical History:  Procedure Laterality Date   BREAST BIOPSY Right    negative times 2   COLONOSCOPY WITH PROPOFOL N/A 01/20/2015   Procedure: COLONOSCOPY WITH PROPOFOL;  Surgeon: Molly Maduro  Daron Offer, MD;  Location: [facility] END[facility]COPY;  Service: Endoscopy;  Laterality: N/A;   COLONOSCOPY WITH PROPOFOL N/A 05/07/2020   Procedure: COLONOSCOPY WITH PROPOFOL;  Surgeon: Toledo, Boykin Nearing, MD;  Location: [facility] ENDOSCOPY;  Service: Gastroenterology;  Laterality: N/A;   LITHOTRIPSY Left 12/2013    Family History  Problem Relation Age of Onset   Osteoarthritis Mother    Kidney failure Mother    Failure to thrive Mother    Osteoarthritis Sister    Alzheimer's disease Maternal Aunt    Breast cancer Neg Hx     Social History   Tobacco Use   Smoking status: Never   Smokeless tobacco:  Never   Tobacco comments:    Smoking cessation materials not required  Substance Use Topics   Alcohol use: No    Alcohol/week: 0.0 standard drinks of alcohol     Current Outpatient Medications:    Accu-Chek Softclix Lancets lancets, Use as instructed, Disp: 100 each, Rfl: 12   Alcohol Swabs (ALCOHOL PREP) PADS, 1 each by Does not apply route daily., Disp: 100 each, Rfl: 3   blood glucose meter kit and supplies, Dispense based on patient and insurance preference. Check bid daily as directed. (FOR ICD-10 E10.9, E11.9)., Disp: 1 each, Rfl: 0   cholecalciferol (VITAMIN D3) 25 MCG (1000 UNIT) tablet, Take 1 tablet (1,000 Units total) by mouth daily., Disp: 90 tablet, Rfl: 1   citalopram (CELEXA) 40 MG tablet, Take 1 tablet (40 mg total) by mouth daily., Disp: 90 tablet, Rfl: 1   clonazePAM (KLONOPIN) 0.5 MG tablet, Take 1 tablet (0.5 mg total) by mouth 2 (two) times daily as needed., Disp: 30 tablet, Rfl: 0   glucose blood (ACCU-CHEK GUIDE) test strip, USE 1 TEST STRIP TWICE DAILY AS  NEEDED, Disp: 100 strip, Rfl: 6   hydroquinone 4 % cream, APPLY  CREAM TOPICALLY TO DARK SPOTS ON LEGS TWICE DAILY TO HELP FADE, Disp: 29 g, Rfl: 0   Semaglutide (RYBELSUS) 3 MG TABS, Take 1 tablet by mouth daily. Take on empty stomach, 30 minutes before the first food, beverage, or other oral medications of the day with 4 oz of plain water only, Disp: , Rfl:    tacrolimus (PROTOPIC) 0.1 % ointment, Apply to affected areas once to twice daily as directed., Disp: 100 g, Rfl: 1   atorvastatin (LIPITOR) 80 MG tablet, Take 1 tablet (80 mg total) by mouth every evening., Disp: 90 tablet, Rfl: 1   Blood Glucose Calibration (ACCU-CHEK GUIDE CONTROL) LIQD, 1 each by In Vitro route once for 1 dose., Disp: 1 each, Rfl: 3   Blood Glucose Monitoring Suppl (ACCU-CHEK GUIDE) w/Device KIT, 1 each by Does not apply route once for 1 dose., Disp: 1 kit, Rfl: 0   levothyroxine (SYNTHROID) 75 MCG tablet, Take 1 tablet (75 mcg total) by  mouth daily before breakfast., Disp: 100 tablet, Rfl: 0   loratadine (CLARITIN) 10 MG tablet, Take 1 tablet (10 mg total) by mouth daily., Disp: 90 tablet, Rfl: 1   losartan (COZAAR) 100 MG tablet, Take 1 tablet (100 mg total) by mouth daily., Disp: 90 tablet, Rfl: 1   metFORMIN (GLUCOPHAGE-XR) 750 MG 24 hr tablet, Take 2 tablets (1,500 mg total) by mouth daily with breakfast., Disp: 180 tablet, Rfl: 1  Allergies  Allergen Reactions   Sulfa Antibiotics     I personally reviewed active problem list, medication list, allergies, family history, social history, health maintenance with the patient/caregiver today.   ROS  Constitutional: Negative for  fever or weight change.  Respiratory: Negative for cough and shortness of breath.   Cardiovascular: Negative for chest pain or palpitations.  Gastrointestinal: Negative for abdominal pain, no bowel changes.  Musculoskeletal: Negative for gait problem or joint swelling.  Skin: Negative for rash.  Neurological: Negative for dizziness or headache.  No other specific complaints in a complete review of systems (except as listed in HPI above).   Objective  Vitals:   01/26/23 0936  BP: 128/78  Pulse: 88  Resp: 14  Temp: 97.8 F (36.6 C)  TempSrc: Oral  SpO2: 99%  Weight: 155 lb 4.8 oz (70.4 kg)  Height: 4' 11.5" (1.511 m)    Body mass index is 30.84 kg/m.  Physical Exam  Constitutional: Patient appears well-developed and well-nourished. Obese  No distress.  HEENT: head atraumatic, normocephalic, pupils equal and reactive to light, neck supple Cardiovascular: Normal rate, regular rhythm and normal heart sounds.  No murmur heard. No BLE edema. Pulmonary/Chest: Effort normal and breath sounds normal. No respiratory distress. Abdominal: Soft.  There is no tenderness. Psychiatric: Patient has a normal mood and affect. behavior is normal. Judgment and thought content normal.   Recent Results (from the past 2160 hour(s))  POCT HgB A1C      Status: Abnormal   Collection Time: 01/26/23  9:39 AM  Result Value Ref Range   Hemoglobin A1C 7.4 (A) 4.0 - 5.6 %   HbA1c POC (<> result, manual entry)     HbA1c, POC (prediabetic range)     HbA1c, POC (controlled diabetic range)       PHQ2/9:    01/26/2023    9:38 AM 09/29/2022    2:32 PM 05/18/2022    9:29 AM 04/22/2022    1:56 PM 01/14/2022    9:59 AM  Depression screen PHQ 2/9  Decreased Interest 0 0 0 0 0  Down, Depressed, Hopeless 0 0 0 0 0  PHQ - 2 Score 0 0 0 0 0  Altered sleeping 0 0 0  0  Tired, decreased energy 0 0 0  0  Change in appetite 0 0 0  0  Feeling bad or failure about yourself  0 0 0  0  Trouble concentrating 0 0 0  0  Moving slowly or fidgety/restless 0 0 0  0  Suicidal thoughts 0 0 0  0  PHQ-9 Score 0 0 0  0  Difficult doing work/chores  Not difficult at all       phq 9 is negative   Fall Risk:    01/26/2023    9:38 AM 09/29/2022    2:31 PM 05/18/2022    9:29 AM 04/22/2022    1:56 PM 01/14/2022    9:59 AM  Fall Risk   Falls in the past year? 0 0 0 0 0  Number falls in past yr:  0  0 0  Injury with Fall?  0  0 0  Risk for fall due to : No Fall Risks No Fall Risks No Fall Risks No Fall Risks No Fall Risks  Follow up Falls prevention discussed Falls prevention discussed;Education provided;Falls evaluation completed Falls prevention discussed;Education provided;Falls evaluation completed Falls evaluation completed Falls prevention discussed      Functional Status Survey: Is the patient deaf or have difficulty hearing?: No Does the patient have difficulty seeing, even when wearing glasses/contacts?: No Does the patient have difficulty concentrating, remembering, or making decisions?: No Does the patient have difficulty walking or climbing stairs?: No Does the patient  have difficulty dressing or bathing?: No Does the patient have difficulty doing errands alone such as visiting a doctor's office or shopping?: No    Assessment & Plan  1.  Controlled type 2 diabetes mellitus with microalbuminuria, without long-term current use of insulin (HCC)  - POCT HgB A1C - HM Diabetes Foot Exam - losartan (COZAAR) 100 MG tablet; Take 1 tablet (100 mg total) by mouth daily.  Dispense: 90 tablet; Refill: 1 - metFORMIN (GLUCOPHAGE-XR) 750 MG 24 hr tablet; Take 2 tablets (1,500 mg total) by mouth daily with breakfast.  Dispense: 180 tablet; Refill: 1 - Microalbumin / creatinine urine ratio  2. Major depression in remission (HCC)  Continue celexa, feeling better  3. Stage 3a chronic kidney disease (HCC)  - CBC with Differential/Platelet - COMPLETE METABOLIC PANEL WITH GFR  4. Dyslipidemia  - atorvastatin (LIPITOR) 80 MG tablet; Take 1 tablet (80 mg total) by mouth every evening.  Dispense: 90 tablet; Refill: 1 - Lipid panel  5. Hives  - loratadine (CLARITIN) 10 MG tablet; Take 1 tablet (10 mg total) by mouth daily.  Dispense: 90 tablet; Refill: 1  6. Adult hypothyroidism  - levothyroxine (SYNTHROID) 75 MCG tablet; Take 1 tablet (75 mcg total) by mouth daily before breakfast.  Dispense: 100 tablet; Refill: 0  7. B12 deficiency  - B12 and Folate Panel  8. Vitamin D deficiency  - VITAMIN D 25 Hydroxy (Vit-D Deficiency, Fractures)  9. Hypertension associated with type 2 diabetes mellitus (HCC)  - losartan (COZAAR) 100 MG tablet; Take 1 tablet (100 mg total) by mouth daily.  Dispense: 90 tablet; Refill: 1

## 2023-01-25 NOTE — Telephone Encounter (Signed)
Called and informed patient her Rybelsus has not been delivered to the office yet

## 2023-01-26 ENCOUNTER — Ambulatory Visit (INDEPENDENT_AMBULATORY_CARE_PROVIDER_SITE_OTHER): Payer: Medicare HMO | Admitting: Family Medicine

## 2023-01-26 ENCOUNTER — Encounter: Payer: Self-pay | Admitting: Family Medicine

## 2023-01-26 VITALS — BP 128/78 | HR 88 | Temp 97.8°F | Resp 14 | Ht 59.5 in | Wt 155.3 lb

## 2023-01-26 DIAGNOSIS — R809 Proteinuria, unspecified: Secondary | ICD-10-CM

## 2023-01-26 DIAGNOSIS — E538 Deficiency of other specified B group vitamins: Secondary | ICD-10-CM

## 2023-01-26 DIAGNOSIS — F325 Major depressive disorder, single episode, in full remission: Secondary | ICD-10-CM

## 2023-01-26 DIAGNOSIS — Z7984 Long term (current) use of oral hypoglycemic drugs: Secondary | ICD-10-CM

## 2023-01-26 DIAGNOSIS — N1831 Chronic kidney disease, stage 3a: Secondary | ICD-10-CM

## 2023-01-26 DIAGNOSIS — E1159 Type 2 diabetes mellitus with other circulatory complications: Secondary | ICD-10-CM

## 2023-01-26 DIAGNOSIS — E1129 Type 2 diabetes mellitus with other diabetic kidney complication: Secondary | ICD-10-CM | POA: Diagnosis not present

## 2023-01-26 DIAGNOSIS — E039 Hypothyroidism, unspecified: Secondary | ICD-10-CM

## 2023-01-26 DIAGNOSIS — L509 Urticaria, unspecified: Secondary | ICD-10-CM

## 2023-01-26 DIAGNOSIS — E785 Hyperlipidemia, unspecified: Secondary | ICD-10-CM

## 2023-01-26 DIAGNOSIS — E559 Vitamin D deficiency, unspecified: Secondary | ICD-10-CM

## 2023-01-26 DIAGNOSIS — I152 Hypertension secondary to endocrine disorders: Secondary | ICD-10-CM

## 2023-01-26 LAB — POCT GLYCOSYLATED HEMOGLOBIN (HGB A1C): Hemoglobin A1C: 7.4 % — AB (ref 4.0–5.6)

## 2023-01-26 MED ORDER — METFORMIN HCL ER 750 MG PO TB24
1500.0000 mg | ORAL_TABLET | Freq: Every day | ORAL | 1 refills | Status: AC
Start: 2023-01-26 — End: ?

## 2023-01-26 MED ORDER — LORATADINE 10 MG PO TABS: 10.0000 mg | ORAL_TABLET | Freq: Every day | ORAL | 1 refills | Status: DC

## 2023-01-26 MED ORDER — ATORVASTATIN CALCIUM 80 MG PO TABS: 80.0000 mg | ORAL_TABLET | Freq: Every evening | ORAL | 1 refills | Status: DC

## 2023-01-26 MED ORDER — LOSARTAN POTASSIUM 100 MG PO TABS
100.0000 mg | ORAL_TABLET | Freq: Every day | ORAL | 1 refills | Status: DC
Start: 2023-01-26 — End: 2023-05-27

## 2023-01-26 MED ORDER — LEVOTHYROXINE SODIUM 75 MCG PO TABS
75.0000 ug | ORAL_TABLET | Freq: Every day | ORAL | 0 refills | Status: DC
Start: 2023-01-26 — End: 2023-05-27

## 2023-01-27 ENCOUNTER — Other Ambulatory Visit: Payer: Self-pay | Admitting: Family Medicine

## 2023-01-27 LAB — CBC WITH DIFFERENTIAL/PLATELET
Absolute Monocytes: 516 {cells}/uL (ref 200–950)
Basophils Absolute: 40 cells/uL (ref 0–200)
Basophils Relative: 0.6 %
Eosinophils Absolute: 228 {cells}/uL (ref 15–500)
Eosinophils Relative: 3.4 %
HCT: 40.3 % (ref 35.0–45.0)
Hemoglobin: 13.2 g/dL (ref 11.7–15.5)
Lymphs Abs: 2526 cells/uL (ref 850–3900)
MCH: 30.8 pg (ref 27.0–33.0)
MCHC: 32.8 g/dL (ref 32.0–36.0)
MCV: 93.9 fL (ref 80.0–100.0)
MPV: 10.5 fL (ref 7.5–12.5)
Monocytes Relative: 7.7 %
Neutro Abs: 3390 {cells}/uL (ref 1500–7800)
Neutrophils Relative %: 50.6 %
Platelets: 252 10*3/uL (ref 140–400)
RBC: 4.29 10*6/uL (ref 3.80–5.10)
RDW: 13.7 % (ref 11.0–15.0)
Total Lymphocyte: 37.7 %
WBC: 6.7 10*3/uL (ref 3.8–10.8)

## 2023-01-27 LAB — COMPLETE METABOLIC PANEL WITH GFR
AG Ratio: 1.3 (calc) (ref 1.0–2.5)
ALT: 24 U/L (ref 6–29)
AST: 50 U/L — ABNORMAL HIGH (ref 10–35)
Albumin: 4.2 g/dL (ref 3.6–5.1)
Alkaline phosphatase (APISO): 93 U/L (ref 37–153)
BUN/Creatinine Ratio: 25 (calc) — ABNORMAL HIGH (ref 6–22)
BUN: 26 mg/dL — ABNORMAL HIGH (ref 7–25)
CO2: 24 mmol/L (ref 20–32)
Calcium: 10.1 mg/dL (ref 8.6–10.4)
Chloride: 105 mmol/L (ref 98–110)
Creat: 1.06 mg/dL — ABNORMAL HIGH (ref 0.60–1.00)
Globulin: 3.2 g/dL (ref 1.9–3.7)
Glucose, Bld: 108 mg/dL — ABNORMAL HIGH (ref 65–99)
Potassium: 4.6 mmol/L (ref 3.5–5.3)
Sodium: 138 mmol/L (ref 135–146)
Total Bilirubin: 0.5 mg/dL (ref 0.2–1.2)
Total Protein: 7.4 g/dL (ref 6.1–8.1)
eGFR: 55 mL/min/{1.73_m2} — ABNORMAL LOW (ref >=60)

## 2023-01-27 LAB — B12 AND FOLATE PANEL
Folate: 15.3 ng/mL
Vitamin B-12: 1088 pg/mL (ref 200–1100)

## 2023-01-27 LAB — LIPID PANEL
Cholesterol: 137 mg/dL (ref <200)
HDL: 43 mg/dL — ABNORMAL LOW (ref >=50)
LDL Cholesterol (Calc): 78 mg/dL
Non-HDL Cholesterol (Calc): 94 mg/dL (ref <130)
Total CHOL/HDL Ratio: 3.2 (calc) (ref <5.0)
Triglycerides: 76 mg/dL (ref <150)

## 2023-01-27 LAB — MICROALBUMIN / CREATININE URINE RATIO
Creatinine, Urine: 47 mg/dL (ref 20–275)
Microalb Creat Ratio: 15 mg/g{creat} (ref <30)
Microalb, Ur: 0.7 mg/dL

## 2023-01-27 LAB — VITAMIN D 25 HYDROXY (VIT D DEFICIENCY, FRACTURES): Vit D, 25-Hydroxy: 44 ng/mL (ref 30–100)

## 2023-02-14 ENCOUNTER — Other Ambulatory Visit: Payer: Self-pay | Admitting: Family Medicine

## 2023-02-21 ENCOUNTER — Other Ambulatory Visit: Payer: Medicare HMO | Admitting: Pharmacist

## 2023-02-21 NOTE — Progress Notes (Signed)
02/21/2023 Name: Megan Short MRN: [id number] DOB: [birth year]  Chief Complaint  Patient presents with   Medication Management    Ciel Boeckmann is a 77 y.o. year old female who was self-referred to the pharmacist for assistance in managing medication access.    Receive a message from patient requesting a call back to discuss her recent home blood sugar readings. Return call to patient   Subjective:   Care Team: Primary Care Provider: Alba Cory, MD ; Next Scheduled Visit: 05/26/2023  Medication Access/Adherence  Current Pharmacy:  [facility] [address] ([address]), [place] - [address]) [place] [address] Phone: [phone] Fax: [phone]  [facility] [phone] - Closed - [place], [place] - [address] [place] [address] Phone: [phone] Fax: [phone]  [facility] - [place], [place] - [address] [place] [address] Phone: [phone] Fax: [phone]  [facility] - [place], [place] - [address] [place] [address] Phone: [phone] Fax: [phone]  [facility] - [place], [place] - [address] [place] [address] Phone: [phone] Fax: [phone]   Patient reports affordability concerns with their medications: No  Patient reports access/transportation concerns to their pharmacy: No  Patient reports adherence concerns with their medications:  No       Diabetes:   Current medications:  - metformin ER 750 mg twice daily - Rybelsus 7 mg daily Confirms takes on empty stomach, >=30 minutes before the first food, beverage, or other oral medications of the day with <=4 oz of plain water only Reports tolerating well except had a recent episode of nausea/upset stomach one morning when went to yoga class after skipping breakfast that  morning  Medications tried in the past: pioglitazone   Current glucose readings: morning fasting ranging: 97-115  Denies symptoms of hypoglycemia such as shaking, sweating or dizziness   Current physical activity: Goes to gym (treadmill, aerobics and weights) at least 90 minutes Monday-Friday   Statin therapy: atorvastatin 80 mg daily   Current medication access support: enrolled in patient assistance for Rybelsus from [employer] through 06/07/2023   Objective:  Lab Results  Component Value Date   HGBA1C 7.4 (A) 01/26/2023    Lab Results  Component Value Date   CREATININE 1.06 (H) 01/26/2023   BUN 26 (H) 01/26/2023   NA 138 01/26/2023   K 4.6 01/26/2023   CL 105 01/26/2023   CO2 24 01/26/2023    Lab Results  Component Value Date   CHOL 137 01/26/2023   HDL 43 (L) 01/26/2023   LDLCALC 78 01/26/2023   TRIG 76 01/26/2023   CHOLHDL 3.2 01/26/2023    Medications Reviewed Today     Reviewed by Megan Short, RPH-CPP (Pharmacist) on 02/21/23 at 1751  Med List Status: <None>   Medication Order Taking? Sig Documenting Provider Last Dose Status Informant  Accu-Chek Softclix Lancets lancets [id number]  Use as instructed Alba Cory, MD  Active   Alcohol Swabs (DROPSAFE ALCOHOL PREP) 70 % PADS [id number]  USE AS DIRECTED Alba Cory, MD  Active   atorvastatin (LIPITOR) 80 MG tablet [id number]  Take 1 tablet (80 mg total) by mouth every evening. Alba Cory, MD  Active   Blood Glucose Calibration (ACCU-CHEK GUIDE CONTROL) LIQD [id number]  1 each by In Vitro route once for 1  doseAlba Cory, MD  Expired 09/20/22 2359   blood glucose meter kit and supplies [id number]  Dispense based on patient and insurance preference. Check bid daily as directed. (FOR ICD-10 E10.9, E11.9). Alba Cory, MD  Active   Blood Glucose Monitoring Suppl (ACCU-CHEK GUIDE) w/Device KIT [id number]  1 each by Does not apply route once for 1 dose. Alba Cory, MD  Expired 09/20/22  2359   cholecalciferol (VITAMIN D3) 25 MCG (1000 UNIT) tablet [id number]  Take 1 tablet (1,000 Units total) by mouth daily. Alba Cory, MD  Active   citalopram (CELEXA) 40 MG tablet [id number]  Take 1 tablet (40 mg total) by mouth daily. Alba Cory, MD  Active   clonazePAM Scarlette Calico) 0.5 MG tablet [id number]  Take 1 tablet (0.5 mg total) by mouth 2 (two) times daily as needed. Alba Cory, MD  Active   glucose blood (ACCU-CHEK GUIDE) test strip [id number]  USE 1 TEST STRIP TWICE DAILY AS  NEEDED Alba Cory, MD  Active   hydroquinone 4 % cream [id number]  APPLY  CREAM TOPICALLY TO DARK SPOTS ON LEGS TWICE DAILY TO HELP Darrol Poke, MD  Active   levothyroxine (SYNTHROID) 75 MCG tablet [id number]  Take 1 tablet (75 mcg total) by mouth daily before breakfast. Alba Cory, MD  Active   loratadine (CLARITIN) 10 MG tablet [id number]  Take 1 tablet (10 mg total) by mouth daily. Alba Cory, MD  Active   losartan (COZAAR) 100 MG tablet [id number]  Take 1 tablet (100 mg total) by mouth daily. Alba Cory, MD  Active   metFORMIN (GLUCOPHAGE-XR) 750 MG 24 hr tablet [id number] Yes Take 2 tablets (1,500 mg total) by mouth daily with breakfast. Alba Cory, MD Taking Active   Semaglutide 7 MG TABS [id number] Yes Take 1 tablet by mouth daily. Take on empty stomach, 30 minutes before the first food, beverage, or other oral medications of the day with 4 oz of plain water only [provider] Taking Active   tacrolimus (PROTOPIC) 0.1 % ointment [id number]  Apply to affected areas once to twice daily as directed. Willeen Niece, MD  Active               Assessment/Plan:   Diabetes: - Reviewed goal A1c, goal fasting, and goal 2 hour post prandial glucose - Encourage patient to have regular well-balanced meals while controlling carbohydrate portion sizes             Encourage patient to review nutrition labels for total carbohydrate content of foods             Advise  patient against skipping meals, particularly to reduce change of side effects with Rybelsus - Recommend to continue to check glucose, keep log of results and have this record to review during upcoming medical appointments     Follow Up Plan: Clinical Pharmacist will outreach to patient by telephone again on 03/30/2023 at 9:00 AM    Estelle Grumbles, PharmD, [facility] [phone]

## 2023-02-21 NOTE — Patient Instructions (Signed)
Goals Addressed             This Visit's Progress    Pharmacy Goals       Our goal A1c is less than 7%. This corresponds with fasting sugars less than 130 and 2 hour after meal sugars less than 180. Please keep a log of your results when checking your blood sugar   Our goal bad cholesterol, or LDL, is less than 70. This is why it is important to continue taking your atorvastatin.   Thank you!  Estelle Grumbles, PharmD, [facility] [phone]

## 2023-03-02 ENCOUNTER — Other Ambulatory Visit: Payer: Medicare HMO | Admitting: Pharmacist

## 2023-03-07 ENCOUNTER — Other Ambulatory Visit: Payer: Self-pay | Admitting: Family Medicine

## 2023-03-07 DIAGNOSIS — R809 Proteinuria, unspecified: Secondary | ICD-10-CM

## 2023-03-07 NOTE — Telephone Encounter (Signed)
Medication Refill - Medication: metFORMIN (GLUCOPHAGE-XR) 750 MG 24 hr tablet 3 month supply   Has the patient contacted their pharmacy? Yes.   (Agent: If no, request that the patient contact the pharmacy for the refill. If patient does not wish to contact the pharmacy document the reason why and proceed with request.) (Agent: If yes, when and what did the pharmacy advise?)  Preferred Pharmacy (with phone number or street name):  [facility] [address] ([address]), [place] - [address]  [address] [place]) [place] [address]  Phone: [phone] Fax: [phone]   Has the patient been seen for an appointment in the last year OR does the patient have an upcoming appointment? Yes.    Agent: Please be advised that RX refills may take up to 3 business days. We ask that you follow-up with your pharmacy.

## 2023-03-08 MED ORDER — METFORMIN HCL ER 750 MG PO TB24
1500.0000 mg | ORAL_TABLET | Freq: Every day | ORAL | 1 refills | Status: DC
Start: 2023-03-08 — End: 2023-05-27

## 2023-03-08 NOTE — Telephone Encounter (Signed)
Patient requesting refill. Future visit in 2 months. Requested Prescriptions  Pending Prescriptions Disp Refills   metFORMIN (GLUCOPHAGE-XR) 750 MG 24 hr tablet 180 tablet 1    Sig: Take 2 tablets (1,500 mg total) by mouth daily with breakfast.     Endocrinology:  Diabetes - Biguanides Failed - 03/08/2023  8:51 AM      Failed - Cr in normal range and within 360 days    Creat  Date Value Ref Range Status  01/26/2023 1.06 (H) 0.60 - 1.00 mg/dL Final   Creatinine, Urine  Date Value Ref Range Status  01/26/2023 47 20 - 275 mg/dL Final         Failed - eGFR in normal range and within 360 days    GFR, Est African American  Date Value Ref Range Status  01/29/2020 67 > OR = 60 mL/min/1.37m2 Final   GFR, Est Non African American  Date Value Ref Range Status  01/29/2020 58 (L) > OR = 60 mL/min/1.84m2 Final   eGFR  Date Value Ref Range Status  01/26/2023 55 (L) > OR = 60 mL/min/1.74m2 Final         Passed - HBA1C is between 0 and 7.9 and within 180 days    Hemoglobin A1C  Date Value Ref Range Status  01/26/2023 7.4 (A) 4.0 - 5.6 % Final  09/04/2015 6.8  Final   HbA1c, POC (prediabetic range)  Date Value Ref Range Status  11/17/2017 6.2 5.7 - 6.4 % Final   HbA1c, POC (controlled diabetic range)  Date Value Ref Range Status  07/30/2019 6.5 0.0 - 7.0 % Final   Hgb A1c MFr Bld  Date Value Ref Range Status  07/17/2021 7.0 (H) <5.7 % of total Hgb Final    Comment:    For someone without known diabetes, a hemoglobin A1c value of 6.5% or greater indicates that they may have  diabetes and this should be confirmed with a follow-up  test. . For someone with known diabetes, a value <7% indicates  that their diabetes is well controlled and a value  greater than or equal to 7% indicates suboptimal  control. A1c targets should be individualized based on  duration of diabetes, age, comorbid conditions, and  other considerations. . Currently, no consensus exists regarding use  of hemoglobin A1c for diagnosis of diabetes for children. .          Passed - B12 Level in normal range and within 720 days    Vitamin B-12  Date Value Ref Range Status  01/26/2023 1,088 200 - 1,100 pg/mL Final         Passed - Valid encounter within last 6 months    Recent Outpatient Visits           1 month ago Controlled type 2 diabetes mellitus with microalbuminuria, without long-term current use of insulin [facility])   [place] [facility] Alba Cory, MD   5 months ago Controlled type 2 diabetes mellitus with microalbuminuria, without long-term current use of insulin [facility])   [place] [facility] Alba Cory, MD   9 months ago Controlled type 2 diabetes mellitus with microalbuminuria, without long-term current use of insulin [facility])   [place] [facility] [place], Danna Hefty, MD   1 year ago Controlled type 2 diabetes mellitus with microalbuminuria, without long-term current use of insulin [facility])   [place] [facility] [place], Danna Hefty, MD   1 year ago Controlled type 2 diabetes mellitus with microalbuminuria,  without long-term current use of insulin [facility])   [place] [facility] Alba Cory, MD       Future Appointments             In 2 months Carlynn Purl, Danna Hefty, MD [facility]            Passed - CBC within normal limits and completed in the last 12 months    WBC  Date Value Ref Range Status  01/26/2023 6.7 3.8 - 10.8 Thousand/uL Final   RBC  Date Value Ref Range Status  01/26/2023 4.29 3.80 - 5.10 Million/uL Final   Hemoglobin  Date Value Ref Range Status  01/26/2023 13.2 11.7 - 15.5 g/dL Final   HCT  Date Value Ref Range Status  01/26/2023 40.3 35.0 - 45.0 % Final   MCHC  Date Value Ref Range Status  01/26/2023 32.8 32.0 - 36.0 g/dL Final   [facility]  Date Value Ref Range Status  01/26/2023 30.8 27.0 - 33.0 pg Final   MCV  Date Value  Ref Range Status  01/26/2023 93.9 80.0 - 100.0 fL Final   No results found for: "PLTCOUNTKUC", "LABPLAT", "POCPLA" RDW  Date Value Ref Range Status  01/26/2023 13.7 11.0 - 15.0 % Final

## 2023-03-30 ENCOUNTER — Encounter: Payer: Self-pay | Admitting: Pharmacist

## 2023-03-30 ENCOUNTER — Other Ambulatory Visit: Payer: Self-pay | Admitting: Pharmacist

## 2023-03-30 NOTE — Patient Instructions (Signed)
Goals Addressed             This Visit's Progress    Pharmacy Goals       Our goal A1c is less than 7%. This corresponds with fasting sugars less than 130 and 2 hour after meal sugars less than 180. Please keep a log of your results when checking your blood sugar   Our goal bad cholesterol, or LDL, is less than 70. This is why it is important to continue taking your atorvastatin.  Please watch the mail for an envelope from [employer] containing the patient assistance program application. Please complete this application and mail back to [facility] Pharmacy Technician Noreene Larsson Simcox along with a copy of your Medicare Part D prescription card and a copy of your proof of income document OR you can bring these documents to the office to have them faxed back to Attention: [name] at Fax # [phone]   If you need to call Noreene Larsson, you can reach her at [phone]  If you need to reach out to patient assistance programs regarding refills or to find out the status of your application, you can do so by calling:  [employer] at [phone]   Thank you!  Estelle Grumbles, PharmD, [facility] [phone]

## 2023-03-30 NOTE — Progress Notes (Signed)
03/30/2023 Name: Megan Short MRN: [id number] DOB: [birth year]  Chief Complaint  Patient presents with   Medication Management   Medication Assistance    Megan Short is a 77 y.o. year old female who presented for a telephone visit.   Receive a message from patient requesting a call back to discuss her recent home blood sugar readings. Return call to patient   Subjective:   Care Team: Primary Care Provider: Alba Cory, MD ; Next Scheduled Visit: 05/26/2023    Medication Access/Adherence  Current Pharmacy:  [facility] [address]), [place] - [address] [place] [address] Phone: [phone] Fax: [phone]  [facility] [phone] - Closed - [place], [place] - [address] [place] [address] Phone: [phone] Fax: [phone]  [facility] - [place], [place] - [address] [place] [address] Phone: [phone] Fax: [phone]  [facility] - [place], [place] - [address] [place] [address] Phone: [phone] Fax: [phone]  [facility] - [place], [place] - [address] [place] [address] Phone: [phone] Fax: [phone]   Patient reports affordability concerns with their medications: No  Patient reports access/transportation concerns to their pharmacy: No  Patient reports adherence concerns with their medications:  No       Diabetes:   Current medications:  - metformin ER 750 mg twice daily - Rybelsus 7 mg daily Confirms takes on empty stomach, >=30 minutes before the first food, beverage, or other oral medications of the day with <=4 oz of plain water only Reports tolerating well   Medications tried in the past: pioglitazone   Current glucose readings: morning fasting ranging: 92-113   Denies symptoms of hypoglycemia such as  shaking, sweating or dizziness   Current physical activity: Goes to gym (treadmill, aerobics and weights) at least 90 minutes Monday-Friday   Statin therapy: atorvastatin 80 mg daily   Current medication access support: enrolled in patient assistance for Rybelsus from [employer] through 06/07/2023  Reports has >2 month supply of Rybelsus remaining   Objective:  Lab Results  Component Value Date   HGBA1C 7.4 (A) 01/26/2023    Lab Results  Component Value Date   CREATININE 1.06 (H) 01/26/2023   BUN 26 (H) 01/26/2023   NA 138 01/26/2023   K 4.6 01/26/2023   CL 105 01/26/2023   CO2 24 01/26/2023    Lab Results  Component Value Date   CHOL 137 01/26/2023   HDL 43 (L) 01/26/2023   LDLCALC 78 01/26/2023   TRIG 76 01/26/2023   CHOLHDL 3.2 01/26/2023    Medications Reviewed Today     Reviewed by Manuela Neptune, RPH-CPP (Pharmacist) on 03/30/23 at 0908  Med List Status: <None>   Medication Order Taking? Sig Documenting Provider Last Dose Status Informant  Accu-Chek Softclix Lancets lancets [id number]  Use as instructed Alba Cory, MD  Active   Alcohol Swabs (DROPSAFE ALCOHOL PREP) 70 % PADS [id number]  USE AS DIRECTED Alba Cory, MD  Active   atorvastatin (LIPITOR) 80 MG tablet [id number]  Take 1 tablet (80 mg total) by mouth every evening. Alba Cory, MD  Active   Blood Glucose Calibration (ACCU-CHEK GUIDE CONTROL) LIQD [id number]  1 each by In Vitro route once for 1 dose. Alba Cory, MD  Expired 09/20/22 [id number]  blood glucose meter kit and supplies [id number]  Dispense based on patient and insurance preference. Check bid daily as directed. (FOR ICD-10 E10.9, E11.9). Alba Cory, MD  Active   Blood Glucose Monitoring Suppl (ACCU-CHEK GUIDE) w/Device KIT [id number]  1 each by Does not apply route once for 1 dose. Alba Cory, MD  Expired 09/20/22 2359   cholecalciferol (VITAMIN D3) 25 MCG (1000 UNIT) tablet [id number]  Take 1 tablet (1,000 Units total)  by mouth daily. Alba Cory, MD  Active   citalopram (CELEXA) 40 MG tablet [id number]  Take 1 tablet (40 mg total) by mouth daily. Alba Cory, MD  Active   clonazePAM Scarlette Calico) 0.5 MG tablet [id number]  Take 1 tablet (0.5 mg total) by mouth 2 (two) times daily as needed. Alba Cory, MD  Active   glucose blood (ACCU-CHEK GUIDE) test strip [id number]  USE 1 TEST STRIP TWICE DAILY AS  NEEDED Alba Cory, MD  Active   hydroquinone 4 % cream [id number]  APPLY  CREAM TOPICALLY TO DARK SPOTS ON LEGS TWICE DAILY TO HELP Darrol Poke, MD  Active   levothyroxine (SYNTHROID) 75 MCG tablet [id number]  Take 1 tablet (75 mcg total) by mouth daily before breakfast. Alba Cory, MD  Active   loratadine (CLARITIN) 10 MG tablet [id number]  Take 1 tablet (10 mg total) by mouth daily. Alba Cory, MD  Active   losartan (COZAAR) 100 MG tablet [id number]  Take 1 tablet (100 mg total) by mouth daily. Alba Cory, MD  Active   metFORMIN (GLUCOPHAGE-XR) 750 MG 24 hr tablet [id number] Yes Take 2 tablets (1,500 mg total) by mouth daily with breakfast. Alba Cory, MD Taking Active   Semaglutide 7 MG TABS [id number] Yes Take 1 tablet by mouth daily. Take on empty stomach, 30 minutes before the first food, beverage, or other oral medications of the day with 4 oz of plain water only [provider] Taking Active   tacrolimus (PROTOPIC) 0.1 % ointment [id number]  Apply to affected areas once to twice daily as directed. Willeen Niece, MD  Active               Assessment/Plan:   Diabetes: - Reviewed goal A1c, goal fasting, and goal 2 hour post prandial glucose - Encourage patient to have regular well-balanced meals while controlling carbohydrate portion sizes - Recommend to continue to check glucose, keep log of results and have this record to review during upcoming medical appointments - Will collaborate with PCP and CPhT to aid patient with pursing re-enrollment in patient  assistance for Rybelsus from [employer] for 2025 calendar year - Remind patient to follow up with [employer] patient assistance program as needed for refills of Rybelsus   Follow Up Plan: Clinical Pharmacist will outreach to patient by telephone again on 06/29/2023 at 9:00 AM   Estelle Grumbles, PharmD, [facility] [phone]

## 2023-04-05 ENCOUNTER — Telehealth: Payer: Self-pay | Admitting: Pharmacy Technician

## 2023-04-05 DIAGNOSIS — Z5986 Financial insecurity: Secondary | ICD-10-CM

## 2023-04-05 NOTE — Progress Notes (Signed)
[facility] Customer service manager [facility])                                            [facility] Quality Pharmacy Team    04/05/2023  Jannice Faivre 08/10/1945 [id number]                                      Medication Assistance Referral  Referral From:  [facility] PharmD Estelle Grumbles  Medication/Company: Rybelsus / [employer] Patient application portion:  Mailed Provider application portion: Faxed  to Dr. Alba Cory Provider address/fax verified via: Office website  [name], CPhT [place]  Office: [phone] Fax: [phone] Email: [email or web address]

## 2023-04-06 ENCOUNTER — Ambulatory Visit: Payer: Self-pay | Admitting: *Deleted

## 2023-04-06 NOTE — Telephone Encounter (Signed)
  Chief Complaint: shoulder pain-right Symptoms: "catch" right shoulder- mild Frequency: started yesterday Pertinent Negatives: Patient denies chest pain, trouble breathing Disposition: [] ED /[] Urgent Care (no appt availability in office) / [] Appointment(In office/virtual)/ []  [place] [facility]/ [x] Home Care/ [] Refused Recommended Disposition /[] [place] [facility]/ []  Follow-up with PCP Additional Notes: Patient advised per protocol- home care (tylenol, heat, rest)- she will call back if her symptoms get worse. Will send message to PCP to see if she has any other suggestions for her

## 2023-04-06 NOTE — Telephone Encounter (Signed)
Summary: neck/back pain   Patient states that she started having lower neck/upper back pain 2 days ago. Patient wants to know if she needs to be seen in office or if it can be managed at home. Please advise         Reason for Disposition  Shoulder pain  Answer Assessment - Initial Assessment Questions 1. ONSET: "When did the pain start?"     Yesterday-"catch" when turns 2. LOCATION: "Where is the pain located?"     Upper back-right 3. PAIN: "How bad is the pain?" (Scale 1-10; or mild, moderate, severe)   - MILD (1-3): doesn't interfere with normal activities   - MODERATE (4-7): interferes with normal activities (e.g., work or school) or awakens from sleep   - SEVERE (8-10): excruciating pain, unable to do any normal activities, unable to move arm at all due to pain     mild 4. WORK OR EXERCISE: "Has there been any recent work or exercise that involved this part of the body?"     Patient uses treadmill- possible pillow 5. CAUSE: "What do you think is causing the shoulder pain?"     unsure 6. OTHER SYMPTOMS: "Do you have any other symptoms?" (e.g., neck pain, swelling, rash, fever, numbness, weakness)     no  Protocols used: Shoulder Pain-A-AH

## 2023-04-07 ENCOUNTER — Ambulatory Visit: Payer: Medicare HMO | Admitting: Nurse Practitioner

## 2023-04-07 ENCOUNTER — Encounter: Payer: Self-pay | Admitting: Nurse Practitioner

## 2023-04-07 VITALS — BP 122/74 | HR 98 | Temp 98.2°F | Resp 18 | Ht 59.5 in | Wt 142.0 lb

## 2023-04-07 DIAGNOSIS — M549 Dorsalgia, unspecified: Secondary | ICD-10-CM

## 2023-04-07 DIAGNOSIS — M542 Cervicalgia: Secondary | ICD-10-CM

## 2023-04-07 DIAGNOSIS — M25511 Pain in right shoulder: Secondary | ICD-10-CM

## 2023-04-07 MED ORDER — NAPROXEN 500 MG PO TABS: 500.0000 mg | ORAL_TABLET | Freq: Two times a day (BID) | ORAL | 0 refills | Status: AC

## 2023-04-07 MED ORDER — TIZANIDINE HCL 4 MG PO TABS
2.0000 mg | ORAL_TABLET | Freq: Three times a day (TID) | ORAL | 0 refills | Status: DC | PRN
Start: 2023-04-07 — End: 2023-10-13

## 2023-04-07 MED ORDER — NAPROXEN 500 MG PO TABS: 500.0000 mg | ORAL_TABLET | Freq: Two times a day (BID) | ORAL | 0 refills | Status: DC

## 2023-04-07 NOTE — Progress Notes (Signed)
BP 122/74   Pulse 98   Temp 98.2 F (36.8 C)   Resp 18   Ht 4' 11.5" (1.511 m)   Wt 142 lb (64.4 kg)   SpO2 99%   BMI 28.20 kg/m    Subjective:    Patient ID: Megan Short, female    DOB: [birth year], 77 y.o.   MRN: [id number]  HPI: Megan Short is a 77 y.o. female  Chief Complaint  Patient presents with   Shoulder Pain    Rioght shoulder blade onset 3 days   Right shoulder/upper right side back/ right neck pain: started three days ago. Patient reports that she does not recall an injury. She says she did pick up a heavy frying pan.  She says her neck feels tight.  She says that pain is exacerbated with movement. She does have decrease ROM of right shoulder but no pain in the joint. She has been using ice and heat therapy.  Recommend continued heat therapy, start  naproxen BID and tizanidine for as needed.    Relevant past medical, surgical, family and social history reviewed and updated as indicated. Interim medical history since our last visit reviewed. Allergies and medications reviewed and updated.  Review of Systems  Ten systems reviewed and is negative except as mentioned in HPI       Objective:    BP 122/74   Pulse 98   Temp 98.2 F (36.8 C)   Resp 18   Ht 4' 11.5" (1.511 m)   Wt 142 lb (64.4 kg)   SpO2 99%   BMI 28.20 kg/m   Wt Readings from Last 3 Encounters:  04/07/23 142 lb (64.4 kg)  01/26/23 155 lb 4.8 oz (70.4 kg)  09/29/22 147 lb 6.4 oz (66.9 kg)    Physical Exam  Constitutional: Patient appears well-developed and well-nourished.  No distress.  HEENT: head atraumatic, normocephalic, pupils equal and reactive to light, neck supple Cardiovascular: Normal rate, regular rhythm and normal heart sounds.  No murmur heard. No BLE edema. Pulmonary/Chest: Effort normal and breath sounds normal. No respiratory distress. Abdominal: Soft.  There is no tenderness. MSK: tenderness upper trapezius muscle Psychiatric: Patient has a normal mood and affect.  behavior is normal. Judgment and thought content normal.   Results for orders placed or performed in visit on 01/26/23  Lipid panel  Result Value Ref Range   Cholesterol 137 <200 mg/dL   HDL 43 (L) > OR = 50 mg/dL   Triglycerides 76 <086 mg/dL   LDL Cholesterol (Calc) 78 mg/dL (calc)   Total CHOL/HDL Ratio 3.2 <5.0 (calc)   Non-HDL Cholesterol (Calc) 94 <578 mg/dL (calc)  Microalbumin / creatinine urine ratio  Result Value Ref Range   Creatinine, Urine 47 20 - 275 mg/dL   Microalb, Ur 0.7 mg/dL   Microalb Creat Ratio 15 <30 mg/g creat  CBC with Differential/Platelet  Result Value Ref Range   WBC 6.7 3.8 - 10.8 Thousand/uL   RBC 4.29 3.80 - 5.10 Million/uL   Hemoglobin 13.2 11.7 - 15.5 g/dL   HCT 46.9 62.9 - 52.8 %   MCV 93.9 80.0 - 100.0 fL   MCH 30.8 27.0 - 33.0 pg   MCHC 32.8 32.0 - 36.0 g/dL   RDW 41.3 24.4 - 01.0 %   Platelets 252 140 - 400 Thousand/uL   MPV 10.5 7.5 - 12.5 fL   Neutro Abs 3,390 1,500 - 7,800 cells/uL   Lymphs Abs 2,526 850 - 3,900 cells/uL   Absolute Monocytes  516 200 - 950 cells/uL   Eosinophils Absolute 228 15 - 500 cells/uL   Basophils Absolute 40 0 - 200 cells/uL   Neutrophils Relative % 50.6 %   Total Lymphocyte 37.7 %   Monocytes Relative 7.7 %   Eosinophils Relative 3.4 %   Basophils Relative 0.6 %  COMPLETE METABOLIC PANEL WITH GFR  Result Value Ref Range   Glucose, Bld 108 (H) 65 - 99 mg/dL   BUN 26 (H) 7 - 25 mg/dL   Creat 2.84 (H) 1.32 - 1.00 mg/dL   eGFR 55 (L) > OR = 60 mL/min/1.74m2   BUN/Creatinine Ratio 25 (H) 6 - 22 (calc)   Sodium 138 135 - 146 mmol/L   Potassium 4.6 3.5 - 5.3 mmol/L   Chloride 105 98 - 110 mmol/L   CO2 24 20 - 32 mmol/L   Calcium 10.1 8.6 - 10.4 mg/dL   Total Protein 7.4 6.1 - 8.1 g/dL   Albumin 4.2 3.6 - 5.1 g/dL   Globulin 3.2 1.9 - 3.7 g/dL (calc)   AG Ratio 1.3 1.0 - 2.5 (calc)   Total Bilirubin 0.5 0.2 - 1.2 mg/dL   Alkaline phosphatase (APISO) 93 37 - 153 U/L   AST 50 (H) 10 - 35 U/L   ALT 24 6 -  29 U/L  VITAMIN D 25 Hydroxy (Vit-D Deficiency, Fractures)  Result Value Ref Range   Vit D, 25-Hydroxy 44 30 - 100 ng/mL  B12 and Folate Panel  Result Value Ref Range   Vitamin B-12 1,088 200 - 1,100 pg/mL   Folate 15.3 ng/mL  POCT HgB A1C  Result Value Ref Range   Hemoglobin A1C 7.4 (A) 4.0 - 5.6 %   HbA1c POC (<> result, manual entry)     HbA1c, POC (prediabetic range)     HbA1c, POC (controlled diabetic range)        Assessment & Plan:   Problem List Items Addressed This Visit   None Visit Diagnoses     Acute pain of right shoulder    -  Primary   Recommend continued heat therapy, start  naproxen BID and tizanidine for as needed.   Relevant Medications   tiZANidine (ZANAFLEX) 4 MG tablet   naproxen (NAPROSYN) 500 MG tablet   Upper back pain on right side       Recommend continued heat therapy, start  naproxen BID and tizanidine for as needed.   Relevant Medications   tiZANidine (ZANAFLEX) 4 MG tablet   naproxen (NAPROSYN) 500 MG tablet   Neck pain on right side       Recommend continued heat therapy, start  naproxen BID and tizanidine for as needed.   Relevant Medications   tiZANidine (ZANAFLEX) 4 MG tablet   naproxen (NAPROSYN) 500 MG tablet        Follow up plan: Return if symptoms worsen or fail to improve.

## 2023-04-22 ENCOUNTER — Telehealth: Payer: Self-pay | Admitting: Family Medicine

## 2023-04-22 NOTE — Telephone Encounter (Signed)
Pt is calling in because she has been on Rybelsus 7 MG and pt says because of the medication she hasn't had any type of appetite and has been experiencing dizziness from the meds. Pt wants to know if Dr. Carlynn Purl would be willing to lower her dosage because she believes the dizziness comes from the lack of nutrition since she doesn't eat like she should due to not having an appetite. Please follow up with pt.

## 2023-04-22 NOTE — Telephone Encounter (Signed)
Called patient and made aware. Reviewed sugar free ginger ale or ginger sticks to help with nausea. Advised to check blood sugar and bp when feeling dizzy to monitor. So far glucose has been well controlled w/no drops, and during dizzy episode husband checked her glucose and it was normal.

## 2023-04-23 ENCOUNTER — Other Ambulatory Visit: Payer: Self-pay | Admitting: Family Medicine

## 2023-04-23 DIAGNOSIS — E1129 Type 2 diabetes mellitus with other diabetic kidney complication: Secondary | ICD-10-CM

## 2023-05-13 NOTE — Progress Notes (Deleted)
Name: Megan Short   MRN: [id number]    DOB: 1946/01/04   Date:05/13/2023       Progress Note  Subjective  Chief Complaint  No chief complaint on file.   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   Calculus of kidney 09/07/2014   Memory loss or impairment 09/07/2014    Past Surgical History:  Procedure Laterality Date   BREAST BIOPSY Right    negative times 2   COLONOSCOPY WITH PROPOFOL N/A 01/20/2015   Procedure: COLONOSCOPY WITH PROPOFOL;  Surgeon: Scot Jun, MD;  Location: [facility] ENDOSCOPY;  Service: Endoscopy;  Laterality: N/A;   COLONOSCOPY WITH PROPOFOL N/A 05/07/2020   Procedure: COLONOSCOPY WITH PROPOFOL;  Surgeon: Toledo, Boykin Nearing, MD;  Location: [facility] ENDOSCOPY;  Service: Gastroenterology;  Laterality: N/A;   LITHOTRIPSY Left 12/2013    Family History  Problem Relation Age of Onset   Osteoarthritis Mother    Kidney failure Mother    Failure to thrive Mother    Osteoarthritis Sister    Alzheimer's disease Maternal Aunt    Breast cancer Neg Hx     Social History   Tobacco Use   Smoking status: Never   Smokeless tobacco: Never   Tobacco comments:    Smoking cessation materials not required  Substance Use Topics   Alcohol use: No    Alcohol/week: 0.0 standard drinks of alcohol     Current Outpatient Medications:    Accu-Chek Softclix Lancets lancets, Use as instructed, Disp: 100 each, Rfl: 12   Alcohol Swabs (DROPSAFE ALCOHOL PREP) 70  % PADS, USE AS DIRECTED, Disp: 200 each, Rfl: 3   atorvastatin (LIPITOR) 80 MG tablet, Take 1 tablet (80 mg total) by mouth every evening., Disp: 90 tablet, Rfl: 1   Blood Glucose Calibration (ACCU-CHEK GUIDE CONTROL) LIQD, 1 each by In Vitro route once for 1 dose., Disp: 1 each, Rfl: 3   blood glucose meter kit and supplies, Dispense based on patient and insurance preference. Check bid daily as directed. (FOR ICD-10 E10.9, E11.9)., Disp: 1 each, Rfl: 0   Blood Glucose Monitoring Suppl (ACCU-CHEK GUIDE) w/Device KIT, 1 each by Does not apply route once for 1 dose., Disp: 1 kit, Rfl: 0   cholecalciferol (VITAMIN D3) 25 MCG (1000 UNIT) tablet, Take 1 tablet (1,000 Units total) by mouth daily., Disp: 90 tablet, Rfl: 1   citalopram (CELEXA) 40 MG tablet, Take 1 tablet (40 mg total) by mouth daily., Disp: 90 tablet, Rfl: 1   clonazePAM (KLONOPIN) 0.5 MG tablet, Take 1 tablet (0.5 mg total) by mouth 2 (two) times daily as needed., Disp: 30 tablet, Rfl: 0   glucose blood (ACCU-CHEK GUIDE TEST) test strip, TEST BLOOD SUGAR TWICE DAILY AS NEEDED, Disp: 200 strip, Rfl: 0   hydroquinone 4 % cream, APPLY  CREAM TOPICALLY TO DARK SPOTS ON LEGS TWICE DAILY TO HELP FADE, Disp: 29 g, Rfl: 0   levothyroxine (SYNTHROID) 75 MCG tablet, Take 1 tablet (75 mcg total) by mouth daily before breakfast., Disp: 100 tablet, Rfl:  0   loratadine (CLARITIN) 10 MG tablet, Take 1 tablet (10 mg total) by mouth daily., Disp: 90 tablet, Rfl: 1   losartan (COZAAR) 100 MG tablet, Take 1 tablet (100 mg total) by mouth daily., Disp: 90 tablet, Rfl: 1   metFORMIN (GLUCOPHAGE-XR) 750 MG 24 hr tablet, Take 2 tablets (1,500 mg total) by mouth daily with breakfast., Disp: 180 tablet, Rfl: 1   Semaglutide 7 MG TABS, Take 1 tablet by mouth daily. Take on empty stomach, 30 minutes before the first food, beverage, or other oral medications of the day with 4 oz of plain water only, Disp: , Rfl:    tacrolimus (PROTOPIC) 0.1 % ointment, Apply to  affected areas once to twice daily as directed., Disp: 100 g, Rfl: 1   tiZANidine (ZANAFLEX) 4 MG tablet, Take 0.5-1.5 tablets (2-6 mg total) by mouth every 8 (eight) hours as needed for muscle spasms (muscle tightness)., Disp: 30 tablet, Rfl: 0  Allergies  Allergen Reactions   Sulfa Antibiotics     I personally reviewed {Reviewed:14835} with the patient/caregiver today.   ROS  ***  Objective  There were no vitals filed for this visit.  There is no height or weight on file to calculate BMI.  Physical Exam ***  No results found for this or any previous visit (from the past 2160 hour(s)).  Diabetic Foot Exam: Diabetic Foot Exam - Simple   No data filed    ***  PHQ2/9:    04/07/2023   11:29 AM 01/26/2023    9:38 AM 09/29/2022    2:32 PM 05/18/2022    9:29 AM 04/22/2022    1:56 PM  Depression screen PHQ 2/9  Decreased Interest 0 0 0 0 0  Down, Depressed, Hopeless 0 0 0 0 0  PHQ - 2 Score 0 0 0 0 0  Altered sleeping 0 0 0 0   Tired, decreased energy 0 0 0 0   Change in appetite 0 0 0 0   Feeling bad or failure about yourself  0 0 0 0   Trouble concentrating 0 0 0 0   Moving slowly or fidgety/restless 0 0 0 0   Suicidal thoughts 0 0 0 0   PHQ-9 Score 0 0 0 0   Difficult doing work/chores Not difficult at all  Not difficult at all      phq 9 is {gen pos [id number]} ***  Fall Risk:    04/07/2023   11:27 AM 01/26/2023    9:38 AM 09/29/2022    2:31 PM 05/18/2022    9:29 AM 04/22/2022    1:56 PM  Fall Risk   Falls in the past year? 0 0 0 0 0  Number falls in past yr: 0  0  0  Injury with Fall? 0  0  0  Risk for fall due to :  No Fall Risks No Fall Risks No Fall Risks No Fall Risks  Follow up  Falls prevention discussed Falls prevention discussed;Education provided;Falls evaluation completed Falls prevention discussed;Education provided;Falls evaluation completed Falls evaluation completed   ***   Functional Status Survey:   ***   Assessment &  Plan  *** There are no diagnoses linked to this encounter.

## 2023-05-16 NOTE — Progress Notes (Signed)
Name: Megan Short   MRN: [id number]    DOB: Apr 20, 1946   Date:05/27/2023       Progress Note  Subjective  Chief Complaint  Chief Complaint  Patient presents with   Medical Management of Chronic Issues    HPI  Discussed the use of AI scribe software for clinical note transcription with the patient, who gave verbal consent to proceed.  History of Present Illness   The patient, with a history of type 2 diabetes, chronic kidney disease, hypertension, and dyslipidemia, has been on a regimen of Rybelsus and metformin. She reports significant weight loss of about 16 pounds over the past four months, which she attributes to starting Rybelsus. She describes a decreased appetite and a tendency to skip meals due to lack of hunger. Despite this, she reports eating healthy when she does eat, including lean proteins and fruits.  The patient also reports a metallic taste in her mouth, a known side effect of Ryblesus . She expresses satisfaction with the weight loss but expresses concern about losing too much weight. She has been self-adjusting her metformin dosage, reducing it from two pills a day to one, which she started about two weeks prior to the consultation.  The patient also has a history of major depression, which is currently in remission. She is on a daily regimen of citalopram, which she reports compliance. She also has a prescription for clonazepam, which she takes very seldomly.  In terms of her chronic kidney disease, the patient reports no current symptoms such as itching or voiding issues. She has stopped seeing her nephrologist, Dr. Thedore Mins, as her GFR has been stable in the fifties.  The patient also mentions a history of colonoscopies due to a family history of polyps. Her last colonoscopy was clear, and she is considering whether to continue with these procedures. She also mentions a previous bone density test and is planning to schedule another one in the near future.  Lastly, the  patient mentions a change in her insurance plan, which will take effect in January. She plans to switch her pharmacy to [facility] at that time.         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/07/2014   Calculus of kidney 09/07/2014   Memory loss or impairment 09/07/2014    Past Surgical History:  Procedure Laterality Date   BREAST BIOPSY Right    negative times 2   COLONOSCOPY WITH PROPOFOL N/A 01/20/2015   Procedure: COLONOSCOPY WITH PROPOFOL;  Surgeon: Scot Jun, MD;  Location: [facility] ENDOSCOPY;  Service: Endoscopy;  Laterality: N/A;   COLONOSCOPY WITH PROPOFOL N/A 05/07/2020   Procedure: COLONOSCOPY WITH PROPOFOL;  Surgeon: Toledo, Boykin Nearing, MD;  Location: [facility] ENDOSCOPY;  Service: Gastroenterology;  Laterality: N/A;   LITHOTRIPSY Left 12/2013    Family History  Problem Relation Age of Onset   Osteoarthritis Mother    Kidney failure Mother    Failure to thrive Mother    Osteoarthritis Sister    Alzheimer's disease Maternal Aunt    Breast cancer Neg  Hx     Social History   Tobacco Use   Smoking status: Never   Smokeless tobacco: Never   Tobacco comments:    Smoking cessation materials not required  Substance Use Topics   Alcohol use: No    Alcohol/week: 0.0 standard drinks of alcohol     Current Outpatient Medications:    Accu-Chek Softclix Lancets lancets, Use as instructed, Disp: 100 each, Rfl: 12   Alcohol Swabs (DROPSAFE ALCOHOL PREP) 70 % PADS, USE AS  DIRECTED, Disp: 200 each, Rfl: 3   atorvastatin (LIPITOR) 80 MG tablet, Take 1 tablet (80 mg total) by mouth every evening., Disp: 90 tablet, Rfl: 1   blood glucose meter kit and supplies, Dispense based on patient and insurance preference. Check bid daily as directed. (FOR ICD-10 E10.9, E11.9)., Disp: 1 each, Rfl: 0   cholecalciferol (VITAMIN D3) 25 MCG (1000 UNIT) tablet, Take 1 tablet (1,000 Units total) by mouth daily., Disp: 90 tablet, Rfl: 1   citalopram (CELEXA) 40 MG tablet, Take 1 tablet (40 mg total) by mouth daily., Disp: 90 tablet, Rfl: 1   clonazePAM (KLONOPIN) 0.5 MG tablet, Take 1 tablet (0.5 mg total) by mouth 2 (two) times daily as needed., Disp: 30 tablet, Rfl: 0   glucose blood (ACCU-CHEK GUIDE TEST) test strip, TEST BLOOD SUGAR TWICE DAILY AS NEEDED, Disp: 200 strip, Rfl: 0   hydroquinone 4 % cream, APPLY  CREAM TOPICALLY TO DARK SPOTS ON LEGS TWICE DAILY TO HELP FADE, Disp: 29 g, Rfl: 0   levothyroxine (SYNTHROID) 75 MCG tablet, Take 1 tablet (75 mcg total) by mouth daily before breakfast., Disp: 100 tablet, Rfl: 0   loratadine (CLARITIN) 10 MG tablet, Take 1 tablet (10 mg total) by mouth daily., Disp: 90 tablet, Rfl: 1   losartan (COZAAR) 100 MG tablet, Take 1 tablet (100 mg total) by mouth daily., Disp: 90 tablet, Rfl: 1   metFORMIN (GLUCOPHAGE-XR) 750 MG 24 hr tablet, Take 2 tablets (1,500 mg total) by mouth daily with breakfast., Disp: 180 tablet, Rfl: 1   Semaglutide 7 MG TABS, Take 1 tablet by mouth daily. Take on empty stomach, 30 minutes before the first food, beverage, or other oral medications of the day with 4 oz of plain water only, Disp: , Rfl:    tacrolimus (PROTOPIC) 0.1 % ointment, Apply to affected areas once to twice daily as directed., Disp: 100 g, Rfl: 1   tiZANidine (ZANAFLEX) 4 MG tablet, Take 0.5-1.5 tablets (2-6 mg total) by mouth every 8 (eight) hours as needed for muscle spasms (muscle tightness)., Disp: 30 tablet, Rfl: 0   Blood Glucose Calibration  (ACCU-CHEK GUIDE CONTROL) LIQD, 1 each by In Vitro route once for 1 dose., Disp: 1 each, Rfl: 3   Blood Glucose Monitoring Suppl (ACCU-CHEK GUIDE) w/Device KIT, 1 each by Does not apply route once for 1 dose., Disp: 1 kit, Rfl: 0  Allergies  Allergen Reactions   Sulfa Antibiotics     I personally reviewed active problem list, medication list, allergies, family history with the patient/caregiver today.   ROS  Ten systems reviewed and is negative except as mentioned in HPI    Objective  Vitals:   05/27/23 1121  BP: 118/74  Pulse: 97  Resp: 18  Weight: 132 lb 6.4 oz (60.1 kg)  Height: 4' 11.5" (1.511 m)    Body mass index is 26.29 kg/m.  Physical Exam  Constitutional: Patient appears well-developed and well-nourished. Obese  No distress.  HEENT: head atraumatic, normocephalic, pupils  equal and reactive to light, neck supple,  Cardiovascular: Normal rate, regular rhythm and normal heart sounds.  No murmur heard. No BLE edema. Pulmonary/Chest: Effort normal and breath sounds normal. No respiratory distress. Abdominal: Soft.  There is no tenderness. Psychiatric: Patient has a normal mood and affect. behavior is normal. Judgment and thought content normal.   Recent Results (from the past 2160 hours)  POCT glycosylated hemoglobin (Hb A1C)     Status: Abnormal   Collection Time: 05/27/23 11:28 AM  Result Value Ref Range   Hemoglobin A1C 5.8 (A) 4.0 - 5.6 %   HbA1c POC (<> result, manual entry)     HbA1c, POC (prediabetic range)     HbA1c, POC (controlled diabetic range)      PHQ2/9:    05/27/2023   11:19 AM 05/26/2023   10:51 AM 04/07/2023   11:29 AM 01/26/2023    9:38 AM 09/29/2022    2:32 PM  Depression screen PHQ 2/9  Decreased Interest 0 0 0 0 0  Down, Depressed, Hopeless 0 0 0 0 0  PHQ - 2 Score 0 0 0 0 0  Altered sleeping 0 0 0 0 0  Tired, decreased energy 0 0 0 0 0  Change in appetite 0 0 0 0 0  Feeling bad or failure about yourself  0 0 0 0 0  Trouble  concentrating 0 0 0 0 0  Moving slowly or fidgety/restless 0 0 0 0 0  Suicidal thoughts 0 0 0 0 0  PHQ-9 Score 0 0 0 0 0  Difficult doing work/chores Not difficult at all Not difficult at all Not difficult at all  Not difficult at all    phq 9 is negative   Fall Risk:    05/26/2023   10:54 AM 04/07/2023   11:27 AM 01/26/2023    9:38 AM 09/29/2022    2:31 PM 05/18/2022    9:29 AM  Fall Risk   Falls in the past year? 0 0 0 0 0  Number falls in past yr: 0 0  0   Injury with Fall? 0 0  0   Risk for fall due to : No Fall Risks  No Fall Risks No Fall Risks No Fall Risks  Follow up Falls prevention discussed;Falls evaluation completed  Falls prevention discussed Falls prevention discussed;Education provided;Falls evaluation completed Falls prevention discussed;Education provided;Falls evaluation completed      Assessment & Plan  Assessment and Plan    Type 2 Diabetes Mellitus Improved glycemic control with A1c of 5.8, down from 7.4. Patient has lost significant weight (23 lbs) since starting Rybelsus, but reports decreased appetite and metallic taste, likely side effects of the medication. -Continue Rybelsus 7mg  daily. -Reduce Metformin from 1500mg  to 750mg  daily. -Recheck A1c in 4 months.  Chronic Kidney Disease (Stage 3A) Stable GFR in the 50s. Patient is on Losartan for kidney protection. -Continue Losartan. -Avoid NSAIDs. -Stay hydrated.  Hypertension Well-controlled with current regimen. BP today 118/74. -Continue current antihypertensive regimen.  Dyslipidemia Slight increase in LDL cholesterol. Patient reports consistent use of Atorvastatin. -Continue Atorvastatin 80mg  daily.  Hypothyroidism No new symptoms reported. -Continue Levothyroxine.  Major Depression recurrent  In remission. Patient reports taking Citalopram daily. -Continue Citalopram 40mg  daily.  General Health Maintenance -Schedule bone density test and mammogram for July 2025. -Consider annual  FIT test or Cologuard for colorectal cancer screening, given patient's age and history of clear colonoscopies. -Follow-up in 4 months (end of April 2025).

## 2023-05-26 ENCOUNTER — Ambulatory Visit: Payer: Medicare HMO | Admitting: Family Medicine

## 2023-05-26 ENCOUNTER — Ambulatory Visit (INDEPENDENT_AMBULATORY_CARE_PROVIDER_SITE_OTHER): Payer: Medicare HMO

## 2023-05-26 DIAGNOSIS — Z780 Asymptomatic menopausal state: Secondary | ICD-10-CM

## 2023-05-26 DIAGNOSIS — Z0000 Encounter for general adult medical examination without abnormal findings: Secondary | ICD-10-CM

## 2023-05-26 NOTE — Progress Notes (Signed)
Subjective:   Megan Short is a 77 y.o. female who presents for Medicare Annual (Subsequent) preventive examination.  Visit Complete: Virtual I connected with  Megan Short on 05/26/23 by a audio enabled telemedicine application and verified that I am speaking with the correct person using two identifiers.  Patient Location: Home  Provider Location: Home Office  I discussed the limitations of evaluation and management by telemedicine. The patient expressed understanding and agreed to proceed.  Vital Signs: Because this visit was a virtual/telehealth visit, some criteria may be missing or patient reported. Any vitals not documented were not able to be obtained and vitals that have been documented are patient reported.   Cardiac Risk Factors include: advanced age (>72men, >22 women);diabetes mellitus;dyslipidemia;hypertension     Objective:    There were no vitals filed for this visit. There is no height or weight on file to calculate BMI.     05/26/2023   10:53 AM 04/22/2022    4:06 PM 04/21/2021    8:52 AM 05/07/2020   12:39 PM 04/01/2020   10:07 AM 03/27/2019   10:05 AM 12/14/2016   10:23 AM  Advanced Directives  Does Patient Have a Medical Advance Directive? No Yes Yes No No Yes No  Type of Science writer of [place];Living will   Healthcare Power of [place];Living will   Does patient want to make changes to medical advance directive?  No - Patient declined       Copy of Healthcare Power of Attorney in Chart?  No - copy requested No - copy requested   No - copy requested   Would patient like information on creating a medical advance directive? No - Patient declined   No - Patient declined Yes (MAU/Ambulatory/Procedural Areas - Information given)      Current Medications (verified) Outpatient Encounter Medications as of 05/26/2023  Medication Sig   Accu-Chek Softclix Lancets lancets Use as instructed   Alcohol Swabs  (DROPSAFE ALCOHOL PREP) 70 % PADS USE AS DIRECTED   atorvastatin (LIPITOR) 80 MG tablet Take 1 tablet (80 mg total) by mouth every evening.   blood glucose meter kit and supplies Dispense based on patient and insurance preference. Check bid daily as directed. (FOR ICD-10 E10.9, E11.9).   cholecalciferol (VITAMIN D3) 25 MCG (1000 UNIT) tablet Take 1 tablet (1,000 Units total) by mouth daily.   citalopram (CELEXA) 40 MG tablet Take 1 tablet (40 mg total) by mouth daily.   clonazePAM (KLONOPIN) 0.5 MG tablet Take 1 tablet (0.5 mg total) by mouth 2 (two) times daily as needed.   glucose blood (ACCU-CHEK GUIDE TEST) test strip TEST BLOOD SUGAR TWICE DAILY AS NEEDED   hydroquinone 4 % cream APPLY  CREAM TOPICALLY TO DARK SPOTS ON LEGS TWICE DAILY TO HELP FADE   levothyroxine (SYNTHROID) 75 MCG tablet Take 1 tablet (75 mcg total) by mouth daily before breakfast.   loratadine (CLARITIN) 10 MG tablet Take 1 tablet (10 mg total) by mouth daily.   losartan (COZAAR) 100 MG tablet Take 1 tablet (100 mg total) by mouth daily.   metFORMIN (GLUCOPHAGE-XR) 750 MG 24 hr tablet Take 2 tablets (1,500 mg total) by mouth daily with breakfast.   Semaglutide 7 MG TABS Take 1 tablet by mouth daily. Take on empty stomach, 30 minutes before the first food, beverage, or other oral medications of the day with 4 oz of plain water only   tacrolimus (PROTOPIC) 0.1 % ointment Apply to affected areas  once to twice daily as directed.   Blood Glucose Calibration (ACCU-CHEK GUIDE CONTROL) LIQD 1 each by In Vitro route once for 1 dose.   Blood Glucose Monitoring Suppl (ACCU-CHEK GUIDE) w/Device KIT 1 each by Does not apply route once for 1 dose.   tiZANidine (ZANAFLEX) 4 MG tablet Take 0.5-1.5 tablets (2-6 mg total) by mouth every 8 (eight) hours as needed for muscle spasms (muscle tightness). (Patient not taking: Reported on 05/26/2023)   No facility-administered encounter medications on file as of 05/26/2023.    Allergies  (verified) Sulfa antibiotics   History: Past Medical History:  Diagnosis Date   Anxiety    Depression    Diabetes mellitus without complication (HCC)    Hyperlipidemia    Hypertension    Hypothyroidism    Past Surgical History:  Procedure Laterality Date   BREAST BIOPSY Right    negative times 2   COLONOSCOPY WITH PROPOFOL N/A 01/20/2015   Procedure: COLONOSCOPY WITH PROPOFOL;  Surgeon: Scot Jun, MD;  Location: [facility] ENDOSCOPY;  Service: Endoscopy;  Laterality: N/A;   COLONOSCOPY WITH PROPOFOL N/A 05/07/2020   Procedure: COLONOSCOPY WITH PROPOFOL;  Surgeon: Toledo, Boykin Nearing, MD;  Location: [facility] ENDOSCOPY;  Service: Gastroenterology;  Laterality: N/A;   LITHOTRIPSY Left 12/2013   Family History  Problem Relation Age of Onset   Osteoarthritis Mother    Kidney failure Mother    Failure to thrive Mother    Osteoarthritis Sister    Alzheimer's disease Maternal Aunt    Breast cancer Neg Hx    Social History   Socioeconomic History   Marital status: Married    Spouse name: Megan Short    Number of children: 0   Years of education: Not on file   Highest education level: Some college, no degree  Occupational History   Occupation: retired     Comment: Clinical biochemist rep   Tobacco Use   Smoking status: Never   Smokeless tobacco: Never   Tobacco comments:    Smoking cessation materials not required  Vaping Use   Vaping status: Never Used  Substance and Sexual Activity   Alcohol use: No    Alcohol/week: 0.0 standard drinks of alcohol   Drug use: No   Sexual activity: Not Currently  Other Topics Concern   Not on file  Social History Narrative   Married, they had one son that died with complications of lupus   Social Drivers of Corporate investment banker Strain: Low Risk  (05/26/2023)   Overall Financial Resource Strain (CARDIA)    Difficulty of Paying Living Expenses: Not hard at all  Food Insecurity: No Food Insecurity (05/26/2023)   Hunger Vital Sign     Worried About Running Out of Food in the Last Year: Never true    Ran Out of Food in the Last Year: Never true  Transportation Needs: No Transportation Needs (05/26/2023)   PRAPARE - Administrator, Civil Service (Medical): No    Lack of Transportation (Non-Medical): No  Physical Activity: Sufficiently Active (05/26/2023)   Exercise Vital Sign    Days of Exercise per Week: 5 days    Minutes of Exercise per Session: 60 min  Stress: No Stress Concern Present (05/26/2023)   [employer] of Occupational Health - Occupational Stress Questionnaire    Feeling of Stress : Not at all  Social Connections: Moderately Isolated (05/26/2023)   Social Connection and Isolation Panel [NHANES]    Frequency of Communication with Friends and Family: More  than three times a week    Frequency of Social Gatherings with Friends and Family: More than three times a week    Attends Religious Services: Never    Database administrator or Organizations: No    Attends Engineer, structural: Never    Marital Status: Married    Tobacco Counseling Counseling given: Not Answered Tobacco comments: Smoking cessation materials not required   Clinical Intake:  Pre-visit preparation completed: Yes  Pain : No/denies pain     BMI - recorded: 28.2 Nutritional Status: BMI 25 -29 Overweight Nutritional Risks: None Diabetes: Yes CBG done?: No Did pt. bring in CBG monitor from home?: No  How often do you need to have someone help you when you read instructions, pamphlets, or other written materials from your doctor or pharmacy?: 1 - Never  Interpreter Needed?: No  Information entered by :: Kennedy Bucker, LPN   Activities of Daily Living    05/26/2023   10:54 AM 04/07/2023   11:29 AM  In your present state of health, do you have any difficulty performing the following activities:  Hearing? 0 0  Vision? 0 0  Difficulty concentrating or making decisions? 0 0  Walking or climbing  stairs? 0 0  Dressing or bathing? 0 0  Doing errands, shopping? 0 0  Preparing Food and eating ? N   Using the Toilet? N   In the past six months, have you accidently leaked urine? N   Do you have problems with loss of bowel control? N   Managing your Medications? N   Managing your Finances? N   Housekeeping or managing your Housekeeping? N     Patient Care Team: Alba Cory, MD as PCP - General (Family Medicine) Helane Gunther, DPM as Consulting Physician (Podiatry) Inis Sizer, MD as Consulting Physician (Dermatology) Ronney Asters, Jackelyn Poling, RPH-CPP as Pharmacist Lockie Mola, MD as Referring Physician (Ophthalmology)  Indicate any recent Medical Services you may have received from other than [facility] providers in the past year (date may be approximate).     Assessment:   This is a routine wellness examination for Areliz.  Hearing/Vision screen Hearing Screening - Comments:: NO AIDS Vision Screening - Comments:: WEARS GLASSES- DR.BRASINGTON   Goals Addressed             This Visit's Progress    DIET - REDUCE SUGAR INTAKE         Depression Screen    05/26/2023   10:51 AM 04/07/2023   11:29 AM 01/26/2023    9:38 AM 09/29/2022    2:32 PM 05/18/2022    9:29 AM 04/22/2022    1:56 PM 01/14/2022    9:59 AM  PHQ 2/9 Scores  PHQ - 2 Score 0 0 0 0 0 0 0  PHQ- 9 Score 0 0 0 0 0  0    Fall Risk    05/26/2023   10:54 AM 04/07/2023   11:27 AM 01/26/2023    9:38 AM 09/29/2022    2:31 PM 05/18/2022    9:29 AM  Fall Risk   Falls in the past year? 0 0 0 0 0  Number falls in past yr: 0 0  0   Injury with Fall? 0 0  0   Risk for fall due to : No Fall Risks  No Fall Risks No Fall Risks No Fall Risks  Follow up Falls prevention discussed;Falls evaluation completed  Falls prevention discussed Falls prevention discussed;Education provided;Falls evaluation completed Falls prevention discussed;Education  provided;Falls evaluation completed    MEDICARE RISK AT  HOME: Medicare Risk at Home Any stairs in or around the home?: No If so, are there any without handrails?: No Home free of loose throw rugs in walkways, pet beds, electrical cords, etc?: Yes Adequate lighting in your home to reduce risk of falls?: Yes Life alert?: No Use of a cane, walker or w/c?: No Grab bars in the bathroom?: Yes Shower chair or bench in shower?: Yes Elevated toilet seat or a handicapped toilet?: Yes  TIMED UP AND GO:  Was the test performed?  No    Cognitive Function:        05/26/2023   10:55 AM 04/22/2022    1:57 PM 04/01/2020   10:09 AM 03/27/2019   10:08 AM 03/24/2018   11:01 AM  6CIT Screen  What Year? 0 points 0 points 0 points 0 points 0 points  What month? 0 points 0 points 0 points 0 points 0 points  What time? 0 points 0 points 0 points 0 points 0 points  Count back from 20 0 points 0 points 0 points 0 points 0 points  Months in reverse 0 points 0 points 0 points 0 points 0 points  Repeat phrase 0 points 0 points 0 points 0 points 4 points  Total Score 0 points 0 points 0 points 0 points 4 points    Immunizations Immunization History  Administered Date(s) Administered   Fluad Quad(high Dose 65+) 03/07/2019, 04/01/2020   Influenza Split 01/30/2014   Influenza, High Dose Seasonal PF 02/12/2015, 05/13/2016, 03/23/2017, 03/21/2018, 04/16/2021, 03/10/2022   Influenza-Unspecified 03/07/2023   Moderna Covid-19 Vaccine Bivalent Booster 40yrs & up 03/30/2022   PFIZER(Purple Top)SARS-COV-2 Vaccination 06/29/2019, 07/20/2019, 03/01/2020, 10/24/2020   Pfizer(Comirnaty)Fall Seasonal Vaccine 12 years and older 03/07/2023   Pneumococcal Conjugate-13 08/08/2014   Pneumococcal Polysaccharide-23 05/27/2010, 09/10/2015   Respiratory Syncytial Virus Vaccine,Recomb Aduvanted(Arexvy) 03/10/2022   Tdap 05/27/2010, 08/01/2020   Zoster Recombinant(Shingrix) 07/18/2021, 10/09/2021   Zoster, Live 05/31/2011    TDAP status: Up to date  Flu Vaccine status: Up  to date  Pneumococcal vaccine status: Up to date  Covid-19 vaccine status: Completed vaccines  Qualifies for Shingles Vaccine? Yes   Zostavax completed Yes   Shingrix Completed?: Yes  Screening Tests Health Maintenance  Topic Date Due   COVID-19 Vaccine (7 - 2024-25 season) 05/02/2023   HEMOGLOBIN A1C  07/29/2023   OPHTHALMOLOGY EXAM  08/03/2023   MAMMOGRAM  12/20/2023   Diabetic kidney evaluation - eGFR measurement  01/26/2024   Diabetic kidney evaluation - Urine ACR  01/26/2024   FOOT EXAM  01/26/2024   Medicare Annual Wellness (AWV)  05/25/2024   Colonoscopy  05/07/2025   DTaP/Tdap/Td (3 - Td or Tdap) 08/01/2030   Pneumonia Vaccine 41+ Years old  Completed   INFLUENZA VACCINE  Completed   DEXA SCAN  Completed   Hepatitis C Screening  Completed   Zoster Vaccines- Shingrix  Completed   HPV VACCINES  Aged Out    Health Maintenance  Health Maintenance Due  Topic Date Due   COVID-19 Vaccine (7 - 2024-25 season) 05/02/2023    Colorectal cancer screening: No longer required.   Mammogram status: Completed 12/20/22. Repeat every year  Bone Density status: Ordered 05/26/23. Pt provided with contact info and advised to call to schedule appt.  Lung Cancer Screening: (Low Dose CT Chest recommended if Age 38-80 years, 20 pack-year currently smoking OR have quit w/in 15years.) does not qualify.    Additional Screening:  Hepatitis  C Screening: does qualify; Completed 03/07/12  Vision Screening: Recommended annual ophthalmology exams for early detection of glaucoma and other disorders of the eye. Is the patient up to date with their annual eye exam?  Yes  Who is the provider or what is the name of the office in which the patient attends annual eye exams? DR.BRASINGTON If pt is not established with a provider, would they like to be referred to a provider to establish care? No .   Dental Screening: Recommended annual dental exams for proper oral hygiene  Diabetic Foot Exam:  Diabetic Foot Exam: Completed 01/26/23  Community Resource Referral / Chronic Care Management: CRR required this visit?  No   CCM required this visit?  No     Plan:     I have personally reviewed and noted the following in the patient's chart:   Medical and social history Use of alcohol, tobacco or illicit drugs  Current medications and supplements including opioid prescriptions. Patient is not currently taking opioid prescriptions. Functional ability and status Nutritional status Physical activity Advanced directives List of other physicians Hospitalizations, surgeries, and ER visits in previous 12 months Vitals Screenings to include cognitive, depression, and falls Referrals and appointments  In addition, I have reviewed and discussed with patient certain preventive protocols, quality metrics, and best practice recommendations. A written personalized care plan for preventive services as well as general preventive health recommendations were provided to patient.     Hal Hope, LPN   75/[id number]/[id number]   After Visit Summary: (MyChart) Due to this being a telephonic visit, the after visit summary with patients personalized plan was offered to patient via MyChart   Nurse Notes: DEXA REFERRAL SENT

## 2023-05-26 NOTE — Patient Instructions (Addendum)
Megan Short , Thank you for taking time to come for your Medicare Wellness Visit. I appreciate your ongoing commitment to your health goals. Please review the following plan we discussed and let me know if I can assist you in the future.   Referrals/Orders/Follow-Ups/Clinician Recommendations: BONE DENSITY SCAN REFERRAL DONE  You have an order for:  []   2D Mammogram  []   3D Mammogram  [x]   Bone Density     Please call for appointment:  [facility]  [address] [place] [address] [phone]   Make sure to wear two-piece clothing.  No lotions, powders, or deodorants the day of the appointment. Make sure to bring picture ID and insurance card.  Bring list of medications you are currently taking including any supplements.   Schedule your [place] screening mammogram through MyChart!   Log into your MyChart account.  Go to 'Visit' (or 'Appointments' if on mobile App) --> Schedule an Appointment  Under 'Select a Reason for Visit' choose the Mammogram Screening option.  Complete the pre-visit questions and select the time and place that best fits your schedule.    This is a list of the screening recommended for you and due dates:  Health Maintenance  Topic Date Due   COVID-19 Vaccine (7 - 2024-25 season) 05/02/2023   Hemoglobin A1C  07/29/2023   Eye exam for diabetics  08/03/2023   Mammogram  12/20/2023   Yearly kidney function blood test for diabetes  01/26/2024   Yearly kidney health urinalysis for diabetes  01/26/2024   Complete foot exam   01/26/2024   Medicare Annual Wellness Visit  05/25/2024   Colon Cancer Screening  05/07/2025   DTaP/Tdap/Td vaccine (3 - Td or Tdap) 08/01/2030   Pneumonia Vaccine  Completed   Flu Shot  Completed   DEXA scan (bone density measurement)  Completed   Hepatitis C Screening  Completed   Zoster (Shingles) Vaccine  Completed   HPV Vaccine  Aged Out    Advanced directives: (ACP  Link)Information on Advanced Care Planning can be found at [facility] [place] Advance Health Care Directives Advance Health Care Directives ([email or web address]   Next Medicare Annual Wellness Visit scheduled for next year: Yes   06/14/24 @ 10:10 AM BY VIDEO

## 2023-05-27 ENCOUNTER — Ambulatory Visit: Payer: Medicare HMO | Admitting: Family Medicine

## 2023-05-27 ENCOUNTER — Encounter: Payer: Self-pay | Admitting: Family Medicine

## 2023-05-27 VITALS — BP 118/74 | HR 97 | Resp 18 | Ht 59.5 in | Wt 132.4 lb

## 2023-05-27 DIAGNOSIS — E039 Hypothyroidism, unspecified: Secondary | ICD-10-CM

## 2023-05-27 DIAGNOSIS — E785 Hyperlipidemia, unspecified: Secondary | ICD-10-CM | POA: Diagnosis not present

## 2023-05-27 DIAGNOSIS — I152 Hypertension secondary to endocrine disorders: Secondary | ICD-10-CM

## 2023-05-27 DIAGNOSIS — E538 Deficiency of other specified B group vitamins: Secondary | ICD-10-CM | POA: Diagnosis not present

## 2023-05-27 DIAGNOSIS — E559 Vitamin D deficiency, unspecified: Secondary | ICD-10-CM | POA: Diagnosis not present

## 2023-05-27 DIAGNOSIS — E1159 Type 2 diabetes mellitus with other circulatory complications: Secondary | ICD-10-CM | POA: Diagnosis not present

## 2023-05-27 DIAGNOSIS — R809 Proteinuria, unspecified: Secondary | ICD-10-CM | POA: Diagnosis not present

## 2023-05-27 DIAGNOSIS — N1831 Chronic kidney disease, stage 3a: Secondary | ICD-10-CM | POA: Diagnosis not present

## 2023-05-27 DIAGNOSIS — F325 Major depressive disorder, single episode, in full remission: Secondary | ICD-10-CM

## 2023-05-27 DIAGNOSIS — Z7984 Long term (current) use of oral hypoglycemic drugs: Secondary | ICD-10-CM

## 2023-05-27 DIAGNOSIS — E1129 Type 2 diabetes mellitus with other diabetic kidney complication: Secondary | ICD-10-CM

## 2023-05-27 LAB — POCT GLYCOSYLATED HEMOGLOBIN (HGB A1C): Hemoglobin A1C: 5.8 % — AB (ref 4.0–5.6)

## 2023-05-27 MED ORDER — LEVOTHYROXINE SODIUM 75 MCG PO TABS
75.0000 ug | ORAL_TABLET | Freq: Every day | ORAL | 1 refills | Status: DC
Start: 2023-05-27 — End: 2023-10-14

## 2023-05-27 MED ORDER — ATORVASTATIN CALCIUM 80 MG PO TABS
80.0000 mg | ORAL_TABLET | Freq: Every evening | ORAL | 1 refills | Status: DC
Start: 2023-05-27 — End: 2023-12-20

## 2023-05-27 MED ORDER — METFORMIN HCL ER 750 MG PO TB24: 750.0000 mg | ORAL_TABLET | Freq: Every day | ORAL | 1 refills | Status: DC

## 2023-05-27 MED ORDER — LOSARTAN POTASSIUM 100 MG PO TABS: 100.0000 mg | ORAL_TABLET | Freq: Every day | ORAL | 1 refills | Status: DC

## 2023-05-27 MED ORDER — CITALOPRAM HYDROBROMIDE 40 MG PO TABS
40.0000 mg | ORAL_TABLET | Freq: Every day | ORAL | 1 refills | Status: DC
Start: 2023-05-27 — End: 2023-10-13

## 2023-06-29 ENCOUNTER — Other Ambulatory Visit: Payer: Self-pay | Admitting: Pharmacist

## 2023-06-29 DIAGNOSIS — E1129 Type 2 diabetes mellitus with other diabetic kidney complication: Secondary | ICD-10-CM

## 2023-06-29 NOTE — Progress Notes (Signed)
06/29/2023 Name: Megan Short MRN: [id number] DOB: 04/27/1946  Chief Complaint  Patient presents with   Medication Assistance    Megan Short is a 78 y.o. year old female who presented for a telephone visit.   They were self-referred to the pharmacist for assistance in managing medication access.      Subjective:   Care Team: Primary Care Provider: Alba Cory, MD ; Next Scheduled Visit: 10/13/2023   Medication Access/Adherence  Current Pharmacy:  [facility] [address]), [place] - [address] [place] ([place]) [place] [address] Phone: [phone] Fax: [phone]  [facility] - [place], [place] - [address] [place] [address] Phone: [phone] Fax: [phone]  [facility] - [place], [place] - [address] [place] [address] Phone: [phone] Fax: [phone]   Patient reports affordability concerns with their medications: No  Patient reports access/transportation concerns to their pharmacy: No  Patient reports adherence concerns with their medications:  No       Diabetes:   Current medications:  - metformin ER 750 mg daily - Rybelsus 7 mg daily Confirms takes on empty stomach, >=30 minutes before the first food, beverage, or other oral medications of the day with <=4 oz of plain water only Reports tolerating well; not currently having nausea   Medications tried in the past: pioglitazone   Current glucose readings: morning fasting ranging: 86-106   Denies symptoms of hypoglycemia such as shaking, sweating or dizziness   Current physical activity: Goes to gym (treadmill, aerobics and light weights) at least 90 minutes Monday-Friday   Statin therapy: atorvastatin 80 mg daily   Current medication access support: collaborating with PCP and CPhT to aid patient with pursing re-enrollment in patient assistance  for Rybelsus from [employer] for 2025 calendar year   Reports has >1 month supply of Rybelsus remaining  Reports has completed re-enrollment application, but had not yet mailed back to CPhT as was waiting on her proof of income document. Expects to receive this document this week and then mail application back.   Objective:  Lab Results  Component Value Date   HGBA1C 5.8 (A) 05/27/2023    Lab Results  Component Value Date   CREATININE 1.06 (H) 01/26/2023   BUN 26 (H) 01/26/2023   NA 138 01/26/2023   K 4.6 01/26/2023   CL 105 01/26/2023   CO2 24 01/26/2023    Lab Results  Component Value Date   CHOL 137 01/26/2023   HDL 43 (L) 01/26/2023   LDLCALC 78 01/26/2023   TRIG 76 01/26/2023   CHOLHDL 3.2 01/26/2023    Medications Reviewed Today     Reviewed by Manuela Neptune, RPH-CPP (Pharmacist) on 06/29/23 at 0915  Med List Status: <None>   Medication Order Taking? Sig Documenting Provider Last Dose Status Informant  Accu-Chek Softclix Lancets lancets [id number]  Use as instructed Alba Cory, MD  Active   Alcohol Swabs (DROPSAFE ALCOHOL PREP) 70 % PADS [id number]  USE AS DIRECTED Alba Cory, MD  Active   atorvastatin (LIPITOR) 80 MG tablet [id number]  Take 1 tablet (80 mg total) by mouth every evening. Alba Cory, MD  Active   Blood Glucose Calibration (ACCU-CHEK GUIDE CONTROL) LIQD [id number]  1 each by In Vitro route once for 1 dose. Alba Cory, MD  Expired 09/20/22 2359   blood glucose meter kit and supplies [id number]  Dispense based  on patient and insurance preference. Check bid daily as directed. (FOR ICD-10 E10.9, E11.9). Alba Cory, MD  Active   Blood Glucose Monitoring Suppl (ACCU-CHEK GUIDE) w/Device KIT [id number]  1 each by Does not apply route once for 1 dose. Alba Cory, MD  Expired 09/20/22 2359   cholecalciferol (VITAMIN D3) 25 MCG (1000 UNIT) tablet [id number]  Take 1 tablet (1,000 Units total) by mouth daily. Alba Cory, MD   Active   citalopram (CELEXA) 40 MG tablet [id number]  Take 1 tablet (40 mg total) by mouth daily. Alba Cory, MD  Active   clonazePAM Scarlette Calico) 0.5 MG tablet [id number]  Take 1 tablet (0.5 mg total) by mouth 2 (two) times daily as needed. Alba Cory, MD  Active   glucose blood (ACCU-CHEK GUIDE TEST) test strip [id number]  TEST BLOOD SUGAR TWICE DAILY AS NEEDED Alba Cory, MD  Active   hydroquinone 4 % cream [id number]  APPLY  CREAM TOPICALLY TO DARK SPOTS ON LEGS TWICE DAILY TO HELP Darrol Poke, MD  Active   levothyroxine (SYNTHROID) 75 MCG tablet [id number]  Take 1 tablet (75 mcg total) by mouth daily before breakfast. Alba Cory, MD  Active   loratadine (CLARITIN) 10 MG tablet [id number]  Take 1 tablet (10 mg total) by mouth daily. Alba Cory, MD  Active   losartan (COZAAR) 100 MG tablet [id number]  Take 1 tablet (100 mg total) by mouth daily. Alba Cory, MD  Active   metFORMIN (GLUCOPHAGE-XR) 750 MG 24 hr tablet [id number] Yes Take 1 tablet (750 mg total) by mouth daily with breakfast. Alba Cory, MD Taking Active   Semaglutide 7 MG TABS [id number] Yes Take 1 tablet by mouth daily. Take on empty stomach, 30 minutes before the first food, beverage, or other oral medications of the day with 4 oz of plain water only [provider] Taking Active   tacrolimus (PROTOPIC) 0.1 % ointment [id number]  Apply to affected areas once to twice daily as directed. Willeen Niece, MD  Active   tiZANidine (ZANAFLEX) 4 MG tablet [id number]  Take 0.5-1.5 tablets (2-6 mg total) by mouth every 8 (eight) hours as needed for muscle spasms (muscle tightness). Berniece Salines, FNP  Active               Assessment/Plan:   Diabetes: - Reviewed goal A1c, goal fasting, and goal 2 hour post prandial glucose - Encourage patient to have regular well-balanced meals while controlling carbohydrate portion sizes - Recommend to continue to check glucose, keep log of results and have  this record to review during upcoming medical appointments - Collaborating with PCP and CPhT to aid patient with pursing re-enrollment in patient assistance for Rybelsus from [employer] for 2025 calendar year - Remind patient to follow up with [employer] patient assistance program as needed for refills of Rybelsus   Follow Up Plan: Clinical Pharmacist will outreach to patient by telephone again on 08/03/2023 at 8:30 AM   Estelle Grumbles, PharmD, [facility] [phone]

## 2023-06-29 NOTE — Patient Instructions (Signed)
Goals Addressed             This Visit's Progress    Pharmacy Goals       Our goal A1c is less than 7%. This corresponds with fasting sugars less than 130 and 2 hour after meal sugars less than 180. Please keep a log of your results when checking your blood sugar   Our goal bad cholesterol, or LDL, is less than 70. This is why it is important to continue taking your atorvastatin.  Please mail your completed patient assistance program application back to Pharmacy Technician Noreene Larsson [place] along with a copy of your Medicare Part D prescription card and a copy of your proof of income document OR you can bring these documents to the office to have them faxed back to Attention: [name] at Fax # [phone]   If you need to call Noreene Larsson, you can reach her at [phone]  If you need to reach out to patient assistance programs regarding refills or to find out the status of your application, you can do so by calling:  [employer] at [phone]   Thank you!  Estelle Grumbles, PharmD, [facility] [phone]

## 2023-07-27 ENCOUNTER — Telehealth: Payer: Self-pay | Admitting: Pharmacy Technician

## 2023-07-27 DIAGNOSIS — Z5986 Financial insecurity: Secondary | ICD-10-CM

## 2023-07-27 NOTE — Progress Notes (Signed)
Pharmacy Medication Assistance Program Note    07/27/2023  Patient ID: Megan Short, female   DOB: [birth year], 78 y.o.   MRN: [id number]     07/27/2023  Outreach Medication One  Initial Outreach Date (Medication One) 04/05/2023  Manufacturer Medication One [employer] Drugs Rybelsus  Dose of Rybelsus 7mg   Type of Radiographer, therapeutic Assistance  Date Application Sent to Patient 04/06/2023  Application Items Requested Application;Proof of Income;Other  Date Application Sent to Prescriber 04/06/2023  Name of Prescriber Alba Cory  Date Application Received From Patient 07/21/2023  Application Items Received From Patient Application;Proof of Income;Other  Date Application Received From Provider 04/08/2023  Date Application Submitted to Manufacturer 07/27/2023  Method Application Sent to Manufacturer Fax    Submitted app.  [name], CPhT [place]  Office: [phone] Fax: [phone] Email: [email or web address]

## 2023-08-03 ENCOUNTER — Other Ambulatory Visit: Payer: Self-pay | Admitting: Pharmacist

## 2023-08-03 DIAGNOSIS — R809 Proteinuria, unspecified: Secondary | ICD-10-CM

## 2023-08-03 NOTE — Progress Notes (Unsigned)
 08/03/2023 Name: Megan Short MRN: [id number] DOB: 03-23-1946  Chief Complaint  Patient presents with   Medication Management   Medication Assistance    Megan Short is a 78 y.o. year old female who presented for a telephone visit.   They were self-referred to the pharmacist for assistance in managing medication access.      Subjective:   Care Team: Primary Care Provider: Alba Cory, MD ; Next Scheduled Visit: 10/13/2023  Medication Access/Adherence  Current Pharmacy:  [facility] [address] ([address]), [place] - [address] [place] (N) [place] [address] Phone: [phone] Fax: [phone]  [facility] Delivery - [place], [place] - [address] [place] [address] Phone: [phone] Fax: [phone]  [facility] Mail Delivery - [place], [place] - [address] [place] [address] Phone: [phone] Fax: [phone]   Patient reports affordability concerns with their medications: No  Patient reports access/transportation concerns to their pharmacy: No  Patient reports adherence concerns with their medications:  No       Diabetes:   Current medications:  - metformin ER 750 mg daily - Rybelsus 7 mg daily Confirms takes on empty stomach, >=30 minutes before the first food, beverage, or other oral medications of the day with <=4 oz of plain water only Reports tolerating well now   Medications tried in the past: pioglitazone   Current glucose readings: morning fasting ranging: 88-101   Denies symptoms of hypoglycemia such as shaking, sweating or dizziness   Current physical activity: Goes to gym (treadmill, aerobics and light weights) at least 90 minutes Monday-Friday   Statin therapy: atorvastatin 80 mg daily   Current medication access support: collaborating with PCP and CPhT to aid patient with pursing re-enrollment in patient assistance  for Rybelsus from [employer] for 2025 calendar year              Reports has ~20 tablets of Rybelsus remaining  Note CPhT has received application back from patient and application faxed to [employer] on 07/27/2023    Objective:  Lab Results  Component Value Date   HGBA1C 5.8 (A) 05/27/2023    Lab Results  Component Value Date   CREATININE 1.06 (H) 01/26/2023   BUN 26 (H) 01/26/2023   NA 138 01/26/2023   K 4.6 01/26/2023   CL 105 01/26/2023   CO2 24 01/26/2023    Lab Results  Component Value Date   CHOL 137 01/26/2023   HDL 43 (L) 01/26/2023   LDLCALC 78 01/26/2023   TRIG 76 01/26/2023   CHOLHDL 3.2 01/26/2023    Medications Reviewed Today     Reviewed by Manuela Neptune, RPH-CPP (Pharmacist) on 08/03/23 at 0855  Med List Status: <None>   Medication Order Taking? Sig Documenting Provider Last Dose Status Informant  Accu-Chek Softclix Lancets lancets [id number]  Use as instructed Alba Cory, MD  Active   Alcohol Swabs (DROPSAFE ALCOHOL PREP) 70 % PADS [id number]  USE AS DIRECTED Alba Cory, MD  Active   atorvastatin (LIPITOR) 80 MG tablet [id number]  Take 1 tablet (80 mg total) by mouth every evening. Alba Cory, MD  Active   Blood Glucose Calibration (ACCU-CHEK GUIDE CONTROL) LIQD [id number]  1 each by In Vitro route once for 1 dose. Alba Cory, MD  Expired 09/20/22 2359   blood glucose meter kit and supplies [id number]  Dispense based on patient and insurance preference. Check bid  daily as directed. (FOR ICD-10 E10.9, E11.9). Alba Cory, MD  Active   Blood Glucose Monitoring Suppl (ACCU-CHEK GUIDE) w/Device KIT [id number]  1 each by Does not apply route once for 1 dose. Alba Cory, MD  Expired 09/20/22 2359   cholecalciferol (VITAMIN D3) 25 MCG (1000 UNIT) tablet [id number]  Take 1 tablet (1,000 Units total) by mouth daily. Alba Cory, MD  Active   citalopram (CELEXA) 40 MG tablet [id number]  Take 1 tablet (40 mg total) by mouth daily.  Alba Cory, MD  Active   clonazePAM Scarlette Calico) 0.5 MG tablet [id number]  Take 1 tablet (0.5 mg total) by mouth 2 (two) times daily as needed. Alba Cory, MD  Active   glucose blood (ACCU-CHEK GUIDE TEST) test strip [id number]  TEST BLOOD SUGAR TWICE DAILY AS NEEDED Alba Cory, MD  Active   hydroquinone 4 % cream [id number]  APPLY  CREAM TOPICALLY TO DARK SPOTS ON LEGS TWICE DAILY TO HELP Darrol Poke, MD  Active   levothyroxine (SYNTHROID) 75 MCG tablet [id number]  Take 1 tablet (75 mcg total) by mouth daily before breakfast. Alba Cory, MD  Active   loratadine (CLARITIN) 10 MG tablet [id number]  Take 1 tablet (10 mg total) by mouth daily. Alba Cory, MD  Active   losartan (COZAAR) 100 MG tablet [id number]  Take 1 tablet (100 mg total) by mouth daily. Alba Cory, MD  Active   metFORMIN (GLUCOPHAGE-XR) 750 MG 24 hr tablet [id number] Yes Take 1 tablet (750 mg total) by mouth daily with breakfast. Alba Cory, MD Taking Active   Semaglutide 7 MG TABS [id number] Yes Take 1 tablet by mouth daily. Take on empty stomach, 30 minutes before the first food, beverage, or other oral medications of the day with 4 oz of plain water only [provider] Taking Active   tacrolimus (PROTOPIC) 0.1 % ointment [id number]  Apply to affected areas once to twice daily as directed. Willeen Niece, MD  Active   tiZANidine (ZANAFLEX) 4 MG tablet [id number]  Take 0.5-1.5 tablets (2-6 mg total) by mouth every 8 (eight) hours as needed for muscle spasms (muscle tightness). Berniece Salines, FNP  Active               Assessment/Plan:   Diabetes: - Reviewed goal A1c, goal fasting, and goal 2 hour post prandial glucose - Encourage patient to have regular well-balanced meals while controlling carbohydrate portion sizes - Recommend to continue to check glucose, keep log of results and have this record to review during upcoming medical appointments - Remind patient to follow up with [employer] patient assistance program as needed for refills of Rybelsus  Patient to call [employer] to find out if her application has been approved      Follow Up Plan: Clinical Pharmacist will outreach to patient by telephone again on 08/31/2023 at 9:00 AM    Estelle Grumbles, PharmD, [facility] [phone]

## 2023-08-04 NOTE — Patient Instructions (Signed)
 Goals Addressed             This Visit's Progress    Pharmacy Goals       Our goal A1c is less than 7%. This corresponds with fasting sugars less than 130 and 2 hour after meal sugars less than 180. Please keep a log of your results when checking your blood sugar   Our goal bad cholesterol, or LDL, is less than 70. This is why it is important to continue taking your atorvastatin.  If you need to reach out to patient assistance programs regarding refills or to find out the status of your application, you can do so by calling:  [employer] at [phone]   Thank you!  Estelle Grumbles, PharmD, [facility] [phone]

## 2023-08-08 ENCOUNTER — Other Ambulatory Visit: Payer: Self-pay | Admitting: Family Medicine

## 2023-08-08 NOTE — Telephone Encounter (Unsigned)
 Copied from CRM [id number]. Topic: Clinical - Medication Refill >> Aug 08, 2023  8:34 AM Everette Rank wrote: Most Recent Primary Care Visit:  Provider: Alba Cory  Department: [facility]  Visit Type: OFFICE VISIT  Date: 05/27/2023  Medication: Rybelsus 7 mg daily  Has the patient contacted their pharmacy? No (Agent: If no, request that the patient contact the pharmacy for the refill. If patient does not wish to contact the pharmacy document the reason why and proceed with request.) (Agent: If yes, when and what did the pharmacy advise?)  Is this the correct pharmacy for this prescription? No If no, delete pharmacy and type the correct one.  This is the patient's preferred pharmacy:  [facility] [address] (N), [place] - [address] [place] [place] ([place]) [place] [address] Phone: [phone] Fax: [phone]  [facility] Delivery - [place], [place] - [address] [place] [address] Phone: [phone] Fax: [phone]  [facility] Mail Delivery - [place], [place] - [address] [place] [address] Phone: [phone] Fax: [phone]   Has the prescription been filled recently? No Aug 2025  Is the patient out of the medication? No  Has the patient been seen for an appointment in the last year OR does the patient have an upcoming appointment? Yes  Can we respond through MyChart? Yes  Agent: Please be advised that Rx refills may take up to 3 business days. We ask that you follow-up with your pharmacy.

## 2023-08-09 ENCOUNTER — Telehealth: Payer: Self-pay | Admitting: Pharmacy Technician

## 2023-08-09 DIAGNOSIS — Z5986 Financial insecurity: Secondary | ICD-10-CM

## 2023-08-09 LAB — HM DIABETES EYE EXAM

## 2023-08-09 NOTE — Progress Notes (Signed)
 Pharmacy Medication Assistance Program Note    08/09/2023  Patient ID: Megan Short, female   DOB: [birth year], 78 y.o.   MRN: [id number]     07/27/2023 08/09/2023  Outreach Medication One  Initial Outreach Date (Medication One) 04/05/2023   Manufacturer Medication One [employer] Drugs Rybelsus   Dose of Rybelsus 7mg    Type of Radiographer, therapeutic Assistance   Date Application Sent to Patient 04/06/2023   Application Items Requested Application;Proof of Income;Other   Date Application Sent to Prescriber 04/06/2023   Name of Prescriber Alba Cory   Date Application Received From Patient 07/21/2023   Application Items Received From Patient Application;Proof of Income;Other   Date Application Received From Provider 04/08/2023   Date Application Submitted to Manufacturer 07/27/2023   Method Application Sent to Manufacturer Fax   Patient Assistance Determination  Approved  Approval Start Date  07/28/2023  Approval End Date  06/06/2024  Additional Outreach Contact  Provider  Contacted Provider  Message     Care coordination call placed to [employer] in regard to Rybelsus application.  Per [employer] IVR system, patient is APPROVED 07/28/23-06/06/24. Initial medication orders and subsequent refill orders will process automatically and be delivered to the prescriber's office. Patient may call [employer] at any time to check on shipments by calling [phone].   Will send in basket message to [facility] Pharmacists Estelle Grumbles requesting she notify patient of her approval at their next scheduled office visit  as well as case closure as patient assistance is completed.  [name], CPhT [place]  Office: [phone] Fax: [phone] Email: [email or web address]

## 2023-08-11 ENCOUNTER — Other Ambulatory Visit: Payer: Self-pay | Admitting: Family Medicine

## 2023-08-11 DIAGNOSIS — Z1231 Encounter for screening mammogram for malignant neoplasm of breast: Secondary | ICD-10-CM

## 2023-08-19 ENCOUNTER — Other Ambulatory Visit: Payer: Self-pay | Admitting: Family Medicine

## 2023-08-19 ENCOUNTER — Other Ambulatory Visit: Payer: Self-pay | Admitting: Pharmacist

## 2023-08-19 ENCOUNTER — Telehealth: Payer: Self-pay

## 2023-08-19 DIAGNOSIS — E1129 Type 2 diabetes mellitus with other diabetic kidney complication: Secondary | ICD-10-CM

## 2023-08-19 MED ORDER — RYBELSUS 7 MG PO TABS: 7.0000 mg | ORAL_TABLET | Freq: Every day | ORAL | 0 refills | Status: DC

## 2023-08-19 NOTE — Progress Notes (Signed)
   08/19/2023  Patient ID: Megan Short, female   DOB: 18-Sep-1945, 78 y.o.   MRN: [id number]   Receive a call from patient requesting a call back regarding Rybelsus patient assistance.  Return call to patient. Reports that she/office have not yet received a shipment of her Rybelsus from [employer] patient assistance program and she is running low on her supply. States that she has already [name] and received a voucher for a 50-month supply from program, but needs an active prescription at her local pharmacy in order to use this voucher. States that she currently has 1 week supply of her Rybelsus 7 mg remaining  Collaborate with PCP to request provider send a 38-month supply prescription for Rybelsus 7 mg to [facility] for patient. - Prescription sent by provider - Follow up with patient to provide update  Estelle Grumbles, PharmD, [facility] [phone]

## 2023-08-19 NOTE — Telephone Encounter (Signed)
 Copied from CRM [id number]. Topic: Clinical - Medication Refill >> Aug 19, 2023 10:48 AM Gery Pray wrote: Most Recent Primary Care Visit:  Provider: Alba Cory  Department: [facility]  Visit Type: OFFICE VISIT  Date: 05/27/2023  Medication: Semaglutide 7 MG TABS    Has the patient contacted their pharmacy? Yes (Agent: If no, request that the patient contact the pharmacy for the refill. If patient does not wish to contact the pharmacy document the reason why and proceed with request.) (Agent: If yes, when and what did the pharmacy advise?) Advised to reach to provider for 30 day supply as patient has a voucher   Is this the correct pharmacy for this prescription? Yes If no, delete pharmacy and type the correct one.  This is the patient's preferred pharmacy:  [facility] [address] ([address]),  - [address] [place]) [place] [address] Phone: [phone] Fax: [phone]  Has the prescription been filled recently? No  Is the patient out of the medication? No  Has the patient been seen for an appointment in the last year OR does the patient have an upcoming appointment? Yes  Can we respond through MyChart? Yes  Agent: Please be advised that Rx refills may take up to 3 business days. We ask that you follow-up with your pharmacy.

## 2023-08-19 NOTE — Telephone Encounter (Signed)
 Copied from CRM [id number]. Topic: Clinical - Prescription Issue >> Aug 19, 2023 10:35 AM Gery Pray wrote: Reason for CRM: Enid Derry from [employer] Patient Assistance Program calling to have a 30 day prescription sent to the patient's pharmacy for  rybelsus 7 MG tablet (Semaglutide 7 MG TABS). Pharmacy is [facility] [address] ([address]), [place] - [address] ([address]) [place] [address] Phone: [phone] Fax: [phone] Patient's contact information:  [phone] (H) (best number to reach her on) [phone] (M)

## 2023-08-22 NOTE — Patient Instructions (Signed)
 Goals Addressed             This Visit's Progress    Pharmacy Goals       Our goal A1c is less than 7%. This corresponds with fasting sugars less than 130 and 2 hour after meal sugars less than 180. Please keep a log of your results when checking your blood sugar   Our goal bad cholesterol, or LDL, is less than 70. This is why it is important to continue taking your atorvastatin.  If you need to reach out to patient assistance programs regarding refills or to find out the status of your application, you can do so by calling:  [employer] at [phone]   Thank you!  Estelle Grumbles, PharmD, [facility] [phone]

## 2023-08-31 ENCOUNTER — Other Ambulatory Visit: Payer: Self-pay | Admitting: Pharmacist

## 2023-09-14 ENCOUNTER — Other Ambulatory Visit: Payer: Self-pay | Admitting: Pharmacist

## 2023-09-14 DIAGNOSIS — E1129 Type 2 diabetes mellitus with other diabetic kidney complication: Secondary | ICD-10-CM

## 2023-09-14 NOTE — Progress Notes (Signed)
 09/14/2023 Name: Megan Short MRN: [id number] DOB: [birth year]  Chief Complaint  Patient presents with   Medication Management   Medication Assistance    Megan Short is a 78 y.o. year old female who presented for a telephone visit.   hey were self-referred to the pharmacist for assistance in managing medication access.      Subjective:   Care Team: Primary Care Provider: Alba Cory, MD ; Next Scheduled Visit: 10/13/2023   Medication Access/Adherence  Current Pharmacy:  [facility] [address] ([address]), [place] - [address] [place] ([place]) [place] [address] Phone: [phone] Fax: [phone]  [facility] Delivery - [place], [place] - [address] [place]  [address] Phone: [phone] Fax: [phone]  [facility] Mail Delivery - [place], [place] - [address] [place] [address] Phone: [phone] Fax: [phone]   Patient reports affordability concerns with their medications: No  Patient reports access/transportation concerns to their pharmacy: No  Patient reports adherence concerns with their medications:  No       Diabetes:   Current medications:  - metformin ER 750 mg daily - Rybelsus 7 mg daily Confirms takes on empty stomach, >=30 minutes before the first food, beverage, or other oral medications of the day with <=4 oz of plain water only Reports tolerating well now   Medications tried in the past: pioglitazone   Current glucose readings: morning fasting ranging: 90s   Denies symptoms of hypoglycemia such as shaking, sweating or dizziness   Current physical activity: Goes to gym (treadmill, aerobics and light weights) at least 90 minutes Monday-Friday  Reports current home weight ~129 lbs   Statin therapy: atorvastatin 80 mg daily   Current medication access support: Enrolled in patient assistance for Rybelsus from [employer]  through 06/06/2024  Reports received 4 month supply of Rybelsus from program last week   Objective:  Lab Results  Component Value Date   HGBA1C 5.8 (A) 05/27/2023    Lab Results  Component Value Date   CREATININE 1.06 (H) 01/26/2023   BUN 26 (H) 01/26/2023   NA 138 01/26/2023   K 4.6 01/26/2023   CL 105 01/26/2023   CO2 24 01/26/2023    Lab Results  Component Value Date   CHOL 137 01/26/2023   HDL 43 (L) 01/26/2023   LDLCALC 78 01/26/2023   TRIG 76 01/26/2023   CHOLHDL 3.2 01/26/2023    Medications Reviewed Today     Reviewed by Manuela Neptune, RPH-CPP (Pharmacist) on 09/14/23 at 0810  Med List Status: <None>   Medication Order Taking? Sig Documenting Provider Last Dose Status Informant  Accu-Chek Softclix Lancets lancets [id number]  Use as instructed Alba Cory, MD  Active   Alcohol Swabs (DROPSAFE ALCOHOL PREP) 70 % PADS [id number]  USE AS DIRECTED Alba Cory, MD  Active   atorvastatin (LIPITOR) 80 MG tablet [id number] Yes Take 1 tablet (80 mg total) by mouth every evening. Alba Cory, MD Taking Active   Blood Glucose Calibration (ACCU-CHEK GUIDE CONTROL) LIQD [id number]  1 each by In Vitro route once for 1 dose. Alba Cory, MD  Expired 09/20/22 2359   blood glucose meter kit and supplies [id number]  Dispense based on patient and insurance preference. Check bid daily as directed. (FOR ICD-10 E10.9, E11.9). Alba Cory, MD  Active   Blood Glucose Monitoring Suppl (ACCU-CHEK GUIDE) w/Device KIT [id number]  1 each by Does not apply  route once for 1 dose. Alba Cory, MD  Expired 09/20/22 2359   cholecalciferol (VITAMIN D3) 25 MCG (1000 UNIT) tablet [id number]  Take 1 tablet (1,000 Units total) by mouth daily. Alba Cory, MD  Active   citalopram (CELEXA) 40 MG tablet [id number]  Take 1 tablet (40 mg total) by mouth daily. Alba Cory, MD  Active   clonazePAM Scarlette Calico) 0.5 MG tablet [id number]  Take 1 tablet (0.5 mg total) by mouth 2 (two)  times daily as needed. Alba Cory, MD  Active   glucose blood (ACCU-CHEK GUIDE TEST) test strip [id number]  TEST BLOOD SUGAR TWICE DAILY AS NEEDED Alba Cory, MD  Active   hydroquinone 4 % cream [id number]  APPLY  CREAM TOPICALLY TO DARK SPOTS ON LEGS TWICE DAILY TO HELP Darrol Poke, MD  Active   levothyroxine (SYNTHROID) 75 MCG tablet [id number] Yes Take 1 tablet (75 mcg total) by mouth daily before breakfast. Alba Cory, MD Taking Active   loratadine (CLARITIN) 10 MG tablet [id number]  Take 1 tablet (10 mg total) by mouth daily. Alba Cory, MD  Active   losartan (COZAAR) 100 MG tablet [id number] Yes Take 1 tablet (100 mg total) by mouth daily. Alba Cory, MD Taking Active   metFORMIN (GLUCOPHAGE-XR) 750 MG 24 hr tablet [id number] Yes Take 1 tablet (750 mg total) by mouth daily with breakfast. Alba Cory, MD Taking Active   Semaglutide 7 MG TABS [id number] Yes Take 1 tablet by mouth daily. Take on empty stomach, 30 minutes before the first food, beverage, or other oral medications of the day with 4 oz of plain water only [provider] Taking Active   tacrolimus (PROTOPIC) 0.1 % ointment [id number]  Apply to affected areas once to twice daily as directed. Willeen Niece, MD  Active   tiZANidine (ZANAFLEX) 4 MG tablet [id number]  Take 0.5-1.5 tablets (2-6 mg total) by mouth every 8 (eight) hours as needed for muscle spasms (muscle tightness). Berniece Salines, FNP  Active               Assessment/Plan:   Diabetes: - Reviewed goal A1c, goal fasting, and goal 2 hour post prandial glucose - Encourage patient to have regular well-balanced meals while controlling carbohydrate portion sizes - Recommend to continue to check glucose, keep log of results and have this record to review during upcoming medical appointments - Remind patient to follow up with [employer] patient assistance program as needed for refills of Rybelsus                 Follow Up Plan:  Clinical Pharmacist will outreach to patient by telephone again on 03/21/2024 at 1:30 PM    Estelle Grumbles, PharmD, [facility] [phone]

## 2023-09-14 NOTE — Patient Instructions (Signed)
 Goals Addressed             This Visit's Progress    Pharmacy Goals       Our goal A1c is less than 7%. This corresponds with fasting sugars less than 130 and 2 hour after meal sugars less than 180. Please keep a log of your results when checking your blood sugar   Our goal bad cholesterol, or LDL, is less than 70. This is why it is important to continue taking your atorvastatin.  If you need to reach out to patient assistance programs regarding refills or to find out the status of your application, you can do so by calling:  [employer] at [phone]   Thank you!  Estelle Grumbles, PharmD, [facility] [phone]

## 2023-10-13 ENCOUNTER — Ambulatory Visit: Payer: Self-pay | Admitting: Family Medicine

## 2023-10-13 ENCOUNTER — Encounter: Payer: Self-pay | Admitting: Family Medicine

## 2023-10-13 VITALS — BP 112/70 | HR 93 | Resp 16 | Ht 59.5 in | Wt 128.0 lb

## 2023-10-13 DIAGNOSIS — F09 Unspecified mental disorder due to known physiological condition: Secondary | ICD-10-CM

## 2023-10-13 DIAGNOSIS — I679 Cerebrovascular disease, unspecified: Secondary | ICD-10-CM

## 2023-10-13 DIAGNOSIS — E1129 Type 2 diabetes mellitus with other diabetic kidney complication: Secondary | ICD-10-CM

## 2023-10-13 DIAGNOSIS — N1831 Chronic kidney disease, stage 3a: Secondary | ICD-10-CM

## 2023-10-13 DIAGNOSIS — I152 Hypertension secondary to endocrine disorders: Secondary | ICD-10-CM

## 2023-10-13 DIAGNOSIS — E559 Vitamin D deficiency, unspecified: Secondary | ICD-10-CM

## 2023-10-13 DIAGNOSIS — E785 Hyperlipidemia, unspecified: Secondary | ICD-10-CM

## 2023-10-13 DIAGNOSIS — F325 Major depressive disorder, single episode, in full remission: Secondary | ICD-10-CM | POA: Diagnosis not present

## 2023-10-13 DIAGNOSIS — R2243 Localized swelling, mass and lump, lower limb, bilateral: Secondary | ICD-10-CM

## 2023-10-13 DIAGNOSIS — E1159 Type 2 diabetes mellitus with other circulatory complications: Secondary | ICD-10-CM

## 2023-10-13 DIAGNOSIS — L509 Urticaria, unspecified: Secondary | ICD-10-CM

## 2023-10-13 DIAGNOSIS — E538 Deficiency of other specified B group vitamins: Secondary | ICD-10-CM

## 2023-10-13 DIAGNOSIS — E039 Hypothyroidism, unspecified: Secondary | ICD-10-CM

## 2023-10-13 LAB — POCT GLYCOSYLATED HEMOGLOBIN (HGB A1C): Hemoglobin A1C: 5.7 % — AB (ref 4.0–5.6)

## 2023-10-13 LAB — TSH: TSH: 0.22 m[IU]/L — ABNORMAL LOW (ref 0.40–4.50)

## 2023-10-13 MED ORDER — METFORMIN HCL ER 500 MG PO TB24: 500.0000 mg | ORAL_TABLET | Freq: Every day | ORAL | 1 refills | Status: DC

## 2023-10-13 MED ORDER — CLONAZEPAM 0.5 MG PO TABS
0.5000 mg | ORAL_TABLET | Freq: Every day | ORAL | 0 refills | Status: AC | PRN
Start: 2023-10-13 — End: ?

## 2023-10-13 MED ORDER — CITALOPRAM HYDROBROMIDE 40 MG PO TABS: 40.0000 mg | ORAL_TABLET | Freq: Every day | ORAL | 1 refills | Status: DC

## 2023-10-13 MED ORDER — LORATADINE 10 MG PO TABS: 10.0000 mg | ORAL_TABLET | Freq: Every day | ORAL | 1 refills | Status: DC

## 2023-10-13 MED ORDER — LOSARTAN POTASSIUM 100 MG PO TABS: 100.0000 mg | ORAL_TABLET | Freq: Every day | ORAL | 1 refills | Status: DC

## 2023-10-13 NOTE — Progress Notes (Signed)
 Name: Megan Short   MRN: [id number]    DOB: Jan 26, 1946   Date:10/13/2023       Progress Note  Subjective  Chief Complaint  Chief Complaint  Patient presents with   Medical Management of Chronic Issues   Discussed the use of AI scribe software for clinical note transcription with the patient, who gave verbal consent to proceed.  History of Present Illness Megan Short is a 78 year old female with type 2 diabetes, chronic kidney disease, and dyslipidemia who presents for a regular follow-up visit.  Her type 2 diabetes is well-controlled with an A1c of 5.8. She is on Rybelsus  7 mg and metformin  750 mg daily She has lost weight from 132 pounds in December to 128 pounds currently, noting a change in clothing size from size 8 to size 6. One year ago her weight was 147 lbs that is when we resumed Metformin  in addition to Rybelsus   She attributes the weight loss to her GLP-1 agonist medication. No frequent urination or excessive thirst.  She initially experienced nausea and loss of appetite with Rybelsus   but these symptoms have improved over the last two to three weeks, allowing her to eat better. She has noticed subcutaneous nodules on her lateral thight, more prominent on the left side, which are not painful. She wonders if these are related to her weight loss.  Her chronic kidney disease is stable with a GFR ranging between 45 and 57 since 2022, and her last reading at 55. She is on losartan  100 mg daily for blood pressure and kidney protection. No headaches, chest pain, or palpitations. Her proteinuria has normalized, and she avoids NSAIDs.  She has dyslipidemia and is on atorvastatin . Her cholesterol levels are improving, with an HDL of 43, she denies myalgias  She also has hypothyroidism and takes levothyroxine  75 mcg daily. Her TSH was not checked last year. No symptoms of hyperthyroidism such as palpitations or diarrhea, due to her weight loss we will recheck TSH today  She has a history of  major depression, currently in remission, and takes citalopram . She also has clonazepam  for anxiety or sleep issues, which she uses sparingly. Her memory has improved without specific interventions but it may be secondary to improvement of depression.  She takes Losartan  100 mg for HTN and  experiences occasional lightheadedness when standing, about once a month, but denies frequent dizziness. Her blood pressure at home is around 112/60  She has a history of vitamin B12 and D deficiencies, for which she takes B12 sublingually and vitamin D3 1000 IU daily. Her levels have been stable.    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/07/2014   Calculus of kidney 09/07/2014   Memory loss or impairment 09/07/2014    Past Surgical History:  Procedure Laterality Date   BREAST BIOPSY Right    negative times 2   COLONOSCOPY WITH PROPOFOL  N/A 01/20/2015   Procedure: COLONOSCOPY WITH PROPOFOL ;  Surgeon: Cassie Click, MD;  Location: [facility] ENDOSCOPY;  Service: Endoscopy;  Laterality: N/A;   COLONOSCOPY  WITH PROPOFOL  N/A 05/07/2020   Procedure: COLONOSCOPY WITH PROPOFOL ;  Surgeon: Toledo, Alphonsus Jeans, MD;  Location: [facility] ENDOSCOPY;  Service: Gastroenterology;  Laterality: N/A;   LITHOTRIPSY Left 12/2013    Family History  Problem Relation Age of Onset   Osteoarthritis Mother    Kidney failure Mother    Failure to thrive Mother    Osteoarthritis Sister    Alzheimer's disease Maternal Aunt    Breast  cancer Neg Hx     Social History   Tobacco Use   Smoking status: Never   Smokeless tobacco: Never   Tobacco comments:    Smoking cessation materials not required  Substance Use Topics   Alcohol  use: No    Alcohol /week: 0.0 standard drinks of alcohol      Current Outpatient Medications:    Accu-Chek Softclix Lancets lancets, Use as instructed, Disp: 100 each, Rfl: 12   Alcohol  Swabs (DROPSAFE ALCOHOL  PREP) 70 % PADS, USE AS DIRECTED, Disp: 200 each, Rfl: 3   atorvastatin  (LIPITOR) 80 MG tablet, Take 1 tablet (80 mg total) by mouth every evening., Disp: 90 tablet, Rfl: 1   blood glucose meter kit and supplies, Dispense based on patient and insurance preference. Check bid daily as directed. (FOR ICD-10 E10.9, E11.9)., Disp: 1 each, Rfl: 0   cholecalciferol (VITAMIN D3) 25 MCG (1000 UNIT) tablet, Take 1 tablet (1,000 Units total) by mouth daily., Disp: 90 tablet, Rfl: 1   citalopram  (CELEXA ) 40 MG tablet, Take 1 tablet (40 mg total) by mouth daily., Disp: 90 tablet, Rfl: 1   clonazePAM  (KLONOPIN ) 0.5 MG tablet, Take 1 tablet (0.5 mg total) by mouth 2 (two) times daily as needed., Disp: 30 tablet, Rfl: 0   glucose blood (ACCU-CHEK GUIDE TEST) test strip, TEST BLOOD SUGAR TWICE DAILY AS NEEDED, Disp: 200 strip, Rfl: 0   hydroquinone  4 % cream, APPLY  CREAM TOPICALLY TO DARK SPOTS ON LEGS TWICE DAILY TO HELP FADE, Disp: 29 g, Rfl: 0   levothyroxine  (SYNTHROID ) 75 MCG tablet, Take 1 tablet (75 mcg total) by mouth daily before breakfast., Disp: 100 tablet, Rfl: 1   loratadine  (CLARITIN ) 10 MG tablet, Take 1 tablet (10 mg total) by mouth daily., Disp: 90 tablet, Rfl: 1   losartan  (COZAAR ) 100 MG tablet, Take 1 tablet (100 mg total) by mouth daily., Disp: 90 tablet, Rfl: 1   metFORMIN  (GLUCOPHAGE -XR) 750 MG 24 hr tablet, Take 1 tablet (750 mg total) by mouth daily with breakfast., Disp: 90 tablet, Rfl: 1   Semaglutide  7 MG TABS, Take 1 tablet by mouth daily. Take on empty stomach, 30 minutes before  the first food, beverage, or other oral medications of the day with 4 oz of plain water only, Disp: , Rfl:    tacrolimus  (PROTOPIC ) 0.1 % ointment, Apply to affected areas once to twice daily as directed., Disp: 100 g, Rfl: 1   tiZANidine  (ZANAFLEX ) 4 MG tablet, Take 0.5-1.5 tablets (2-6 mg total) by mouth every 8 (eight) hours as needed for muscle spasms (muscle tightness)., Disp: 30 tablet, Rfl: 0   Blood Glucose Calibration (ACCU-CHEK GUIDE CONTROL) LIQD, 1 each by In Vitro route once for 1 dose., Disp: 1 each, Rfl: 3   Blood Glucose Monitoring Suppl (ACCU-CHEK GUIDE) w/Device KIT, 1 each by Does not apply route once for 1 dose., Disp: 1 kit, Rfl: 0  Allergies  Allergen Reactions   Sulfa Antibiotics     I personally reviewed active problem list, medication list, allergies, family history with the patient/caregiver today.  ROS  Ten systems reviewed and is negative except as mentioned in HPI    Objective Physical Exam  CONSTITUTIONAL: Patient appears well-developed and well-nourished. No distress. HEENT: Head atraumatic, normocephalic, neck supple. CARDIOVASCULAR: Normal rate, regular rhythm and normal heart sounds. No murmur heard. No BLE edema. PULMONARY: Effort normal and breath sounds normal. No respiratory distress. ABDOMINAL: There is no tenderness or distention. Skin: non tender subcutaneous nodules on lateral thigh, no skin changes such as redness, non tender  MUSCULOSKELETAL: Normal gait. Without gross motor or sensory deficit. PSYCHIATRIC: Patient has a normal mood and affect. Behavior is normal. Judgment and thought content normal.  Vitals:   10/13/23 1033  BP: 112/70  Pulse: 93  Resp: 16  SpO2: 100%  Weight: 128 lb (58.1 kg)  Height: 4' 11.5" (1.511 m)    Body mass index is 25.42 kg/m.  Recent Results (from the past 2160 hours)  HM DIABETES EYE EXAM     Status: None   Collection Time: 08/09/23 12:52 PM  Result Value Ref Range   HM Diabetic Eye Exam No  Retinopathy No Retinopathy    Comment: Abstracted by HIM    Diabetic Foot Exam:     PHQ2/9:    10/13/2023   10:28 AM 05/27/2023   11:19 AM 05/26/2023   10:51 AM 04/07/2023   11:29 AM 01/26/2023    9:38 AM  Depression screen PHQ 2/9  Decreased Interest 0 0 0 0 0  Down, Depressed, Hopeless 0 0 0 0 0  PHQ - 2 Score 0 0 0 0 0  Altered sleeping 0 0 0 0 0  Tired, decreased energy 0 0 0 0 0  Change in appetite 0 0 0 0 0  Feeling bad or failure about yourself  0 0 0 0 0  Trouble concentrating 0 0 0 0 0  Moving slowly or fidgety/restless 0 0 0 0 0  Suicidal thoughts 0 0 0 0 0  PHQ-9 Score 0 0 0 0 0  Difficult doing work/chores Not difficult at all Not difficult at all Not difficult at all Not difficult at all     phq 9 is negative  Fall Risk:    10/13/2023   10:28 AM 05/26/2023   10:54 AM 04/07/2023   11:27 AM 01/26/2023    9:38 AM 09/29/2022    2:31 PM  Fall Risk   Falls in the past year? 0 0 0 0 0  Number falls in past yr: 0 0 0  0  Injury with Fall? 0 0 0  0  Risk for fall due to : No Fall Risks No Fall Risks  No Fall Risks No Fall Risks  Follow up Falls prevention discussed;Education provided;Falls evaluation completed Falls prevention discussed;Falls evaluation completed  Falls prevention discussed Falls prevention discussed;Education provided;Falls evaluation completed     Assessment & Plan Hypertension Well-controlled with losartan . Occasional lightheadedness upon standing. - Continue losartan  100 mg daily. - Advise hydration, especially in summer. - Instruct to rise slowly from sitting or lying positions.  Dyslipidemia Managed with atorvastatin . LDL slightly increased but acceptable. HDL improving. - Continue atorvastatin  as prescribed. - Encourage tree nuts, fish, and physical activity to improve HDL.  Type 2 diabetes mellitus with CKD stage 3A Diabetes well-controlled with Rybelsus  and metformin . A1c at 5.8. CKD stable. Proteinuria normalized. Weight loss  likely due to medication. - Continue Rybelsus  7 mg daily. - Reduce metformin  to 500 mg daily. - Monitor kidney function and proteinuria. - Advise against NSAIDs. - Encourage  hydration and medication adherence.  Adult hypothyroidism Managed with levothyroxine . No hyperthyroid symptoms. TSH to be checked due to weight loss. - Continue levothyroxine  75 mcg daily. - Check TSH levels today. - Recheck TSH in August.  Major depression in remission In remission with citalopram . Occasional use of clonazepam  for anxiety or sleep. - Continue citalopram  as prescribed. - Prescribe clonazepam  as needed for anxiety or sleep.  Subcutaneous nodules Nodules on legs, likely related to weight loss. Not painful or growing. - Monitor nodules for changes. - Consider ultrasound if nodules become painful or increase in size.

## 2023-10-14 ENCOUNTER — Other Ambulatory Visit: Payer: Self-pay | Admitting: Family Medicine

## 2023-10-14 DIAGNOSIS — E039 Hypothyroidism, unspecified: Secondary | ICD-10-CM

## 2023-10-14 MED ORDER — LEVOTHYROXINE SODIUM 75 MCG PO TABS: 75.0000 ug | ORAL_TABLET | Freq: Every day | ORAL | 0 refills | Status: DC

## 2023-10-17 ENCOUNTER — Other Ambulatory Visit: Payer: Self-pay | Admitting: Family Medicine

## 2023-10-17 ENCOUNTER — Other Ambulatory Visit: Payer: Self-pay

## 2023-10-17 DIAGNOSIS — E039 Hypothyroidism, unspecified: Secondary | ICD-10-CM

## 2023-10-17 MED ORDER — LEVOTHYROXINE SODIUM 75 MCG PO TABS: 75.0000 ug | ORAL_TABLET | Freq: Every day | ORAL | 0 refills | Status: DC

## 2023-10-17 NOTE — Telephone Encounter (Signed)
 Please clarify instructions

## 2023-10-24 ENCOUNTER — Other Ambulatory Visit: Payer: Self-pay | Admitting: Family Medicine

## 2023-10-24 DIAGNOSIS — L509 Urticaria, unspecified: Secondary | ICD-10-CM

## 2023-11-28 ENCOUNTER — Other Ambulatory Visit: Payer: Self-pay

## 2023-11-28 DIAGNOSIS — E039 Hypothyroidism, unspecified: Secondary | ICD-10-CM

## 2023-11-28 LAB — TSH: TSH: 0.73 m[IU]/L (ref 0.40–4.50)

## 2023-11-29 ENCOUNTER — Ambulatory Visit: Payer: Self-pay | Admitting: Family Medicine

## 2023-12-15 NOTE — Telephone Encounter (Signed)
 Patient called and states that her husband Quentin Strebel will be picking up her medication.

## 2023-12-20 ENCOUNTER — Other Ambulatory Visit: Payer: Self-pay | Admitting: Family Medicine

## 2023-12-20 DIAGNOSIS — E785 Hyperlipidemia, unspecified: Secondary | ICD-10-CM

## 2023-12-21 ENCOUNTER — Ambulatory Visit
Admission: RE | Admit: 2023-12-21 | Discharge: 2023-12-21 | Disposition: A | Discharge status: Home / Self Care | Source: Ambulatory Visit | Attending: Family Medicine | Admitting: Family Medicine

## 2023-12-21 ENCOUNTER — Ambulatory Visit: Payer: Self-pay | Admitting: Family Medicine

## 2023-12-21 DIAGNOSIS — Z780 Asymptomatic menopausal state: Secondary | ICD-10-CM | POA: Insufficient documentation

## 2023-12-21 DIAGNOSIS — Z1231 Encounter for screening mammogram for malignant neoplasm of breast: Secondary | ICD-10-CM | POA: Insufficient documentation

## 2024-01-24 ENCOUNTER — Other Ambulatory Visit: Payer: Self-pay | Admitting: Family Medicine

## 2024-01-24 DIAGNOSIS — L509 Urticaria, unspecified: Secondary | ICD-10-CM

## 2024-01-24 NOTE — Telephone Encounter (Unsigned)
 Copied from CRM [id number]. Topic: Clinical - Medication Refill >> Jan 24, 2024 12:04 PM Jasmin G wrote: Medication: loratadine  (CLARITIN ) 10 MG tablet  Has the patient contacted their pharmacy? Yes (Agent: If no, request that the patient contact the pharmacy for the refill. If patient does not wish to contact the pharmacy document the reason why and proceed with request.) (Agent: If yes, when and what did the pharmacy advise?)  This is the patient's preferred pharmacy:  [facility] [address]), [place] - [address] [place]) [place] [address] Phone: [phone] Fax: [phone]  Is this the correct pharmacy for this prescription? Yes If no, delete pharmacy and type the correct one.   Has the prescription been filled recently? No  Is the patient out of the medication? Yes  Has the patient been seen for an appointment in the last year OR does the patient have an upcoming appointment? Yes  Can we respond through MyChart? Yes  Agent: Please be advised that Rx refills may take up to 3 business days. We ask that you follow-up with your pharmacy.

## 2024-01-26 NOTE — Telephone Encounter (Signed)
 Too soon for refill, LRF 10/13/23 for 90 and 1 RF.  Requested Prescriptions  Pending Prescriptions Disp Refills   loratadine  (CLARITIN ) 10 MG tablet 90 tablet 1    Sig: Take 1 tablet (10 mg total) by mouth daily.     Ear, Nose, and Throat:  Antihistamines 2 Failed - 01/26/2024  8:07 AM      Failed - Cr in normal range and within 360 days    Creat  Date Value Ref Range Status  01/26/2023 1.06 (H) 0.60 - 1.00 mg/dL Final   Creatinine, Urine  Date Value Ref Range Status  01/26/2023 47 20 - 275 mg/dL Final         Passed - Valid encounter within last 12 months    Recent Outpatient Visits           3 months ago Hypertension associated with type 2 diabetes mellitus [facility])   [facility] Sowles, Krichna, MD

## 2024-02-13 ENCOUNTER — Encounter: Payer: Self-pay | Admitting: Family Medicine

## 2024-02-13 ENCOUNTER — Ambulatory Visit: Admitting: Family Medicine

## 2024-02-13 VITALS — BP 110/74 | HR 86 | Resp 16 | Ht 59.5 in | Wt 130.4 lb

## 2024-02-13 DIAGNOSIS — E1159 Type 2 diabetes mellitus with other circulatory complications: Secondary | ICD-10-CM | POA: Diagnosis not present

## 2024-02-13 DIAGNOSIS — I152 Hypertension secondary to endocrine disorders: Secondary | ICD-10-CM

## 2024-02-13 DIAGNOSIS — E785 Hyperlipidemia, unspecified: Secondary | ICD-10-CM | POA: Diagnosis not present

## 2024-02-13 DIAGNOSIS — N1831 Chronic kidney disease, stage 3a: Secondary | ICD-10-CM | POA: Diagnosis not present

## 2024-02-13 DIAGNOSIS — L509 Urticaria, unspecified: Secondary | ICD-10-CM

## 2024-02-13 DIAGNOSIS — B351 Tinea unguium: Secondary | ICD-10-CM

## 2024-02-13 DIAGNOSIS — F325 Major depressive disorder, single episode, in full remission: Secondary | ICD-10-CM | POA: Diagnosis not present

## 2024-02-13 DIAGNOSIS — E559 Vitamin D deficiency, unspecified: Secondary | ICD-10-CM

## 2024-02-13 DIAGNOSIS — E1169 Type 2 diabetes mellitus with other specified complication: Secondary | ICD-10-CM

## 2024-02-13 DIAGNOSIS — I739 Peripheral vascular disease, unspecified: Secondary | ICD-10-CM

## 2024-02-13 DIAGNOSIS — E039 Hypothyroidism, unspecified: Secondary | ICD-10-CM

## 2024-02-13 DIAGNOSIS — F09 Unspecified mental disorder due to known physiological condition: Secondary | ICD-10-CM

## 2024-02-13 DIAGNOSIS — E538 Deficiency of other specified B group vitamins: Secondary | ICD-10-CM

## 2024-02-13 LAB — POCT GLYCOSYLATED HEMOGLOBIN (HGB A1C): Hemoglobin A1C: 5.9 % — AB (ref 4.0–5.6)

## 2024-02-13 MED ORDER — LORATADINE 10 MG PO TABS
10.0000 mg | ORAL_TABLET | Freq: Every day | ORAL | 1 refills | Status: AC
Start: 2024-02-13 — End: ?

## 2024-02-13 MED ORDER — TERBINAFINE HCL 250 MG PO TABS: 250.0000 mg | ORAL_TABLET | Freq: Every day | ORAL | 0 refills | Status: AC

## 2024-02-13 MED ORDER — ATORVASTATIN CALCIUM 80 MG PO TABS: 80.0000 mg | ORAL_TABLET | Freq: Every evening | ORAL | 1 refills | Status: AC

## 2024-02-13 NOTE — Progress Notes (Signed)
 Name: Megan Short   MRN: [id number]    DOB: 07-09-1945   Date:02/13/2024       Progress Note  Subjective  Chief Complaint  Chief Complaint  Patient presents with   Medical Management of Chronic Issues   Discussed the use of AI scribe software for clinical note transcription with the patient, who gave verbal consent to proceed.  History of Present Illness Megan Short is a 78 year old female with type 2 diabetes, dyslipidemia, hypertension, and chronic kidney disease who presents for a follow-up visit.  Her type 2 diabetes is managed with metformin  500 mg daily and Rybelsus  7 mg, with her current A1c at 5.9%, slightly increased from 5.7% previously. She experiences nocturia, having to get up five times on one occasion, attributed to drinking tea before bed, but this has resolved to her usual pattern of once per night.  She has noticed toenail changes consistent with onychomycosis for over a year, beginning with one toenail and spreading to others, described as thickening and peeling off. She has not received treatment for this condition.  Her dyslipidemia  associated with DM 2 and is managed with atorvastatin  80 mg, with no reported side effects. For hypertension, she takes losartan  100 mg daily, with home blood pressure readings around 130/80 mmHg and an office reading of 110 mmHg. She does not experience dizziness or lightheadedness.  She is on citalopram  40 mg for major recurrent depression, which she finds effective. She uses clonazepam  sparingly, having received 30 tablets in May and still having many left.   For her thyroid  condition, she takes levothyroxine  75 mcg daily with no issues such as difficulty swallowing, changes in bowel movements, or dry skin.  She has a history of hives but is not currently experiencing them and does not need loratadine  at this time.   She has chronic kidney disease stage 3A associated with type 2 diabetes and is taking ARB as directed. BP today slightly  low but at home is around 130, advised to take medication at night.   Small vessel disease, and has mild cognitive dysfunction, taking statin therapy/  She takes vitamin D  and B12 supplements as well.    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/07/2014   Calculus of kidney 09/07/2014   Memory loss or impairment 09/07/2014    Past Surgical History:  Procedure Laterality Date   BREAST BIOPSY Right    negative times 2   COLONOSCOPY WITH PROPOFOL  N/A 01/20/2015   Procedure: COLONOSCOPY WITH PROPOFOL ;  Surgeon: Lamar ONEIDA Holmes, MD;  Location: [facility] ENDOSCOPY;  Service: Endoscopy;  Laterality: N/A;   COLONOSCOPY WITH PROPOFOL  N/A 05/07/2020   Procedure: COLONOSCOPY WITH PROPOFOL ;  Surgeon: Toledo, Ladell POUR, MD;  Location: [facility] ENDOSCOPY;  Service: Gastroenterology;  Laterality: N/A;   LITHOTRIPSY Left 12/2013    Family History  Problem Relation Age of Onset   Osteoarthritis Mother    Kidney failure Mother    Failure to thrive Mother    Osteoarthritis Sister    Alzheimer's disease Maternal Aunt  Breast cancer Neg Hx     Social History   Tobacco Use   Smoking status: Never   Smokeless tobacco: Never   Tobacco comments:    Smoking cessation materials not required  Substance Use Topics   Alcohol  use: No    Alcohol /week: 0.0 standard drinks of alcohol      Current Outpatient Medications:    Accu-Chek Softclix Lancets lancets, Use as instructed, Disp: 100 each, Rfl: 12    Alcohol  Swabs (DROPSAFE ALCOHOL  PREP) 70 % PADS, USE AS DIRECTED, Disp: 200 each, Rfl: 3   atorvastatin  (LIPITOR) 80 MG tablet, Take 1 tablet by mouth in the evening, Disp: 90 tablet, Rfl: 0   Blood Glucose Calibration (ACCU-CHEK GUIDE CONTROL) LIQD, 1 each by In Vitro route once for 1 dose., Disp: 1 each, Rfl: 3   blood glucose meter kit and supplies, Dispense based on patient and insurance preference. Check bid daily as directed. (FOR ICD-10 E10.9, E11.9)., Disp: 1 each, Rfl: 0   cholecalciferol (VITAMIN D3) 25 MCG (1000 UNIT) tablet, Take 1 tablet (1,000 Units total) by mouth daily., Disp: 90 tablet, Rfl: 1   citalopram  (CELEXA ) 40 MG tablet, Take 1 tablet (40 mg total) by mouth daily., Disp: 90 tablet, Rfl: 1   clonazePAM  (KLONOPIN ) 0.5 MG tablet, Take 1 tablet (0.5 mg total) by mouth daily as needed., Disp: 30 tablet, Rfl: 0   glucose blood (ACCU-CHEK GUIDE TEST) test strip, TEST BLOOD SUGAR TWICE DAILY AS NEEDED, Disp: 200 strip, Rfl: 0   hydroquinone  4 % cream, APPLY  CREAM TOPICALLY TO DARK SPOTS ON LEGS TWICE DAILY TO HELP FADE, Disp: 29 g, Rfl: 0   levothyroxine  (SYNTHROID ) 75 MCG tablet, TAKE 1 TABLET BY MOUTH ONCE DAILY BEFORE BREAKFAST, Disp: 90 tablet, Rfl: 0   loratadine  (CLARITIN ) 10 MG tablet, Take 1 tablet (10 mg total) by mouth daily., Disp: 90 tablet, Rfl: 1   losartan  (COZAAR ) 100 MG tablet, Take 1 tablet (100 mg total) by mouth daily., Disp: 90 tablet, Rfl: 1   metFORMIN  (GLUCOPHAGE -XR) 500 MG 24 hr tablet, Take 1 tablet (500 mg total) by mouth daily with breakfast., Disp: 90 tablet, Rfl: 1   Semaglutide  7 MG TABS, Take 1 tablet by mouth daily. Take on empty stomach, 30 minutes before the first food, beverage, or other oral medications of the day with 4 oz of plain water only, Disp: , Rfl:    tacrolimus  (PROTOPIC ) 0.1 % ointment, Apply to affected areas once to twice daily as directed., Disp: 100 g, Rfl: 1  Allergies  Allergen Reactions   Sulfa Antibiotics     I personally  reviewed active problem list, medication list, allergies, family history with the patient/caregiver today.   ROS  Ten systems reviewed and is negative except as mentioned in HPI    Objective Physical Exam VITALS: BP- 110/ MEASUREMENTS: Weight- 130. CONSTITUTIONAL: Patient appears well-developed and well-nourished. No distress. HEENT: Head atraumatic, normocephalic, neck supple. CARDIOVASCULAR: Normal rate, regular rhythm and normal heart sounds. No murmur heard. No BLE edema. PULMONARY: Effort normal and breath sounds normal. No respiratory distress. ABDOMINAL: There is no tenderness or distention. MUSCULOSKELETAL: Normal gait. Without gross motor or sensory deficit. PSYCHIATRIC: Patient has a normal mood and affect. Behavior is normal. Judgment and thought content normal. NEUROLOGICAL: Sensation intact to light touch.  Vitals:   02/13/24 1034  BP: 110/74  Pulse: 86  Resp: 16  SpO2: 99%  Weight: 130 lb 6.4 oz (59.1 kg)  Height: 4' 11.5 (1.511 m)  Body mass index is 25.9 kg/m.  Recent Results (from the past 2160 hours)  TSH     Status: None   Collection Time: 11/28/23 10:42 AM  Result Value Ref Range   TSH 0.73 0.40 - 4.50 mIU/L  POCT glycosylated hemoglobin (Hb A1C)     Status: Abnormal   Collection Time: 02/13/24 10:37 AM  Result Value Ref Range   Hemoglobin A1C 5.9 (A) 4.0 - 5.6 %   HbA1c POC (<> result, manual entry)     HbA1c, POC (prediabetic range)     HbA1c, POC (controlled diabetic range)      Diabetic Foot Exam:  Diabetic foot exam was performed with the following findings:   Normal sensation of 10g monofilament Intact posterior tibialis and dorsalis pedis pulses Onychomycosis multiple toe nails       PHQ2/9:    02/13/2024   10:20 AM 10/13/2023   10:28 AM 05/27/2023   11:19 AM 05/26/2023   10:51 AM 04/07/2023   11:29 AM  Depression screen PHQ 2/9  Decreased Interest 0 0 0 0 0  Down, Depressed, Hopeless 0 0 0 0 0  PHQ - 2 Score 0 0 0 0 0   Altered sleeping 0 0 0 0 0  Tired, decreased energy 0 0 0 0 0  Change in appetite 0 0 0 0 0  Feeling bad or failure about yourself  0 0 0 0 0  Trouble concentrating 0 0 0 0 0  Moving slowly or fidgety/restless 0 0 0 0 0  Suicidal thoughts 0 0 0 0 0  PHQ-9 Score 0 0 0 0 0  Difficult doing work/chores Not difficult at all Not difficult at all Not difficult at all Not difficult at all Not difficult at all    phq 9 is negative  Fall Risk:    02/13/2024   10:19 AM 10/13/2023   10:28 AM 05/26/2023   10:54 AM 04/07/2023   11:27 AM 01/26/2023    9:38 AM  Fall Risk   Falls in the past year? 0 0 0 0 0  Number falls in past yr: 0 0 0 0   Injury with Fall? 0 0 0 0   Risk for fall due to : No Fall Risks No Fall Risks No Fall Risks  No Fall Risks  Follow up Falls evaluation completed Falls prevention discussed;Education provided;Falls evaluation completed Falls prevention discussed;Falls evaluation completed  Falls prevention discussed      Assessment & Plan Type 2 diabetes mellitus with hyperlipidemia, hypertension, and chronic kidney disease stage 3a Diabetes well-controlled with A1c of 5.9%. Hypertension managed with losartan . Hyperlipidemia controlled with atorvastatin . Chronic kidney disease stage 3a. - Continue Rybelsus  7 mg and metformin  500 mg daily. - Continue atorvastatin  80 mg daily. - Continue losartan  100 mg daily. - Check comprehensive blood work including kidney function, parathyroid  function, anemia, and proteinuria. - Advise taking losartan  in the evening.  Onychomycosis (toenail fungus) associated with type 2 DM Onychomycosis with thickening and peeling of toenails. Prefers daily dosing of terbinafine . - Prescribe terbinafine  250 mg daily for three months. - Monitor liver function tests before starting terbinafine .  Hypothyroidism Managed with levothyroxine  75 mcg daily. - Continue levothyroxine  75 mcg daily. - Monitor thyroid  function tests.  Major depressive  disorder, recurrent Managed with citalopram  40 mg daily. Reports feeling fine emotionally. - Continue citalopram  40 mg daily. - Use clonazepam  as needed.  Small vessel disease ( brain )  - Continue monitoring cognitive function. - Continue statin therapy  General Health Maintenance Due for flu vaccine, prefers October. - Schedule flu vaccine for October.

## 2024-02-14 ENCOUNTER — Ambulatory Visit: Payer: Self-pay | Admitting: Family Medicine

## 2024-02-14 DIAGNOSIS — Z79899 Other long term (current) drug therapy: Secondary | ICD-10-CM

## 2024-02-14 LAB — EXTRA

## 2024-02-14 LAB — COMPREHENSIVE METABOLIC PANEL WITH GFR
AG Ratio: 1.2 (calc) (ref 1.0–2.5)
ALT: 31 U/L — ABNORMAL HIGH (ref 6–29)
AST: 51 U/L — ABNORMAL HIGH (ref 10–35)
Albumin: 4.1 g/dL (ref 3.6–5.1)
Alkaline phosphatase (APISO): 116 U/L (ref 37–153)
BUN: 21 mg/dL (ref 7–25)
CO2: 27 mmol/L (ref 20–32)
Calcium: 10.4 mg/dL (ref 8.6–10.4)
Chloride: 105 mmol/L (ref 98–110)
Creat: 0.96 mg/dL (ref 0.60–1.00)
Globulin: 3.3 g/dL (ref 1.9–3.7)
Glucose, Bld: 85 mg/dL (ref 65–99)
Potassium: 5.1 mmol/L (ref 3.5–5.3)
Sodium: 140 mmol/L (ref 135–146)
Total Bilirubin: 0.6 mg/dL (ref 0.2–1.2)
Total Protein: 7.4 g/dL (ref 6.1–8.1)
eGFR: 61 mL/min/1.73m2 (ref >=60)

## 2024-02-14 LAB — CBC WITH DIFFERENTIAL/PLATELET
Absolute Lymphocytes: 2424 {cells}/uL (ref 850–3900)
Absolute Monocytes: 528 {cells}/uL (ref 200–950)
Basophils Absolute: 29 {cells}/uL (ref 0–200)
Basophils Relative: 0.5 %
Eosinophils Absolute: 99 {cells}/uL (ref 15–500)
Eosinophils Relative: 1.7 %
HCT: 43.6 % (ref 35.0–45.0)
Hemoglobin: 14 g/dL (ref 11.7–15.5)
MCH: 30.5 pg (ref 27.0–33.0)
MCHC: 32.1 g/dL (ref 32.0–36.0)
MCV: 95 fL (ref 80.0–100.0)
MPV: 10.5 fL (ref 7.5–12.5)
Monocytes Relative: 9.1 %
Neutro Abs: 2720 {cells}/uL (ref 1500–7800)
Neutrophils Relative %: 46.9 %
Platelets: 239 Thousand/uL (ref 140–400)
RBC: 4.59 Million/uL (ref 3.80–5.10)
RDW: 12.8 % (ref 11.0–15.0)
Total Lymphocyte: 41.8 %
WBC: 5.8 Thousand/uL (ref 3.8–10.8)

## 2024-02-14 LAB — MICROALBUMIN / CREATININE URINE RATIO
Creatinine, Urine: 161 mg/dL (ref 20–275)
Microalb Creat Ratio: 10 mg/g{creat} (ref <30)
Microalb, Ur: 1.6 mg/dL

## 2024-02-14 LAB — B12 AND FOLATE PANEL
Folate: 13.2 ng/mL
Vitamin B-12: >2000 pg/mL — ABNORMAL HIGH (ref 200–1100)

## 2024-02-14 LAB — PARATHYROID HORMONE, INTACT (NO CA): PTH: 42 pg/mL (ref 16–77)

## 2024-02-14 LAB — LIPID PANEL
Cholesterol: 122 mg/dL (ref <200)
HDL: 38 mg/dL — ABNORMAL LOW (ref >=50)
LDL Cholesterol (Calc): 69 mg/dL
Non-HDL Cholesterol (Calc): 84 mg/dL (ref <130)
Total CHOL/HDL Ratio: 3.2 (calc) (ref <5.0)
Triglycerides: 73 mg/dL (ref <150)

## 2024-02-14 LAB — VITAMIN D 25 HYDROXY (VIT D DEFICIENCY, FRACTURES): Vit D, 25-Hydroxy: 60 ng/mL (ref 30–100)

## 2024-03-06 ENCOUNTER — Other Ambulatory Visit: Payer: Self-pay | Admitting: Family Medicine

## 2024-03-06 DIAGNOSIS — E039 Hypothyroidism, unspecified: Secondary | ICD-10-CM

## 2024-03-07 ENCOUNTER — Other Ambulatory Visit: Payer: Self-pay | Admitting: Family Medicine

## 2024-03-14 ENCOUNTER — Ambulatory Visit

## 2024-03-21 ENCOUNTER — Other Ambulatory Visit: Payer: Self-pay | Admitting: Pharmacist

## 2024-03-21 ENCOUNTER — Encounter: Payer: Self-pay | Admitting: Pharmacist

## 2024-03-21 DIAGNOSIS — E1129 Type 2 diabetes mellitus with other diabetic kidney complication: Secondary | ICD-10-CM

## 2024-03-21 NOTE — Patient Instructions (Signed)
 [employer], the maker of Ozempic and Rybelsus , has announced that they will no longer offer these medications to Medicare beneficiaries through their Patient Assistance Program in 2026.    This means that if you currently receive Rybelsus  at no cost through the program, starting in January 2026 you'll need to use your insurance to continue on these medicines.      Choosing a Medicare Plan   With Medicare's Annual Enrollment Period starting on October 15th, we encourage you to review formulary coverage and copay amounts for Ozempic or Rybelsus , as costs can vary widely between plans. Starting on Wednesday, October 1st you can compare your Medicare plan options by going to the Plan Finder tool at [employer].gov ( TeleconferenceOnDemand.fr ).    There are Medicare Specialists through the [facility] Insurance Information Program ([place]) that can help you shop for Medicare plans.    [place] [facility] toll free number: [phone] (Monday - Friday 8 am - 5 pm)   There are representatives located in each county:    [place] [facility] [address]     [place]  [address] [phone] Call for an appointment with [facility]   Sharyle Sia, PharmD, [facility] [phone]

## 2024-03-21 NOTE — Progress Notes (Signed)
 03/21/2024 Name: Danity Schmelzer MRN: [id number] DOB: 1945/09/17  Chief Complaint  Patient presents with   Medication Assistance   Medication Management    Bellarose Burtt is a 78 y.o. year old female who presented for a telephone visit.   They were self-referred to the pharmacist for assistance in managing medication access.      Subjective:   Care Team: Primary Care Provider: Sowles, Krichna, MD ; Next Scheduled Visit: 06/15/2024    Medication Access/Adherence  Current Pharmacy:  [facility] [address] ([address]), [place] - [address] ([address]) [place] [address] Phone: [phone] Fax: [phone]  [facility] - [place], [place] - [address] [place] [address] Phone: [phone] Fax: [phone]  [facility] - [place], [place] - [address] [place] [address] Phone: [phone] Fax: [phone]   Patient reports affordability concerns with their medications: No  Patient reports access/transportation concerns to their pharmacy: No  Patient reports adherence concerns with their medications:  No       Diabetes:   Current medications:  - metformin  ER 750 mg daily - Rybelsus  7 mg daily Confirms takes on empty stomach, >=30 minutes before the first food, beverage, or other oral medications of the day with <=4 oz of plain water only Reports tolerating well now   Medications tried in the past: pioglitazone    Current glucose readings: morning fasting ranging: 88-100s   Denies symptoms of hypoglycemia such as shaking, sweating or dizziness   Current physical activity: Goes to gym (treadmill, aerobics and light weights) at least 60-90 minutes Monday-Friday    Statin therapy: atorvastatin  80 mg daily   Current medication access support: Enrolled in patient assistance for Rybelsus  from [employer] through 06/06/2024              Reports received 4 month supply of Rybelsus  from program recently - [employer] has announced that they will no longer offer Rybelsus  to Medicare beneficiaries through their Patient Assistance Program in 2026.      Objective:  Lab Results  Component Value Date   HGBA1C 5.9 (A) 02/13/2024    Lab Results  Component Value Date   CREATININE 0.96 02/13/2024   BUN 21 02/13/2024   NA 140 02/13/2024   K 5.1 02/13/2024   CL 105 02/13/2024   CO2 27 02/13/2024    Lab Results  Component Value Date   CHOL 122 02/13/2024   HDL 38 (L) 02/13/2024   LDLCALC 69 02/13/2024   TRIG 73 02/13/2024   CHOLHDL 3.2 02/13/2024    Medications Reviewed Today     Reviewed by Alana Sharyle LABOR, RPH-CPP (Pharmacist) on 03/21/24 at 1354  Med List Status: <None>   Medication Order Taking? Sig Documenting Provider Last Dose Status Informant  Accu-Chek Softclix Lancets lancets [id number]  Use as instructed Sowles, Krichna, MD  Active   Alcohol  Swabs (DROPSAFE ALCOHOL  PREP) 70 % PADS [id number]  USE AS DIRECTED Sowles, Krichna, MD  Active   atorvastatin  (LIPITOR) 80 MG tablet [id number]  Take 1 tablet (80 mg total) by mouth every evening. Sowles, Krichna, MD  Active   Blood Glucose Calibration (ACCU-CHEK GUIDE CONTROL) LIQD [id number]  1 each by In Vitro route once for 1 dose. Sowles, Krichna, MD  Expired 02/13/24 2359   blood glucose meter kit and supplies [id number]  Dispense based on patient and insurance preference. Check bid daily as  directed. (FOR ICD-10 E10.9, E11.9). Sowles, Krichna, MD  Active   cholecalciferol (VITAMIN D3) 25 MCG (1000 UNIT) tablet [id number]  Take 1 tablet (1,000 Units total) by mouth daily. Sowles, Krichna, MD  Active   citalopram  (CELEXA ) 40 MG tablet [id number]  Take 1 tablet (40 mg total) by mouth daily. Sowles, Krichna, MD  Active   clonazePAM  (KLONOPIN ) 0.5 MG tablet [id number]  Take 1 tablet (0.5 mg total) by mouth daily as needed. Sowles, Krichna, MD  Active   glucose blood  (ACCU-CHEK GUIDE TEST) test strip [id number]  TEST BLOOD SUGAR TWICE DAILY AS NEEDED Sowles, Krichna, MD  Active   hydroquinone  4 % cream [id number]  APPLY  CREAM TOPICALLY TO DARK SPOTS ON LEGS TWICE DAILY TO HELP ETHYL Jackquline Sawyer, MD  Active   levothyroxine  (SYNTHROID ) 75 MCG tablet [id number]  TAKE 1 TABLET BY MOUTH ONCE DAILY BEFORE OFILIA Loron, Krichna, MD  Active   loratadine  (CLARITIN ) 10 MG tablet [id number]  Take 1 tablet (10 mg total) by mouth daily. Sowles, Krichna, MD  Active   losartan  (COZAAR ) 100 MG tablet [id number]  Take 1 tablet (100 mg total) by mouth daily. Sowles, Krichna, MD  Active   metFORMIN  (GLUCOPHAGE -XR) 500 MG 24 hr tablet [id number] Yes Take 1 tablet (500 mg total) by mouth daily with breakfast. Sowles, Krichna, MD  Active   Semaglutide  7 MG TABS [id number] Yes Take 1 tablet by mouth daily. Take on empty stomach, 30 minutes before the first food, beverage, or other oral medications of the day with 4 oz of plain water only [provider]  Active   tacrolimus  (PROTOPIC ) 0.1 % ointment [id number]  Apply to affected areas once to twice daily as directed. Jackquline Sawyer, MD  Active   terbinafine  (LAMISIL ) 250 MG tablet [id number]  Take 1 tablet (250 mg total) by mouth daily. Sowles, Krichna, MD  Active               Assessment/Plan:   Based on reported income, patient does not meet criteria for Extra Help subsidy  Counsel patient that Medicare's Annual Enrollment Period for 2026 starts 03/21/2024. Encourage patient to review deductibles formulary coverage and copay amounts (including for Rybelsus ) between plans. Also share with patient the contact information for the [facility] Information Program [facility]) that can help with reviewing these Medicare plans - As requested will share this information with patient via MyChart message    Diabetes: - Reviewed goal A1c, goal fasting, and goal 2 hour post prandial glucose - Encourage patient to  have regular well-balanced meals while controlling carbohydrate portion sizes - Recommend to continue to check glucose, keep log of results and have this record to review during upcoming medical appointments - Remind patient to follow up with [employer] patient assistance program as needed for refills of Rybelsus                   Follow Up Plan: Schedule follow up for patient to speak with Clinical Pharmacist Peyton Ferries by telephone on 05/21/2024 at 1:00 PM        Sharyle Sia, PharmD, [facility] [phone]

## 2024-03-22 ENCOUNTER — Other Ambulatory Visit: Payer: Self-pay | Admitting: Family Medicine

## 2024-03-22 ENCOUNTER — Ambulatory Visit (INDEPENDENT_AMBULATORY_CARE_PROVIDER_SITE_OTHER)

## 2024-03-22 DIAGNOSIS — Z23 Encounter for immunization: Secondary | ICD-10-CM | POA: Diagnosis not present

## 2024-03-22 NOTE — Progress Notes (Signed)
 Patient is in office today for a nurse visit for Flu Immunization. Patient Injection was given in the  Left deltoid. Patient tolerated injection well.

## 2024-03-23 LAB — HEPATIC FUNCTION PANEL
AG Ratio: 1.4 (calc) (ref 1.0–2.5)
ALT: 25 U/L (ref 6–29)
AST: 44 U/L — ABNORMAL HIGH (ref 10–35)
Albumin: 4 g/dL (ref 3.6–5.1)
Alkaline phosphatase (APISO): 104 U/L (ref 37–153)
Bilirubin, Direct: 0.1 mg/dL (ref 0.0–0.2)
Globulin: 2.9 g/dL (ref 1.9–3.7)
Indirect Bilirubin: 0.3 mg/dL (ref 0.2–1.2)
Total Bilirubin: 0.4 mg/dL (ref 0.2–1.2)
Total Protein: 6.9 g/dL (ref 6.1–8.1)

## 2024-03-26 ENCOUNTER — Other Ambulatory Visit: Payer: Self-pay | Admitting: Family Medicine

## 2024-03-26 NOTE — Telephone Encounter (Unsigned)
 Copied from CRM #[id number]. Topic: Clinical - Medication Refill >> Mar 26, 2024  9:14 AM Vena HERO wrote: Medication: metFORMIN  (GLUCOPHAGE -XR) 500 MG 24 hr tablet  Has the patient contacted their pharmacy? Yes (Agent: If no, request that the patient contact the pharmacy for the refill. If patient does not wish to contact the pharmacy document the reason why and proceed with request.) (Agent: If yes, when and what did the pharmacy advise?) Refill was denied, call provider  This is the patient's preferred pharmacy:  [facility] [address] ([address]), [place] - [address] [place]) [place] [address] Phone: [phone] Fax: [phone]   Is this the correct pharmacy for this prescription? Yes If no, delete pharmacy and type the correct one.   Has the prescription been filled recently? No  Is the patient out of the medication? No  Has the patient been seen for an appointment in the last year OR does the patient have an upcoming appointment? Yes last seen 9/8 next appt 06/2024  Can we respond through MyChart? Yes  Agent: Please be advised that Rx refills may take up to 3 business days. We ask that you follow-up with your pharmacy.

## 2024-03-27 MED ORDER — METFORMIN HCL ER 500 MG PO TB24: 500.0000 mg | ORAL_TABLET | Freq: Every day | ORAL | 0 refills | Status: DC

## 2024-03-27 NOTE — Telephone Encounter (Signed)
 [facility] called and spoke to [place], [facility] about the refill(s) metformin  requested. Advised it was sent on 10/13/23 #90/1 refill(s). He says it was filled on 10/13/23 and again on 12/20/23, so there are no more refills available. Advised I will send in the refill.   Requested Prescriptions  Pending Prescriptions Disp Refills   metFORMIN  (GLUCOPHAGE -XR) 500 MG 24 hr tablet 90 tablet 1    Sig: Take 1 tablet (500 mg total) by mouth daily with breakfast.     Endocrinology:  Diabetes - Biguanides Failed - 03/27/2024  5:14 PM      Failed - B12 Level in normal range and within 720 days    Vitamin B-12  Date Value Ref Range Status  02/13/2024 >2,000 (H) 200 - 1,100 pg/mL Final         Passed - Cr in normal range and within 360 days    Creat  Date Value Ref Range Status  02/13/2024 0.96 0.60 - 1.00 mg/dL Final   Creatinine, Urine  Date Value Ref Range Status  02/13/2024 161 20 - 275 mg/dL Final         Passed - HBA1C is between 0 and 7.9 and within 180 days    Hemoglobin A1C  Date Value Ref Range Status  02/13/2024 5.9 (A) 4.0 - 5.6 % Final  09/04/2015 6.8  Final   HbA1c, POC (prediabetic range)  Date Value Ref Range Status  11/17/2017 6.2 5.7 - 6.4 % Final   HbA1c, POC (controlled diabetic range)  Date Value Ref Range Status  07/30/2019 6.5 0.0 - 7.0 % Final   Hgb A1c MFr Bld  Date Value Ref Range Status  07/17/2021 7.0 (H) <5.7 % of total Hgb Final    Comment:    For someone without known diabetes, a hemoglobin A1c value of 6.5% or greater indicates that they may have  diabetes and this should be confirmed with a follow-up  test. . For someone with known diabetes, a value <7% indicates  that their diabetes is well controlled and a value  greater than or equal to 7% indicates suboptimal  control. A1c targets should be individualized based on  duration of diabetes, age, comorbid conditions, and  other considerations. . Currently, no consensus exists regarding use  of hemoglobin A1c for diagnosis of diabetes for children. .          Passed - eGFR in normal range and within 360 days    GFR, Est African American  Date Value Ref Range Status  01/29/2020 67 > OR = 60 mL/min/1.70m2 Final   GFR, Est Non African American  Date Value Ref Range Status  01/29/2020 58 (L) > OR = 60 mL/min/1.63m2 Final   eGFR  Date Value Ref Range Status  02/13/2024 61 > OR = 60 mL/min/1.27m2 Final         Passed - Valid encounter within last 6 months    Recent Outpatient Visits           1 month ago Dyslipidemia associated with type 2 diabetes mellitus [facility])   [facility] [place], [place], MD   5 months ago Hypertension associated with type 2 diabetes mellitus [facility])   [place] [place], Krichna, MD              Passed - CBC within normal limits and completed in the last 12 months    WBC  Date Value Ref Range Status  02/13/2024 5.8 3.8 - 10.8 Thousand/uL  Final   RBC  Date Value Ref Range Status  02/13/2024 4.59 3.80 - 5.10 Million/uL Final   Hemoglobin  Date Value Ref Range Status  02/13/2024 14.0 11.7 - 15.5 g/dL Final   HCT  Date Value Ref Range Status  02/13/2024 43.6 35.0 - 45.0 % Final   MCHC  Date Value Ref Range Status  02/13/2024 32.1 32.0 - 36.0 g/dL Final    Comment:    For adults, a slight decrease in the calculated MCHC value (in the range of 30 to 32 g/dL) is most likely not clinically significant; however, it should be interpreted with caution in correlation with other red cell parameters and the patient's clinical condition.    [facility]  Date Value Ref Range Status  02/13/2024 30.5 27.0 - 33.0 pg Final   MCV  Date Value Ref Range Status  02/13/2024 95.0 80.0 - 100.0 fL Final   No results found for: PLTCOUNTKUC, LABPLAT, POCPLA RDW  Date Value Ref Range Status  02/13/2024 12.8 11.0 - 15.0 % Final

## 2024-03-28 ENCOUNTER — Ambulatory Visit

## 2024-04-20 ENCOUNTER — Encounter: Payer: Self-pay | Admitting: Family Medicine

## 2024-05-21 ENCOUNTER — Other Ambulatory Visit (INDEPENDENT_AMBULATORY_CARE_PROVIDER_SITE_OTHER)

## 2024-05-21 DIAGNOSIS — Z7984 Long term (current) use of oral hypoglycemic drugs: Secondary | ICD-10-CM

## 2024-05-21 DIAGNOSIS — E1129 Type 2 diabetes mellitus with other diabetic kidney complication: Secondary | ICD-10-CM

## 2024-05-21 NOTE — Progress Notes (Signed)
 S:     Reason for visit: ?  Megan Short is a 78 y.o. female with a history of diabetes (type 2), who presents today for a follow up diabetes Telephone pharmacotherapy visit.? Pertinent PMH also includes HTN, hypothyroidism, CKD stage 3a, HLD, depression.  Care Team: Primary Care Provider: Sowles, Krichna, MD  Since last visit, patient contacted PCP with concerns of weight loss. She was instructed to hold metformin  and re-evaluate at follow up.  Today, patient reported she did not see these instructions and had still been taking metformin . She denies any additional weight loss.   Current diabetes medications include: Rybelsus  7 mg daily, metformin  XR 500 mg daily  Previous diabetes medications include: glipizide   (hypoglycemia), pioglitazone  Current hypertension medications include: losartan  100 mg daily  Current hyperlipidemia medications include: atorvastatin  80 mg daily   Patient reports adherence to taking all medications as prescribed.   Have you been experiencing any side effects to the medications prescribed? Yes - weight loss with Rybelsus  Do you have any problems obtaining medications due to transportation or finances? no Insurance coverage: UHC Medicare  *switching to [facility] in 2026  Patient denies hypoglycemic events.  Reported home fasting blood sugars: 99, 103, 85, 90, 84, 139, 88, 90, 92, 100, 95, 83, 92, 94  Average: 95 mg/dL  Patient-reported exercise habits: treadmill, rowing machine, weights, elliptical - twice a day 3-4 days a week DM Prevention:  Statin: Taking; high intensity.?  ACE/ARB: yes; losartan  History of chronic kidney disease? yes Last urinary albumin/creatinine ratio:  Lab Results  Component Value Date   MICRALBCREAT 10 02/13/2024   MICRALBCREAT 15 01/26/2023   MICRALBCREAT 50 (H) 01/14/2022   MICRALBCREAT 40 (H) 01/07/2021   MICRALBCREAT 27 01/29/2020   MICRALBCREAT 9 03/07/2019   MICRALBCREAT 29 03/21/2018   MICRALBCREAT 14 12/14/2016    Last eye exam:  Lab Results  Component Value Date   HMDIABEYEEXA No Retinopathy 08/09/2023   Lab Results  Component Value Date   HMDIABEYEEXA No Retinopathy 08/09/2023   Last foot exam: No foot exam found Tobacco Use:  Tobacco Use: Low Risk (02/13/2024)   Patient History    Smoking Tobacco Use: Never    Smokeless Tobacco Use: Never    Passive Exposure: Not on file   O:   Vitals:  Wt Readings from Last 3 Encounters:  02/13/24 130 lb 6.4 oz (59.1 kg)  10/13/23 128 lb (58.1 kg)  05/27/23 132 lb 6.4 oz (60.1 kg)   BP Readings from Last 3 Encounters:  02/13/24 110/74  10/13/23 112/70  05/27/23 118/74   Pulse Readings from Last 3 Encounters:  02/13/24 86  10/13/23 93  05/27/23 97     Labs:?  Lab Results  Component Value Date   HGBA1C 5.9 (A) 02/13/2024   HGBA1C 5.7 (A) 10/13/2023   HGBA1C 5.8 (A) 05/27/2023   GLUCOSE 85 02/13/2024   MICRALBCREAT 10 02/13/2024   MICRALBCREAT 15 01/26/2023   MICRALBCREAT 50 (H) 01/14/2022   CREATININE 0.96 02/13/2024   CREATININE 1.06 (H) 01/26/2023   CREATININE 1.24 (H) 09/29/2022    Lab Results  Component Value Date   CHOL 122 02/13/2024   LDLCALC 69 02/13/2024   LDLCALC 78 01/26/2023   LDLCALC 55 01/14/2022   HDL 38 (L) 02/13/2024   TRIG 73 02/13/2024   TRIG 76 01/26/2023   TRIG 106 01/14/2022   ALT 25 03/22/2024   ALT 31 (H) 02/13/2024   AST 44 (H) 03/22/2024   AST 51 (H) 02/13/2024  Chemistry      Component Value Date/Time   NA 140 02/13/2024 1143   NA 140 09/04/2015 1023   NA 137 01/01/2014 1315   K 5.1 02/13/2024 1143   K 3.7 01/01/2014 1315   CL 105 02/13/2024 1143   CL 103 01/01/2014 1315   CO2 27 02/13/2024 1143   CO2 27 01/01/2014 1315   BUN 21 02/13/2024 1143   BUN 21 09/04/2015 1023   BUN 16 01/01/2014 1315   CREATININE 0.96 02/13/2024 1143   GLU 153 09/04/2015 1023      Component Value Date/Time   CALCIUM  10.4 02/13/2024 1143   CALCIUM  9.2 01/01/2014 1315   ALKPHOS 125 12/14/2016 1119    AST 44 (H) 03/22/2024 1353   ALT 25 03/22/2024 1353   BILITOT 0.4 03/22/2024 1353       The ASCVD Risk score (Arnett DK, et al., 2019) failed to calculate for the following reasons:   The valid total cholesterol range is 130 to 320 mg/dL  Lab Results  Component Value Date   MICRALBCREAT 10 02/13/2024   MICRALBCREAT 15 01/26/2023   MICRALBCREAT 50 (H) 01/14/2022   MICRALBCREAT 40 (H) 01/07/2021   MICRALBCREAT 27 01/29/2020   MICRALBCREAT 9 03/07/2019   MICRALBCREAT 29 03/21/2018   MICRALBCREAT 14 12/14/2016    A/P: Diabetes currently controlled with a most recent A1c of 5.9% on 02/13/24. Patient is able to verbalize appropriate hypoglycemia management plan. Patient reports concern regarding weight loss and was previously advised to hold metformin  until follow-up; however, patient reports continued metformin  use in the interim. [employer] will no longer be offering Rybelsus  to Medicare beneficiaries through Patient Assistance Program in 2026. Patient has about 4 months of Rybelsus  at home. Will check on cost on new insurance plan in 2026. May have to switch to a DPP4i and/or SGLT2i agent via PAP if not financially feasible.  -Continued GLP-1 Rybelsus  (semaglutide ) 7 mg daily -Discontinued metformin  XR 500 mg daily as previously instructed.  -Extensively discussed pathophysiology of diabetes, recommended lifestyle interventions, dietary effects on blood sugar control.  -Counseled on s/sx of and management of hypoglycemia.   ASCVD risk - primary prevention in patient with diabetes. Last LDL is 69 mg/dL, at goal of <29 mg/dL. -Continued atorvastatin  80 mg daily.   Patient verbalized understanding of treatment plan. Total time patient counseling 45 minutes.  Follow-up:  Pharmacist on 07/23/24 PCP clinic visit on 06/15/24  Peyton [name], PharmD, CPP Clinical Pharmacist [facility] [phone]

## 2024-06-14 ENCOUNTER — Ambulatory Visit: Payer: Self-pay

## 2024-06-14 DIAGNOSIS — Z0000 Encounter for general adult medical examination without abnormal findings: Secondary | ICD-10-CM | POA: Diagnosis not present

## 2024-06-14 NOTE — Patient Instructions (Addendum)
 Megan Short,  Thank you for taking the time for your Medicare Wellness Visit. I appreciate your continued commitment to your health goals. Please review the care plan we discussed, and feel free to reach out if I can assist you further.  Please note that Annual Wellness Visits do not include a physical exam. Some assessments may be limited, especially if the visit was conducted virtually. If needed, we may recommend an in-person follow-up with your provider.  Ongoing Care Seeing your primary care provider every 3 to 6 months helps us  monitor your health and provide consistent, personalized care. APPT W/ DR.SOWLES 06/15/24 @ 10:20 AM  Referrals If a referral was made during today's visit and you haven't received any updates within two weeks, please contact the referred provider directly to check on the status.  Recommended Screenings:  Health Maintenance  Topic Date Due   Eye exam for diabetics  08/08/2024   Hemoglobin A1C  08/12/2024   COVID-19 Vaccine (9 - Pfizer risk 2025-26 season) 10/21/2024   Breast Cancer Screening  12/20/2024   Yearly kidney function blood test for diabetes  02/12/2025   Yearly kidney health urinalysis for diabetes  02/12/2025   Complete foot exam   02/12/2025   Medicare Annual Wellness Visit  06/14/2025   Osteoporosis screening with Bone Density Scan  12/21/2026   DTaP/Tdap/Td vaccine (3 - Td or Tdap) 08/01/2030   Pneumococcal Vaccine for age over 53  Completed   Flu Shot  Completed   Hepatitis C Screening  Completed   Zoster (Shingles) Vaccine  Completed   Meningitis B Vaccine  Aged Out   Colon Cancer Screening  Discontinued     Vision: Annual vision screenings are recommended for early detection of glaucoma, cataracts, and diabetic retinopathy. These exams can also reveal signs of chronic conditions such as diabetes and high blood pressure.  Dental: Annual dental screenings help detect early signs of oral cancer, gum disease, and other conditions linked to  overall health, including heart disease and diabetes.  Please see the attached documents for additional preventive care recommendations.   NEXT AWV 06/20/25 @ 8:50 AM BY VIDEO

## 2024-06-14 NOTE — Progress Notes (Signed)
 "  Chief Complaint  Patient presents with   Medicare Wellness     Subjective:   Megan Short is a 79 y.o. female who presents for a Medicare Annual Wellness Visit.  Visit info / Clinical Intake: Medicare Wellness Visit Type:: Subsequent Annual Wellness Visit Persons participating in visit and providing information:: patient Medicare Wellness Visit Mode:: Telephone If telephone:: video error Since this visit was completed virtually, some vitals may be partially provided or unavailable. Missing vitals are due to the limitations of the virtual format.: Unable to obtain vitals - no equipment If Telephone or Video please confirm:: I connected with patient using audio/video enable telemedicine. I verified patient identity with two identifiers, discussed telehealth limitations, and patient agreed to proceed. Patient Location:: HOME Provider Location:: OFFICE Interpreter Needed?: No Pre-visit prep was completed: yes AWV questionnaire completed by patient prior to visit?: no Living arrangements:: lives with spouse/significant other Patient's Overall Health Status Rating: very good Typical amount of pain: none Does pain affect daily life?: no Are you currently prescribed opioids?: no  Dietary Habits and Nutritional Risks How many meals a day?: 2 Eats fruit and vegetables daily?: yes Most meals are obtained by: preparing own meals In the last 2 weeks, have you had any of the following?: none Diabetic:: (!) yes Any non-healing wounds?: no How often do you check your BS?: 1 (EVERY MORNING) Would you like to be referred to a Nutritionist or for Diabetic Management? : no  Functional Status Activities of Daily Living (to include ambulation/medication): Independent Ambulation: Independent Medication Administration: Independent Home Management (perform basic housework or laundry): Independent Manage your own finances?: yes Primary transportation is: driving Concerns about vision?: no *vision  screening is required for [facility]* (WEARS GLASSES- DR.BRASINGTON- YEARLY) Concerns about hearing?: no  Fall Screening Falls in the past year?: 0 Number of falls in past year: 0 Was there an injury with Fall?: 0 Fall Risk Category Calculator: 0 Patient Fall Risk Level: Low Fall Risk  Fall Risk Patient at Risk for Falls Due to: No Fall Risks Fall risk Follow up: Falls evaluation completed; Falls prevention discussed  Home and Transportation Safety: All rugs have non-skid backing?: yes All stairs or steps have railings?: yes Grab bars in the bathtub or shower?: yes Have non-skid surface in bathtub or shower?: yes Good home lighting?: yes Regular seat belt use?: yes Hospital stays in the last year:: no  Cognitive Assessment Difficulty concentrating, remembering, or making decisions? : no Will 6CIT or Mini Cog be Completed: yes What year is it?: 0 points What month is it?: 0 points Give patient an address phrase to remember (5 components): [address] [place], [place] About what time is it?: 0 points Count backwards from 20 to 1: 0 points Say the months of the year in reverse: 0 points Repeat the address phrase from earlier: 0 points 6 CIT Score: 0 points  Advance Directives (For Healthcare) Does Patient Have a Medical Advance Directive?: No Would patient like information on creating a medical advance directive?: No - Patient declined  Reviewed/Updated  Reviewed/Updated: Reviewed All (Medical, Surgical, Family, Medications, Allergies, Care Teams, Patient Goals)    Allergies (verified) Sulfa antibiotics   Current Medications (verified) Outpatient Encounter Medications as of 06/14/2024  Medication Sig   Accu-Chek Softclix Lancets lancets Use as instructed   atorvastatin  (LIPITOR) 80 MG tablet Take 1 tablet (80 mg total) by mouth every evening.   Blood Glucose Calibration (ACCU-CHEK GUIDE CONTROL) LIQD 1 each by In Vitro route once for  1 dose.   blood glucose meter kit and  supplies Dispense based on patient and insurance preference. Check bid daily as directed. (FOR ICD-10 E10.9, E11.9).   cholecalciferol (VITAMIN D3) 25 MCG (1000 UNIT) tablet Take 1 tablet (1,000 Units total) by mouth daily.   citalopram  (CELEXA ) 40 MG tablet Take 1 tablet (40 mg total) by mouth daily.   clonazePAM  (KLONOPIN ) 0.5 MG tablet Take 1 tablet (0.5 mg total) by mouth daily as needed.   glucose blood (ACCU-CHEK GUIDE TEST) test strip TEST BLOOD SUGAR TWICE DAILY AS NEEDED   loratadine  (CLARITIN ) 10 MG tablet Take 1 tablet (10 mg total) by mouth daily. (Patient taking differently: Take 10 mg by mouth daily. Takes PRN)   losartan  (COZAAR ) 100 MG tablet Take 1 tablet (100 mg total) by mouth daily.   Semaglutide  7 MG TABS Take 1 tablet by mouth daily. Take on empty stomach, 30 minutes before the first food, beverage, or other oral medications of the day with 4 oz of plain water only   terbinafine  (LAMISIL ) 250 MG tablet Take 1 tablet (250 mg total) by mouth daily.   vitamin B-12 (CYANOCOBALAMIN ) 100 MCG tablet Take 100 mcg by mouth.   levothyroxine  (SYNTHROID ) 75 MCG tablet TAKE 1 TABLET BY MOUTH ONCE DAILY BEFORE BREAKFAST (Patient taking differently: Take 75 mcg by mouth daily before breakfast. Taking 1/2 tablet on Sundays and 1 tablet all other days of the week)   tacrolimus  (PROTOPIC ) 0.1 % ointment Apply to affected areas once to twice daily as directed. (Patient not taking: Reported on 06/14/2024)   No facility-administered encounter medications on file as of 06/14/2024.    History: Past Medical History:  Diagnosis Date   Anxiety    Depression    Diabetes mellitus without complication (HCC)    Hyperlipidemia    Hypertension    Hypothyroidism    Past Surgical History:  Procedure Laterality Date   BREAST BIOPSY Right    negative times 2   COLONOSCOPY WITH PROPOFOL  N/A 01/20/2015   Procedure: COLONOSCOPY WITH PROPOFOL ;  Surgeon: Lamar ONEIDA Holmes, MD;  Location: [facility] ENDOSCOPY;   Service: Endoscopy;  Laterality: N/A;   COLONOSCOPY WITH PROPOFOL  N/A 05/07/2020   Procedure: COLONOSCOPY WITH PROPOFOL ;  Surgeon: Toledo, Ladell POUR, MD;  Location: [facility] ENDOSCOPY;  Service: Gastroenterology;  Laterality: N/A;   LITHOTRIPSY Left 12/2013   Family History  Problem Relation Age of Onset   Osteoarthritis Mother    Kidney failure Mother    Failure to thrive Mother    Osteoarthritis Sister    Alzheimer's disease Maternal Aunt    Breast cancer Neg Hx    Social History   Occupational History   Occupation: retired     Comment: clinical biochemist rep   Tobacco Use   Smoking status: Never   Smokeless tobacco: Never   Tobacco comments:    Smoking cessation materials not required  Vaping Use   Vaping status: Never Used  Substance and Sexual Activity   Alcohol  use: No    Alcohol /week: 0.0 standard drinks of alcohol    Drug use: No   Sexual activity: Not Currently   Tobacco Counseling Counseling given: Not Answered Tobacco comments: Smoking cessation materials not required  SDOH Screenings   Food Insecurity: No Food Insecurity (06/14/2024)  Housing: Unknown (06/14/2024)  Transportation Needs: No Transportation Needs (06/14/2024)  Utilities: Not At Risk (06/14/2024)  Alcohol  Screen: Low Risk (05/26/2023)  Depression (PHQ2-9): Low Risk (06/14/2024)  Financial Resource Strain: Low Risk (05/26/2023)  Physical Activity: Sufficiently  Active (06/14/2024)  Social Connections: Moderately Integrated (06/14/2024)  Stress: No Stress Concern Present (06/14/2024)  Tobacco Use: Low Risk (06/14/2024)  Health Literacy: Adequate Health Literacy (06/14/2024)   See flowsheets for full screening details  Depression Screen PHQ 2 & 9 Depression Scale- Over the past 2 weeks, how often have you been bothered by any of the following problems? Little interest or pleasure in doing things: 0 Feeling down, depressed, or hopeless (PHQ Adolescent also includes...irritable): 0 PHQ-2 Total Score: 0 Trouble falling  or staying asleep, or sleeping too much: 0 Feeling tired or having little energy: 0 Poor appetite or overeating (PHQ Adolescent also includes...weight loss): 0 Feeling bad about yourself - or that you are a failure or have let yourself or your family down: 0 Trouble concentrating on things, such as reading the newspaper or watching television (PHQ Adolescent also includes...like school work): 0 Moving or speaking so slowly that other people could have noticed. Or the opposite - being so fidgety or restless that you have been moving around a lot more than usual: 0 Thoughts that you would be better off dead, or of hurting yourself in some way: 0 PHQ-9 Total Score: 0 If you checked off any problems, how difficult have these problems made it for you to do your work, take care of things at home, or get along with other people?: Not difficult at all  Depression Treatment Depression Interventions/Treatment : EYV7-0 Score <4 Follow-up Not Indicated     Goals Addressed             This Visit's Progress    Cut out extra servings               Objective:    There were no vitals filed for this visit. There is no height or weight on file to calculate BMI.  Hearing/Vision screen Hearing Screening - Comments:: NO AIDS Vision Screening - Comments:: WEARS GLASSES- DR.BRASINGTON- YEARLY Immunizations and Health Maintenance Health Maintenance  Topic Date Due   OPHTHALMOLOGY EXAM  08/08/2024   HEMOGLOBIN A1C  08/12/2024   COVID-19 Vaccine (9 - Pfizer risk 2025-26 season) 10/21/2024   Mammogram  12/20/2024   Diabetic kidney evaluation - eGFR measurement  02/12/2025   Diabetic kidney evaluation - Urine ACR  02/12/2025   FOOT EXAM  02/12/2025   Medicare Annual Wellness (AWV)  06/14/2025   DTaP/Tdap/Td (3 - Td or Tdap) 08/01/2030   Pneumococcal Vaccine: 50+ Years  Completed   Influenza Vaccine  Completed   Bone Density Scan  Completed   Hepatitis C Screening  Completed   Zoster Vaccines-  Shingrix   Completed   Meningococcal B Vaccine  Aged Out   Colonoscopy  Discontinued        Assessment/Plan:  This is a routine wellness examination for Kiana.  Patient Care Team: Sowles, Krichna, MD as PCP - General (Family Medicine) Loreda Hacker, DPM as Consulting Physician (Podiatry) Demetria Race, MD as Consulting Physician (Dermatology) Mittie Gaskin, MD as Referring Physician (Ophthalmology) Marsh, Allyson E, RPH-CPP as Pharmacist  I have personally reviewed and noted the following in the patients chart:   Medical and social history Use of alcohol , tobacco or illicit drugs  Current medications and supplements including opioid prescriptions. Functional ability and status Nutritional status Physical activity Advanced directives List of other physicians Hospitalizations, surgeries, and ER visits in previous 12 months Vitals Screenings to include cognitive, depression, and falls Referrals and appointments  No orders of the defined types were placed in this encounter.  In addition, I have reviewed and discussed with patient certain preventive protocols, quality metrics, and best practice recommendations. A written personalized care plan for preventive services as well as general preventive health recommendations were provided to patient.   Jhonnie GORMAN Das, LPN   01/06/7972   Return in 1 year (on 06/14/2025).  After Visit Summary: (MyChart) Due to this being a telephonic visit, the after visit summary with patients personalized plan was offered to patient via MyChart   Nurse Notes: UTD ON SHOTS, UTD ON MAMMOGRAM & BDS; AGED OUT OF COLONOSCOPY   "

## 2024-06-15 ENCOUNTER — Ambulatory Visit: Admitting: Family Medicine

## 2024-06-15 ENCOUNTER — Encounter: Payer: Self-pay | Admitting: Family Medicine

## 2024-06-15 VITALS — BP 124/72 | HR 86 | Resp 16 | Ht 59.5 in | Wt 128.6 lb

## 2024-06-15 DIAGNOSIS — E785 Hyperlipidemia, unspecified: Secondary | ICD-10-CM

## 2024-06-15 DIAGNOSIS — E039 Hypothyroidism, unspecified: Secondary | ICD-10-CM

## 2024-06-15 DIAGNOSIS — E1159 Type 2 diabetes mellitus with other circulatory complications: Secondary | ICD-10-CM

## 2024-06-15 DIAGNOSIS — F325 Major depressive disorder, single episode, in full remission: Secondary | ICD-10-CM

## 2024-06-15 DIAGNOSIS — E1129 Type 2 diabetes mellitus with other diabetic kidney complication: Secondary | ICD-10-CM

## 2024-06-15 DIAGNOSIS — E538 Deficiency of other specified B group vitamins: Secondary | ICD-10-CM | POA: Insufficient documentation

## 2024-06-15 DIAGNOSIS — E1169 Type 2 diabetes mellitus with other specified complication: Secondary | ICD-10-CM | POA: Insufficient documentation

## 2024-06-15 DIAGNOSIS — E559 Vitamin D deficiency, unspecified: Secondary | ICD-10-CM

## 2024-06-15 DIAGNOSIS — B351 Tinea unguium: Secondary | ICD-10-CM

## 2024-06-15 DIAGNOSIS — I129 Hypertensive chronic kidney disease with stage 1 through stage 4 chronic kidney disease, or unspecified chronic kidney disease: Secondary | ICD-10-CM

## 2024-06-15 DIAGNOSIS — Z7984 Long term (current) use of oral hypoglycemic drugs: Secondary | ICD-10-CM

## 2024-06-15 DIAGNOSIS — N1831 Chronic kidney disease, stage 3a: Secondary | ICD-10-CM | POA: Insufficient documentation

## 2024-06-15 DIAGNOSIS — E1122 Type 2 diabetes mellitus with diabetic chronic kidney disease: Secondary | ICD-10-CM

## 2024-06-15 LAB — POCT GLYCOSYLATED HEMOGLOBIN (HGB A1C): Hemoglobin A1C: 5.4 % (ref 4.0–5.6)

## 2024-06-15 MED ORDER — LOSARTAN POTASSIUM 100 MG PO TABS: 100.0000 mg | ORAL_TABLET | Freq: Every day | ORAL | 1 refills | Status: AC

## 2024-06-15 MED ORDER — CITALOPRAM HYDROBROMIDE 40 MG PO TABS: 40.0000 mg | ORAL_TABLET | Freq: Every day | ORAL | 1 refills | Status: AC

## 2024-06-15 NOTE — Progress Notes (Signed)
 Name: Megan Short   MRN: [id number]    DOB: [birth year]   Date:06/15/2024       Progress Note  Subjective  Chief Complaint  Chief Complaint  Patient presents with   Medical Management of Chronic Issues   Discussed the use of AI scribe software for clinical note transcription with the patient, who gave verbal consent to proceed.  History of Present Illness Megan Short is a 79 year old female with type 2 diabetes and chronic kidney disease who presents for a follow-up visit.  Her A1c has improved from 5.9 to 5.4. She is currently taking Rybelsus  7 mg daily for her type 2 diabetes and has stopped taking metformin  since December 16th due to episodes of hypoglycemia. No longer experiencing hypoglycemia, shakiness, or fatigue since discontinuing metformin . Her appetite has improved, and she maintains a stable weight of approximately 128 pounds.  She has a history of chronic kidney disease associated with type 2 diabetes and hypertension. She is taking losartan  100 mg daily for hypertension. No chest pain, palpitations, or orthostatic lightheadedness.  For dyslipidemia, she is on atorvastatin  80 mg daily and reports no myalgia or other side effects.  She has adult hypothyroidism and is taking levothyroxine  75 mcg daily with an additional half tablet on Sundays. Her last TSH was checked in June. No alopecia, dysphagia, or changes in bowel movements.  She has onychomycosis and previously took Lamisil  but stopped due to nausea. She took about 60 days of the medication   She takes a B12 supplement three times a week, as her levels were high in September.  She has a long history of major depression and is currently taking citalopram  40 mg daily. For anxiety, she uses clonazepam  as needed, primarily for sleep disturbances or when feeling particularly anxious.  No significant changes in her social history.    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   Calculus of kidney 09/07/2014   Memory loss or impairment 09/07/2014    Past Surgical History:  Procedure Laterality Date   BREAST BIOPSY Right    negative times 2   COLONOSCOPY WITH PROPOFOL  N/A 01/20/2015   Procedure: COLONOSCOPY WITH PROPOFOL ;  Surgeon: Lamar ONEIDA Holmes, MD;  Location: [facility] ENDOSCOPY;  Service: Endoscopy;  Laterality: N/A;   COLONOSCOPY WITH PROPOFOL  N/A 05/07/2020   Procedure: COLONOSCOPY WITH PROPOFOL ;  Surgeon: Toledo, Ladell POUR, MD;  Location: [facility] ENDOSCOPY;  Service: Gastroenterology;  Laterality: N/A;   LITHOTRIPSY Left 12/2013    Family History  Problem Relation Age of Onset   Osteoarthritis Mother    Kidney failure Mother    Failure to thrive Mother    Osteoarthritis Sister    Alzheimer's disease Maternal Aunt    Breast cancer Neg Hx     Social History   Tobacco Use   Smoking status: Never   Smokeless tobacco: Never   Tobacco comments:    Smoking cessation materials not required  Substance Use Topics   Alcohol  use: No    Alcohol /week: 0.0 standard drinks of alcohol     Current Medications[1]  Allergies[2]  I personally reviewed active problem list, medication list, allergies, family history with the patient/caregiver today.   ROS  Ten systems reviewed and is negative except as mentioned in HPI    Objective Physical Exam  CONSTITUTIONAL: Patient appears well-developed and well-nourished.  No distress. HEENT: Head atraumatic, normocephalic, neck supple. CARDIOVASCULAR: Normal  rate, regular rhythm and normal heart sounds.  No murmur heard. No BLE edema. PULMONARY: Effort normal and breath sounds normal. No respiratory distress. ABDOMINAL: There is no tenderness or distention. MUSCULOSKELETAL: Normal gait. Without gross motor or sensory deficit. PSYCHIATRIC: Patient has a normal mood and affect. behavior is normal. Judgment and thought content normal.  Vitals:   06/15/24 0948  BP: 124/72  Pulse: 86  Resp: 16  SpO2: 99%  Weight: 128 lb 9.6 oz (58.3 kg)  Height: 4' 11.5 (1.511 m)    Body mass index is 25.54 kg/m.  Recent Results (from the past 2160 hours)  Hepatic function panel     Status: Abnormal   Collection Time: 03/22/24  1:53 PM  Result Value Ref Range   Total Protein 6.9 6.1 - 8.1 g/dL   Albumin 4.0 3.6 - 5.1 g/dL   Globulin 2.9 1.9 - 3.7 g/dL (calc)   AG Ratio 1.4 1.0 - 2.5 (calc)   Total Bilirubin 0.4 0.2 - 1.2 mg/dL   Bilirubin, Direct 0.1 0.0 - 0.2 mg/dL   Indirect Bilirubin 0.3 0.2 - 1.2 mg/dL (calc)   Alkaline phosphatase (APISO) 104 37 - 153 U/L   AST 44 (H) 10 - 35 U/L   ALT 25 6 - 29 U/L     PHQ2/9:    06/15/2024    9:43 AM 06/14/2024   10:32 AM 02/13/2024   10:20 AM 10/13/2023   10:28 AM 05/27/2023   11:19 AM  Depression screen PHQ 2/9  Decreased Interest 0 0 0 0 0  Down, Depressed, Hopeless 0 0 0 0 0  PHQ - 2 Score 0 0 0 0 0  Altered sleeping 0 0 0 0 0  Tired, decreased energy 0 0 0 0 0  Change in appetite 0 0 0 0 0  Feeling bad or failure about yourself  0 0 0 0 0  Trouble concentrating 0 0 0 0 0  Moving slowly or fidgety/restless 0 0 0 0 0  Suicidal thoughts 0 0 0 0 0  PHQ-9 Score 0 0 0  0  0   Difficult doing work/chores Not difficult at all Not difficult at all Not difficult at all Not difficult at all Not difficult at all     Data saved with a previous flowsheet row definition    phq 9 is negative  Fall Risk:    06/15/2024    9:43 AM 06/14/2024   10:23 AM 02/13/2024   10:19 AM 10/13/2023   10:28 AM 05/26/2023    10:54 AM  Fall Risk   Falls in the past year? 0 0 0 0 0  Number falls in past yr: 0 0 0 0 0  Injury with Fall? 0 0 0  0  0   Risk for fall due to : No Fall Risks No Fall Risks No Fall Risks No Fall Risks No Fall Risks  Follow up Falls evaluation completed Falls evaluation completed;Falls prevention discussed Falls evaluation completed Falls prevention discussed;Education provided;Falls evaluation completed Falls prevention discussed;Falls evaluation completed     Data saved with a previous flowsheet row definition    Assessment & Plan Type 2 diabetes mellitus  with stage 3a chronic kidney disease, hypertension, and dyslipidemia Diabetes well-controlled with A1c of 5.4. Metformin  discontinued due to hypoglycemia and no episodes of hypoglycemia since she stopped it mid Dec . Rybelsus  preferred for metabolic benefits. CKD managed with losartan . Hypertension controlled with losartan . Dyslipidemia managed with atorvastatin . - Continue Rybelsus  7 mg daily. - Continue losartan  100 mg daily for kidney protection and hypertension. - Continue atorvastatin  80 mg daily for dyslipidemia. - Monitor blood pressure and kidney function regularly.  Onychomycosis of multiple toenails in a patient with type 2 diabetes mellitus Lamisil  discontinued due to nausea. Alternative treatment with higher dose pulses discussed. Improvement may take up to a year. - Initiate Lamisil  2 tablets daily for 7 days, then stop for 3 weeks, repeat cycle. - Monitor for improvement over the next year.  Adult hypothyroidism Managed with levothyroxine . TSH levels not recently checked. - Ordered TSH test to evaluate thyroid  function. - Continue levothyroxine  75 mcg daily with half a tablet on Sundays. - Will adjust levothyroxine  dose based on TSH results.  Vitamin B12 deficiency B12 levels high in September, supplementation frequency adjusted. - Ordered B12 level test. - Continue B12 supplementation 3 times a week.  Major  depressive disorder in remission In remission with citalopram  and clonazepam  as needed for anxiety. - Continue citalopram  40 mg daily. - Continue clonazepam  as needed for anxiety or sleep disturbances.        [1]  Current Outpatient Medications:    Accu-Chek Softclix Lancets lancets, Use as instructed, Disp: 100 each, Rfl: 12   atorvastatin  (LIPITOR) 80 MG tablet, Take 1 tablet (80 mg total) by mouth every evening., Disp: 90 tablet, Rfl: 1   Blood Glucose Calibration (ACCU-CHEK GUIDE CONTROL) LIQD, 1 each by In Vitro route once for 1 dose., Disp: 1 each, Rfl: 3   blood glucose meter kit and supplies, Dispense based on patient and insurance preference. Check bid daily as directed. (FOR ICD-10 E10.9, E11.9)., Disp: 1 each, Rfl: 0   cholecalciferol (VITAMIN D3) 25 MCG (1000 UNIT) tablet, Take 1 tablet (1,000 Units total) by mouth daily., Disp: 90 tablet, Rfl: 1   citalopram  (CELEXA ) 40 MG tablet, Take 1 tablet (40 mg total) by mouth daily., Disp: 90 tablet, Rfl: 1   clonazePAM  (KLONOPIN ) 0.5 MG tablet, Take 1 tablet (0.5 mg total) by mouth daily as needed., Disp: 30 tablet, Rfl: 0   glucose blood (ACCU-CHEK GUIDE TEST) test strip, TEST BLOOD SUGAR TWICE DAILY AS NEEDED, Disp: 200 strip, Rfl: 0   levothyroxine  (SYNTHROID ) 75 MCG tablet, TAKE 1 TABLET BY MOUTH ONCE DAILY BEFORE BREAKFAST (Patient taking differently: Take 75 mcg by mouth daily before breakfast. Taking 1/2 tablet on Sundays and 1 tablet all other days of the week), Disp: 100 tablet, Rfl: 0   loratadine  (CLARITIN ) 10 MG tablet, Take 1 tablet (10 mg total) by mouth daily. (Patient taking differently: Take 10 mg by mouth daily. Takes PRN), Disp: 90 tablet, Rfl: 1   losartan  (COZAAR ) 100 MG tablet, Take 1 tablet (100 mg total) by mouth daily., Disp: 90 tablet, Rfl: 1   Semaglutide  7 MG TABS, Take 1 tablet by mouth daily. Take on empty stomach, 30 minutes before the first food, beverage, or other oral medications of the day with 4 oz of  plain water only, Disp: , Rfl:    terbinafine  (LAMISIL ) 250 MG tablet, Take 1 tablet (250 mg total) by mouth daily., Disp: 90 tablet, Rfl: 0   vitamin B-12 (CYANOCOBALAMIN ) 100 MCG tablet, Take 100 mcg  by mouth., Disp: , Rfl:    tacrolimus  (PROTOPIC ) 0.1 % ointment, Apply to affected areas once to twice daily as directed. (Patient not taking: Reported on 06/15/2024), Disp: 100 g, Rfl: 1 [2]  Allergies Allergen Reactions   Sulfa Antibiotics

## 2024-06-15 NOTE — Patient Instructions (Signed)
 Take lamisil  two daily for 7 days and off 3 weeks, repeat for 3 rounds

## 2024-06-16 LAB — B12 AND FOLATE PANEL
Folate: 11.3 ng/mL
Vitamin B-12: 1250 pg/mL — ABNORMAL HIGH (ref 200–1100)

## 2024-06-16 LAB — TSH: TSH: 0.64 m[IU]/L (ref 0.40–4.50)

## 2024-06-18 ENCOUNTER — Ambulatory Visit: Payer: Self-pay | Admitting: Family Medicine

## 2024-06-19 ENCOUNTER — Other Ambulatory Visit: Payer: Self-pay | Admitting: Family Medicine

## 2024-06-19 DIAGNOSIS — E039 Hypothyroidism, unspecified: Secondary | ICD-10-CM

## 2024-06-19 MED ORDER — LEVOTHYROXINE SODIUM 75 MCG PO TABS: 75.0000 ug | ORAL_TABLET | Freq: Every day | ORAL | 1 refills | Status: AC

## 2024-07-23 ENCOUNTER — Other Ambulatory Visit

## 2024-10-16 ENCOUNTER — Ambulatory Visit: Admitting: Family Medicine

## 2025-06-20 ENCOUNTER — Ambulatory Visit
